# Patient Record
Sex: Female | Born: 1970 | Race: White | Hispanic: No | Marital: Married | Smoking: Never smoker
Health system: Southern US, Community
[De-identification: ages and names within clinical notes are randomized; demographics above are authoritative.]

## PROBLEM LIST (undated history)

## (undated) DIAGNOSIS — F410 Panic disorder [episodic paroxysmal anxiety] without agoraphobia: Secondary | ICD-10-CM

## (undated) DIAGNOSIS — R42 Dizziness and giddiness: Secondary | ICD-10-CM

## (undated) DIAGNOSIS — G250 Essential tremor: Principal | ICD-10-CM

## (undated) DIAGNOSIS — G2581 Restless legs syndrome: Secondary | ICD-10-CM

## (undated) DIAGNOSIS — F32A Depression, unspecified: Secondary | ICD-10-CM

## (undated) DIAGNOSIS — G43909 Migraine, unspecified, not intractable, without status migrainosus: Secondary | ICD-10-CM

## (undated) DIAGNOSIS — J45909 Unspecified asthma, uncomplicated: Secondary | ICD-10-CM

## (undated) HISTORY — PX: ABLATION ON ENDOMETRIOSIS: SHX5787

## (undated) HISTORY — DX: Unspecified asthma, uncomplicated: J45.909

## (undated) HISTORY — DX: Restless legs syndrome: G25.81

## (undated) HISTORY — PX: MYRINGOTOMY WITH TUBE PLACEMENT: SHX5663

## (undated) HISTORY — DX: Essential tremor: G25.0

## (undated) HISTORY — DX: Migraine, unspecified, not intractable, without status migrainosus: G43.909

## (undated) HISTORY — PX: TONSILLECTOMY: SUR1361

## (undated) HISTORY — DX: Dizziness and giddiness: R42

## (undated) HISTORY — DX: Panic disorder (episodic paroxysmal anxiety): F41.0

## (undated) HISTORY — DX: Depression, unspecified: F32.A

---

## 1997-10-13 ENCOUNTER — Inpatient Hospital Stay (HOSPITAL_COMMUNITY): Admission: AD | Admit: 1997-10-13 | Discharge: 1997-10-16 | Payer: Self-pay | Admitting: Obstetrics & Gynecology

## 1997-10-17 ENCOUNTER — Encounter (HOSPITAL_COMMUNITY): Admission: RE | Admit: 1997-10-17 | Discharge: 1998-01-15 | Payer: Self-pay | Admitting: Obstetrics & Gynecology

## 1998-11-28 ENCOUNTER — Other Ambulatory Visit: Admission: RE | Admit: 1998-11-28 | Discharge: 1998-11-28 | Payer: Self-pay | Admitting: Obstetrics & Gynecology

## 1998-12-10 ENCOUNTER — Other Ambulatory Visit: Admission: RE | Admit: 1998-12-10 | Discharge: 1998-12-10 | Payer: Self-pay | Admitting: Obstetrics & Gynecology

## 1999-11-04 ENCOUNTER — Other Ambulatory Visit: Admission: RE | Admit: 1999-11-04 | Discharge: 1999-11-04 | Payer: Self-pay | Admitting: Obstetrics & Gynecology

## 2000-02-18 ENCOUNTER — Inpatient Hospital Stay (HOSPITAL_COMMUNITY): Admission: AD | Admit: 2000-02-18 | Discharge: 2000-02-18 | Payer: Self-pay | Admitting: Obstetrics and Gynecology

## 2000-04-24 ENCOUNTER — Observation Stay (HOSPITAL_COMMUNITY): Admission: AD | Admit: 2000-04-24 | Discharge: 2000-04-25 | Payer: Self-pay | Admitting: Obstetrics and Gynecology

## 2000-05-21 ENCOUNTER — Inpatient Hospital Stay (HOSPITAL_COMMUNITY): Admission: AD | Admit: 2000-05-21 | Discharge: 2000-05-21 | Payer: Self-pay | Admitting: Obstetrics and Gynecology

## 2000-05-30 ENCOUNTER — Inpatient Hospital Stay (HOSPITAL_COMMUNITY): Admission: AD | Admit: 2000-05-30 | Discharge: 2000-06-01 | Payer: Self-pay | Admitting: Obstetrics & Gynecology

## 2000-07-06 ENCOUNTER — Other Ambulatory Visit: Admission: RE | Admit: 2000-07-06 | Discharge: 2000-07-06 | Payer: Self-pay | Admitting: Obstetrics & Gynecology

## 2001-07-19 ENCOUNTER — Other Ambulatory Visit: Admission: RE | Admit: 2001-07-19 | Discharge: 2001-07-19 | Payer: Self-pay | Admitting: Obstetrics & Gynecology

## 2002-11-28 ENCOUNTER — Inpatient Hospital Stay (HOSPITAL_COMMUNITY): Admission: AD | Admit: 2002-11-28 | Discharge: 2002-11-28 | Payer: Self-pay | Admitting: *Deleted

## 2002-12-08 ENCOUNTER — Inpatient Hospital Stay (HOSPITAL_COMMUNITY): Admission: AD | Admit: 2002-12-08 | Discharge: 2002-12-09 | Payer: Self-pay | Admitting: Obstetrics & Gynecology

## 2003-05-09 ENCOUNTER — Emergency Department (HOSPITAL_COMMUNITY): Admission: EM | Admit: 2003-05-09 | Discharge: 2003-05-09 | Payer: Self-pay | Admitting: *Deleted

## 2003-07-06 ENCOUNTER — Inpatient Hospital Stay (HOSPITAL_COMMUNITY): Admission: RE | Admit: 2003-07-06 | Discharge: 2003-07-09 | Payer: Self-pay | Admitting: Obstetrics & Gynecology

## 2003-08-18 ENCOUNTER — Other Ambulatory Visit: Admission: RE | Admit: 2003-08-18 | Discharge: 2003-08-18 | Payer: Self-pay | Admitting: Obstetrics & Gynecology

## 2004-09-25 ENCOUNTER — Other Ambulatory Visit: Admission: RE | Admit: 2004-09-25 | Discharge: 2004-09-25 | Payer: Self-pay | Admitting: Obstetrics & Gynecology

## 2006-04-13 ENCOUNTER — Emergency Department (HOSPITAL_COMMUNITY): Admission: EM | Admit: 2006-04-13 | Discharge: 2006-04-13 | Payer: Self-pay | Admitting: Family Medicine

## 2006-04-14 ENCOUNTER — Inpatient Hospital Stay (HOSPITAL_COMMUNITY): Admission: EM | Admit: 2006-04-14 | Discharge: 2006-04-17 | Payer: Self-pay | Admitting: Emergency Medicine

## 2010-05-28 ENCOUNTER — Encounter: Payer: Self-pay | Admitting: Family Medicine

## 2010-05-28 ENCOUNTER — Ambulatory Visit (INDEPENDENT_AMBULATORY_CARE_PROVIDER_SITE_OTHER): Payer: 59 | Admitting: Family Medicine

## 2010-05-28 VITALS — BP 108/73 | HR 60 | Temp 97.5°F | Ht 68.0 in | Wt 200.0 lb

## 2010-05-28 DIAGNOSIS — M25579 Pain in unspecified ankle and joints of unspecified foot: Secondary | ICD-10-CM

## 2010-05-28 DIAGNOSIS — M25571 Pain in right ankle and joints of right foot: Secondary | ICD-10-CM

## 2010-05-28 NOTE — Patient Instructions (Signed)
You have a severe ankle sprain. Your ultrasound was negative for a subtle fracture or tendon tears about the ankle. Ice the area for 15 minutes at a time, 3-4 times a day Take aleve 2 tabs twice a day with food for 1 week then as needed. Elevate above the level of your heart when possible Crutches if needed to help with walking Bear weight when tolerated Depending on severity your doctor will place you in an ACE wrap with a walking boot OR a laceup ankle brace to help with stability while you recover from this injury. Come out of the boot/brace twice a day to do Up/down and alphabet exercises 2-3 sets of each. Consider physical therapy for strengthening and balance exercises when you follow up with [place]. If not improving as expected, we may repeat x-rays or consider further testing like an MRI. Follow up with me in 10 days.

## 2010-05-29 ENCOUNTER — Encounter: Payer: Self-pay | Admitting: Family Medicine

## 2010-05-29 DIAGNOSIS — M25571 Pain in right ankle and joints of right foot: Secondary | ICD-10-CM | POA: Insufficient documentation

## 2010-05-29 NOTE — Assessment & Plan Note (Signed)
x-rays at outside facility negative for fracture and ultrasound also negative today for evidence of fracture or tendon rupture.  Clinically consistent with severe ankle sprain as well.  Unable to bear weight comfortably.  Start with cam walker and crutches though instructed to not wear walker at night and to come out of this for icing and to do simple motion exercises daily.  NSAIDs for pain, swelling, and inflammation.  Will see back in 7-10 days for recheck and hopefully transition to ASO with home exercise program vs PT.  See instructions for further.

## 2010-05-29 NOTE — Progress Notes (Signed)
Subjective:    Patient ID: Pamela Little, female    DOB: 07-18-1970, 40 y.o.   MRN: [id number]  HPI 40 yo F here with right ankle injury.  Patient reports she was playing tennis yesterday 05/27/10 when she rolled/rocked her right ankle both ways causing pain on lateral and medial aspects of ankle. Was able to bear weight immediately after and could continue playing tennis though had to stop after several minutes. + Swelling and bruising No prior ankle sprains or injuries. Went to PCP at [facility] and had x-rays that were negative - was given an ASO but it was too large for her. Limping 2/2 pain and swelling.  History reviewed. No pertinent past medical history.  No current outpatient prescriptions on file prior to visit.    History reviewed. No pertinent past surgical history.  No Known Allergies  History   Social History  . Marital Status: Married    Spouse Name: N/A    Number of Children: N/A  . Years of Education: N/A   Occupational History  . Not on file.   Social History Main Topics  . Smoking status: Never Smoker   . Smokeless tobacco: Not on file  . Alcohol Use: No  . Drug Use: Not on file  . Sexually Active: Not on file   Other Topics Concern  . Not on file   Social History Narrative  . No narrative on file    Family History  Problem Relation Age of Onset  . Hypertension Mother   . Heart attack Neg Hx   . Diabetes Neg Hx     BP 108/73  Pulse 60  Temp(Src) 97.5 F (36.4 C) (Oral)  Ht 5\' 8"  (1.727 m)  Wt 200 lb (90.719 kg)  BMI 30.41 kg/m2  Review of Systems See HPI above.    Objective:   Physical Exam Gen: NAD R ankle: Mod swelling and bruising from ankle distally into dorsal foot.  No redness, warmth. TTP greatest over ATFL, ankle joint anteriorly, medial and posterior malleoli.  No TTP fibular head, base 5th, navicular, or elsewhere about foot. Has active range of motion in all planes but moderately decreased in every one.  Strength 5-/5  with internal and external rotation, 5/5 with dorsiflexion and plantarflexion - pain with all motions. 1+ ant drawer compared to negative on left.  Negative talar tilt.  Somewhat difficult exam 2/2 swelling. Negative thompsons, achilles palpated and intact. NVI distally with 2+ dp pulse Able to do toe raise on ground.  L ankle:  FROM without pain, swelling, weakness, or instability.     MSK u/s:  Full right ankle ultrasound performed.  No evidence of bony irregularity, edema overlying cortex, or neovascularity at medial malleolus, talar dome, or lateral malleolus.  Soft tissue edema readily apparent throughout ankle.  Target sign within extensor digitorum sheath.  Peroneal tendons and TDH tendons medially appear intact throughout their courses with increased neovascularity.    Assessment & Plan:  1. Severe right ankle sprain - x-rays at outside facility negative for fracture and ultrasound also negative today for evidence of fracture or tendon rupture.  Clinically consistent with severe ankle sprain as well.  Unable to bear weight comfortably.  Start with cam walker and crutches though instructed to not wear walker at night and to come out of this for icing and to do simple motion exercises daily.  NSAIDs for pain, swelling, and inflammation.  Will see back in 7-10 days for recheck and hopefully transition to  ASO with home exercise program vs PT.  See instructions for further.

## 2010-06-07 ENCOUNTER — Encounter: Payer: Self-pay | Admitting: Family Medicine

## 2010-06-07 ENCOUNTER — Ambulatory Visit (HOSPITAL_BASED_OUTPATIENT_CLINIC_OR_DEPARTMENT_OTHER)
Admission: RE | Admit: 2010-06-07 | Discharge: 2010-06-07 | Disposition: A | Discharge status: Home / Self Care | Payer: 59 | Source: Ambulatory Visit | Attending: Family Medicine | Admitting: Family Medicine

## 2010-06-07 ENCOUNTER — Ambulatory Visit (INDEPENDENT_AMBULATORY_CARE_PROVIDER_SITE_OTHER): Payer: 59 | Admitting: Family Medicine

## 2010-06-07 VITALS — BP 109/71 | HR 55 | Temp 97.4°F | Ht 68.0 in | Wt 200.0 lb

## 2010-06-07 DIAGNOSIS — M25571 Pain in right ankle and joints of right foot: Secondary | ICD-10-CM

## 2010-06-07 DIAGNOSIS — Z4789 Encounter for other orthopedic aftercare: Secondary | ICD-10-CM | POA: Insufficient documentation

## 2010-06-07 DIAGNOSIS — M25579 Pain in unspecified ankle and joints of unspecified foot: Secondary | ICD-10-CM | POA: Insufficient documentation

## 2010-06-07 DIAGNOSIS — R609 Edema, unspecified: Secondary | ICD-10-CM | POA: Insufficient documentation

## 2010-06-07 NOTE — Patient Instructions (Signed)
Start physical therapy for your ankle 2x/week for up to 4 weeks. Ice the area for 15 minutes at a time 3-4 times a day. Aleve 2 tabs twice a day with food for pain and inflammation. Wear laceup ankle brace when up and walking around. Ok to use crutches if needed. Discontinue the boot. Follow up with me in 2 weeks for recheck.

## 2010-06-08 ENCOUNTER — Encounter: Payer: Self-pay | Admitting: Family Medicine

## 2010-06-08 NOTE — Progress Notes (Signed)
  Subjective:    Patient ID: Pamela Little, female    DOB: 08-Oct-1970, 40 y.o.   MRN: [id number]  HPI  40 yo F here for 10 day f/u right ankle sprain.  Patient reports she was playing tennis 05/27/10 when she rolled/rocked her right ankle both ways causing pain on lateral and medial aspects of ankle. Was able to bear weight immediately after and could continue playing tennis though had to stop after several minutes. + Swelling and bruising No prior ankle sprains or injuries. Went to PCP at [facility] and had x-rays that were negative - was given an ASO but it was too large for her. Limping 2/2 pain and swelling. At last OV because having a lot of difficulty bearing weight, normal u/s, was placed in cam walker with crutches. Elevating, icing, aleve.  Pain much improved, now 2/10 and can bear weight in boot somewhat comfortably but still has a lot of pain medially in ankle at times. Coming out of boot to do rom exercises.  History reviewed. No pertinent past medical history.  No current outpatient prescriptions on file prior to visit.    History reviewed. No pertinent past surgical history.  No Known Allergies  History   Social History  . Marital Status: Married    Spouse Name: N/A    Number of Children: N/A  . Years of Education: N/A   Occupational History  . Not on file.   Social History Main Topics  . Smoking status: Never Smoker   . Smokeless tobacco: Not on file  . Alcohol Use: No  . Drug Use: Not on file  . Sexually Active: Not on file   Other Topics Concern  . Not on file   Social History Narrative  . No narrative on file    Family History  Problem Relation Age of Onset  . Hypertension Mother   . Heart attack Neg Hx   . Diabetes Neg Hx     BP 109/71  Pulse 55  Temp(Src) 97.4 F (36.3 C) (Oral)  Ht 5\' 8"  (1.727 m)  Wt 200 lb (90.719 kg)  BMI 30.41 kg/m2  Review of Systems  See HPI above.    Objective:   Physical Exam  Gen: NAD R ankle: Mod  swelling and bruising from distal 1/3rd lower leg distally into dorsal foot though mildly improved.  No redness, warmth. TTP greatest over bruising but also at medial malleolus and posterior to this.  No TTP lat malleolus, fibular head, base 5th, navicular, or elsewhere about foot. Has active range of motion in all planes but mildly decreased in every one.  Strength 5-/5 with internal and external rotation, 5/5 with dorsiflexion and plantarflexion - pain with int and ext rotation. 1+ ant drawer compared to negative on left.  Negative talar tilt.  Somewhat difficult exam 2/2 swelling. Negative thompsons, achilles palpated and intact. NVI distally with 2+ dp pulse  L ankle:  FROM without pain, swelling, weakness, or instability.     Assessment & Plan:  1. Severe right ankle sprain - repeated x-rays given persistent tenderness at medial malleolus - no evidence of fracture.  Neovascularity and swelling diffuse on ultrasound medially.  No apparent fracture on ultrasound either.  Clinically improving.  Add formal PT over next 2 weeks, transition to ASO with crutches only as needed.  NSAIDs for pain, swelling, and inflammation.  F/u in 2 weeks. See instructions for further.

## 2010-06-08 NOTE — Assessment & Plan Note (Signed)
Severe right ankle sprain - repeated x-rays given persistent tenderness at medial malleolus - no evidence of fracture.  Neovascularity and swelling diffuse on ultrasound medially.  No apparent fracture on ultrasound either.  Clinically improving.  Add formal PT over next 2 weeks, transition to ASO with crutches only as needed.  NSAIDs for pain, swelling, and inflammation.  F/u in 2 weeks. See instructions for further.

## 2010-06-12 ENCOUNTER — Ambulatory Visit: Payer: 59 | Attending: Family Medicine | Admitting: Physical Therapy

## 2010-06-12 DIAGNOSIS — M25579 Pain in unspecified ankle and joints of unspecified foot: Secondary | ICD-10-CM | POA: Insufficient documentation

## 2010-06-12 DIAGNOSIS — IMO0001 Reserved for inherently not codable concepts without codable children: Secondary | ICD-10-CM | POA: Insufficient documentation

## 2010-06-12 DIAGNOSIS — M25673 Stiffness of unspecified ankle, not elsewhere classified: Secondary | ICD-10-CM | POA: Insufficient documentation

## 2010-06-12 DIAGNOSIS — M25676 Stiffness of unspecified foot, not elsewhere classified: Secondary | ICD-10-CM | POA: Insufficient documentation

## 2010-06-17 ENCOUNTER — Ambulatory Visit: Payer: 59 | Admitting: Physical Therapy

## 2010-06-19 ENCOUNTER — Ambulatory Visit: Payer: 59 | Attending: Family Medicine | Admitting: Rehabilitation

## 2010-06-19 DIAGNOSIS — M25676 Stiffness of unspecified foot, not elsewhere classified: Secondary | ICD-10-CM | POA: Insufficient documentation

## 2010-06-19 DIAGNOSIS — IMO0001 Reserved for inherently not codable concepts without codable children: Secondary | ICD-10-CM | POA: Insufficient documentation

## 2010-06-19 DIAGNOSIS — M25579 Pain in unspecified ankle and joints of unspecified foot: Secondary | ICD-10-CM | POA: Insufficient documentation

## 2010-06-19 DIAGNOSIS — M25673 Stiffness of unspecified ankle, not elsewhere classified: Secondary | ICD-10-CM | POA: Insufficient documentation

## 2010-06-21 ENCOUNTER — Ambulatory Visit: Payer: 59 | Admitting: Family Medicine

## 2010-06-25 ENCOUNTER — Ambulatory Visit: Payer: 59 | Admitting: Physical Therapy

## 2010-06-25 ENCOUNTER — Ambulatory Visit (INDEPENDENT_AMBULATORY_CARE_PROVIDER_SITE_OTHER): Payer: 59 | Admitting: Family Medicine

## 2010-06-25 ENCOUNTER — Encounter: Payer: Self-pay | Admitting: Family Medicine

## 2010-06-25 VITALS — BP 105/75 | HR 72 | Temp 97.5°F | Ht 68.0 in | Wt 200.0 lb

## 2010-06-25 DIAGNOSIS — M25579 Pain in unspecified ankle and joints of unspecified foot: Secondary | ICD-10-CM

## 2010-06-25 DIAGNOSIS — M25571 Pain in right ankle and joints of right foot: Secondary | ICD-10-CM

## 2010-06-25 NOTE — Assessment & Plan Note (Signed)
Patient is clinically improving though still with pain mainly at medial malleolus.  Likely represents contusion (initial and repeat x-rays and ultrasound negative for fracture, no callus palpated, is improving and can walk without limp).  Expected her course to be long given severity of sprain, swelling and bruising.  Continue with PT - add ionto.  Stressed need to do home exercises and ice (not doing these regularly) to get back to regular activities faster.  Continue ASO.  Will f/u in 10-14 days for reevaluation - anticipate starting cutting activities, back to tennis if continues to clinically improve.  We discussed considering MRI if not continuing to improve.

## 2010-06-25 NOTE — Progress Notes (Signed)
Subjective:    Patient ID: Pamela Little, female    DOB: 02/20/1970, 40 y.o.   MRN: [id number]  HPI  40 yo F here for 4 week f/u right ankle sprain.  Last OV: Patient reports she was playing tennis 05/27/10 when she rolled/rocked her right ankle both ways causing pain on lateral and medial aspects of ankle. Was able to bear weight immediately after and could continue playing tennis though had to stop after several minutes. + Swelling and bruising No prior ankle sprains or injuries. Went to PCP at [facility] and had x-rays that were negative - was given an ASO but it was too large for her. Was limping 2/2 pain and swelling. At last OV because having a lot of difficulty bearing weight, normal u/s, was placed in cam walker with crutches. Elevating, icing, aleve.  Pain much improved, now 2/10 and can bear weight in boot somewhat comfortably but still has a lot of pain medially in ankle at times. Coming out of boot to do rom exercises.  Today: Patient reports pain currently is 1/10 (just came from PT). Feels much better and able to walk without limping. Wearing ASO regularly when walking around. Still has pain up to 3-4/10 when pressing on inside of right ankle (at medial malleolus). Not icing regularly Still swollen but bruising has resolved. No longer needs crutches. Doing some elliptical and side to side work in PT. Initial and repeat x-rays (and ultrasound) negative for fracture.  History reviewed. No pertinent past medical history.  Current Outpatient Prescriptions on File Prior to Visit  Medication Sig Dispense Refill  . sertraline (ZOLOFT) 100 MG tablet         History reviewed. No pertinent past surgical history.  No Known Allergies  History   Social History  . Marital Status: Married    Spouse Name: N/A    Number of Children: N/A  . Years of Education: N/A   Occupational History  . Not on file.   Social History Main Topics  . Smoking status: Never Smoker   .  Smokeless tobacco: Not on file  . Alcohol Use: No  . Drug Use: Not on file  . Sexually Active: Not on file   Other Topics Concern  . Not on file   Social History Narrative  . No narrative on file    Family History  Problem Relation Age of Onset  . Hypertension Mother   . Heart attack Neg Hx   . Diabetes Neg Hx     BP 105/75  Pulse 72  Temp(Src) 97.5 F (36.4 C) (Oral)  Ht 5\' 8"  (1.727 m)  Wt 200 lb (90.719 kg)  BMI 30.41 kg/m2  Review of Systems  See HPI above.    Objective:   Physical Exam  Gen: NAD R ankle: Mod swelling at ankle only - resolved from lower leg.  No bruising.  No redness, warmth. Mild TTP focally at medial malleolus but no callus, crepitation at this area.  Also with TTP over ATFL.  No TTP lat malleolus, fibular head, base 5th, navicular, or elsewhere about foot. Has active range of motion in all planes but mildly decreased in every one.  Strength 5-/5 with internal and external rotation, 5/5 with dorsiflexion and plantarflexion - mild pain with ext rotation. 1+ ant drawer compared to negative on left.  Negative talar tilt. Negative thompsons, achilles palpated and intact. NVI distally with 2+ dp pulse  L ankle:  FROM without pain, swelling, weakness, or instability.  MSK u/s: Transverse and longitudinal views obtained of medial malleolus and compared to left ankle.  Appearance of bony cortex appears similar bilaterally.  No edema overlying cortex.  More neovascularity noted on injured side, however.  Assessment & Plan:  1. Severe right ankle sprain - Patient is clinically improving though still with pain mainly at medial malleolus.  Likely represents contusion (initial and repeat x-rays and ultrasound negative for fracture, no callus palpated, is improving and can walk without limp).  Expected her course to be long given severity of sprain, swelling and bruising.  Continue with PT - add ionto.  Stressed need to do home exercises and ice (not doing  these regularly) to get back to regular activities faster.  Continue ASO.  Will f/u in 10-14 days for reevaluation - anticipate starting cutting activities, back to tennis if continues to clinically improve.  We discussed considering MRI if not continuing to improve.

## 2010-06-28 ENCOUNTER — Encounter: Payer: 59 | Admitting: Physical Therapy

## 2010-07-02 ENCOUNTER — Ambulatory Visit: Payer: 59 | Admitting: Physical Therapy

## 2010-07-04 ENCOUNTER — Ambulatory Visit: Payer: 59 | Admitting: Physical Therapy

## 2010-07-08 ENCOUNTER — Ambulatory Visit (INDEPENDENT_AMBULATORY_CARE_PROVIDER_SITE_OTHER): Payer: 59 | Admitting: Family Medicine

## 2010-07-08 ENCOUNTER — Encounter: Payer: Self-pay | Admitting: Family Medicine

## 2010-07-08 VITALS — BP 103/68 | HR 57 | Temp 97.4°F | Ht 68.0 in | Wt 200.0 lb

## 2010-07-08 DIAGNOSIS — M25571 Pain in right ankle and joints of right foot: Secondary | ICD-10-CM

## 2010-07-08 DIAGNOSIS — M25579 Pain in unspecified ankle and joints of unspecified foot: Secondary | ICD-10-CM

## 2010-07-08 NOTE — Assessment & Plan Note (Signed)
Severe right ankle sprain - Swelling is primary issue currently though pain and activity level have improved dramatically.  Repeat x-rays and ultrasound have been negative.  Continue with PT for at least 2 more weeks and continue with home exercise program.  Add more agility work and ok to ease back into tennis now while wearing ASO.  As she is clinically improved despite swelling, do not feel need to pursue additional imaging at this time unless pain worsens or recurs.  F/u in 3-4 weeks for recheck, sooner if she has any problems.

## 2010-07-08 NOTE — Progress Notes (Signed)
Subjective:    Patient ID: Pamela Little, female    DOB: 09-Jul-1970, 40 y.o.   MRN: [id number]  HPI  40 yo F here for 3 week f/u right ankle sprain (total 7 weeks out).  Initial OV: Patient reports she was playing tennis 05/27/10 when she rolled/rocked her right ankle both ways causing pain on lateral and medial aspects of ankle. Was able to bear weight immediately after and could continue playing tennis though had to stop after several minutes. + Swelling and bruising No prior ankle sprains or injuries. Went to PCP at [facility] and had x-rays that were negative - was given an ASO but it was too large for her. Was limping 2/2 pain and swelling. At last OV because having a lot of difficulty bearing weight, normal u/s, was placed in cam walker with crutches. Elevating, icing, aleve.  Pain much improved, now 2/10 and can bear weight in boot somewhat comfortably but still has a lot of pain medially in ankle at times. Coming out of boot to do rom exercises.  Last OV (4 weeks after initial): Patient reports pain currently is 1/10 (just came from PT). Feels much better and able to walk without limping. Wearing ASO regularly when walking around. Still has pain up to 3-4/10 when pressing on inside of right ankle (at medial malleolus). Not icing regularly Still swollen but bruising has resolved. No longer needs crutches. Doing some elliptical and side to side work in PT. Initial and repeat x-rays (and ultrasound) negative for fracture.  Today: Patient continues to improve. Only pain is when pressing on medial malleolus. Has been able to do elliptical, bike, jog, do agility work in PT though sore after this. Swelling is main issue - worse when on feet and after she is active. Using ASO for activities though not back to tennis yet. Continuing with PT and ionto.  History reviewed. No pertinent past medical history.  Current Outpatient Prescriptions on File Prior to Visit  Medication Sig  Dispense Refill  . clonazePAM (KLONOPIN) 0.5 MG tablet       . sertraline (ZOLOFT) 100 MG tablet         History reviewed. No pertinent past surgical history.  No Known Allergies  History   Social History  . Marital Status: Married    Spouse Name: N/A    Number of Children: N/A  . Years of Education: N/A   Occupational History  . Not on file.   Social History Main Topics  . Smoking status: Never Smoker   . Smokeless tobacco: Not on file  . Alcohol Use: No  . Drug Use: Not on file  . Sexually Active: Not on file   Other Topics Concern  . Not on file   Social History Narrative  . No narrative on file    Family History  Problem Relation Age of Onset  . Hypertension Mother   . Heart attack Neg Hx   . Diabetes Neg Hx     BP 103/68  Pulse 57  Temp(Src) 97.4 F (36.3 C) (Oral)  Ht 5\' 8"  (1.727 m)  Wt 200 lb (90.719 kg)  BMI 30.41 kg/m2  Review of Systems  See HPI above.    Objective:   Physical Exam  Gen: NAD R ankle: Mod swelling at ankle only.  No bruising.  No redness, warmth. Mild TTP focally at medial malleolus but no callus, crepitation at this area.  Also with TTP over ATFL.  No TTP lat malleolus, fibular head,  base 5th, navicular, or elsewhere about foot. FROM all planes. Strength 5-/5 with external rotation, 5/5 internal rotation, dorsiflexion and plantarflexion - mild pain with ext rotation. 1+ ant drawer compared to negative on left.  Negative talar tilt. Negative thompsons, achilles palpated and intact. NVI distally with 2+ dp pulse  L ankle:  FROM without pain, swelling, weakness, or instability.     Assessment & Plan:  1. Severe right ankle sprain - Swelling is primary issue currently though pain and activity level have improved dramatically.  Repeat x-rays and ultrasound have been negative.  Continue with PT for at least 2 more weeks and continue with home exercise program.  Add more agility work and ok to ease back into tennis now while  wearing ASO.  As she is clinically improved despite swelling, do not feel need to pursue additional imaging at this time unless pain worsens or recurs.  F/u in 3-4 weeks for recheck, sooner if she has any problems.

## 2010-07-09 ENCOUNTER — Ambulatory Visit: Payer: 59 | Admitting: Family Medicine

## 2010-07-09 ENCOUNTER — Ambulatory Visit: Payer: 59 | Admitting: Physical Therapy

## 2010-07-16 ENCOUNTER — Encounter: Payer: 59 | Admitting: Physical Therapy

## 2010-07-23 ENCOUNTER — Ambulatory Visit: Payer: 59 | Attending: Family Medicine | Admitting: Physical Therapy

## 2010-07-23 DIAGNOSIS — M25676 Stiffness of unspecified foot, not elsewhere classified: Secondary | ICD-10-CM | POA: Insufficient documentation

## 2010-07-23 DIAGNOSIS — M25673 Stiffness of unspecified ankle, not elsewhere classified: Secondary | ICD-10-CM | POA: Insufficient documentation

## 2010-07-23 DIAGNOSIS — M25579 Pain in unspecified ankle and joints of unspecified foot: Secondary | ICD-10-CM | POA: Insufficient documentation

## 2010-07-23 DIAGNOSIS — IMO0001 Reserved for inherently not codable concepts without codable children: Secondary | ICD-10-CM | POA: Insufficient documentation

## 2010-07-30 ENCOUNTER — Encounter: Payer: 59 | Admitting: Physical Therapy

## 2010-07-31 ENCOUNTER — Encounter: Payer: Self-pay | Admitting: Family Medicine

## 2010-07-31 ENCOUNTER — Ambulatory Visit (INDEPENDENT_AMBULATORY_CARE_PROVIDER_SITE_OTHER): Payer: 59 | Admitting: Family Medicine

## 2010-07-31 VITALS — BP 111/74 | HR 50 | Temp 97.7°F | Ht 68.0 in | Wt 200.0 lb

## 2010-07-31 DIAGNOSIS — M25571 Pain in right ankle and joints of right foot: Secondary | ICD-10-CM

## 2010-07-31 DIAGNOSIS — M25579 Pain in unspecified ankle and joints of unspecified foot: Secondary | ICD-10-CM

## 2010-08-01 ENCOUNTER — Encounter: Payer: Self-pay | Admitting: Family Medicine

## 2010-08-01 NOTE — Progress Notes (Signed)
Subjective:    Patient ID: Pamela Little, female    DOB: March 14, 1970, 40 y.o.   MRN: [id number]  HPI  40 yo F here for 3 week f/u right ankle sprain (total 7 weeks out).  Initial OV: Patient reports she was playing tennis 05/27/10 when she rolled/rocked her right ankle both ways causing pain on lateral and medial aspects of ankle. Was able to bear weight immediately after and could continue playing tennis though had to stop after several minutes. + Swelling and bruising No prior ankle sprains or injuries. Went to PCP at [facility] and had x-rays that were negative - was given an ASO but it was too large for her. Was limping 2/2 pain and swelling. At last OV because having a lot of difficulty bearing weight, normal u/s, was placed in cam walker with crutches. Elevating, icing, aleve.  Pain much improved, now 2/10 and can bear weight in boot somewhat comfortably but still has a lot of pain medially in ankle at times. Coming out of boot to do rom exercises.  Last OV (4 weeks after initial): Patient reports pain currently is 1/10 (just came from PT). Feels much better and able to walk without limping. Wearing ASO regularly when walking around. Still has pain up to 3-4/10 when pressing on inside of right ankle (at medial malleolus). Not icing regularly Still swollen but bruising has resolved. No longer needs crutches. Doing some elliptical and side to side work in PT. Initial and repeat x-rays (and ultrasound) negative for fracture.  3 week follow up: Patient continues to improve. Only pain is when pressing on medial malleolus. Has been able to do elliptical, bike, jog, do agility work in PT though sore after this. Swelling is main issue - worse when on feet and after she is active. Using ASO for activities though not back to tennis yet. Continuing with PT and ionto.  40 yo F here for: Patient has been able to return to tennis and play well though she is still tentative. Advised in PT that she  still has some mild strengthening to do with external rotation but otherwise doing well. Gets intermittent pain medial malleolus - x-rays x 2 and ultrasound did not show a fracture in this region. Swelling persists though is improved - better overnight but worse after playing tennis.  Does not limit her. Has an ASO she wears regularly with tennis.  Compliant with HEP.  Would like another ASO because she plays on clay courts also and ASO getting dirty.  History reviewed. No pertinent past medical history.  Current Outpatient Prescriptions on File Prior to Visit  Medication Sig Dispense Refill  . clonazePAM (KLONOPIN) 0.5 MG tablet       . sertraline (ZOLOFT) 100 MG tablet         History reviewed. No pertinent past surgical history.  No Known Allergies  History   Social History  . Marital Status: Married    Spouse Name: N/A    Number of Children: N/A  . Years of Education: N/A   Occupational History  . Not on file.   Social History Main Topics  . Smoking status: Never Smoker   . Smokeless tobacco: Not on file  . Alcohol Use: No  . Drug Use: Not on file  . Sexually Active: Not on file   Other Topics Concern  . Not on file   Social History Narrative  . No narrative on file    Family History  Problem Relation Age of Onset  . Hypertension  Mother   . Heart attack Neg Hx   . Diabetes Neg Hx     BP 111/74  Pulse 50  Temp(Src) 97.7 F (36.5 C) (Oral)  Ht 5\' 8"  (1.727 m)  Wt 200 lb (90.719 kg)  BMI 30.41 kg/m2  Review of Systems  See HPI above.    Objective:   Physical Exam  Gen: NAD R ankle: Mild swelling at ankle only but appears improved.  No bruising.  No redness, warmth. Minimal TTP focally at medial malleolus but no callus, crepitation at this area.  No longer tender over ATFL.  No TTP lat malleolus, fibular head, base 5th, navicular, or elsewhere about foot. FROM all planes. Strength 5-/5 with external rotation, 5/5 internal rotation, dorsiflexion and  plantarflexion - no pain with ext rotation. 1+ ant drawer compared to negative on left.  Negative talar tilt. Negative thompsons, achilles palpated and intact. NVI distally with 2+ dp pulse  L ankle: FROM without pain, swelling, weakness, or instability.     Assessment & Plan:  1. Severe right ankle sprain - Continues to improve and now back to tennis (though tentative, does not have more pain with activity and near her usual level of activity/competition).  Continues to have mild swelling which I advised her is usually the last thing to resolve with a severe ankle sprain.  Continue home exercises, icing.  ASO with sports.  F/u prn.

## 2010-08-01 NOTE — Assessment & Plan Note (Signed)
Severe right ankle sprain - Continues to improve and now back to tennis (though tentative, does not have more pain with activity and near her usual level of activity/competition).  Continues to have mild swelling which I advised her is usually the last thing to resolve with a severe ankle sprain.  Continue home exercises, icing.  ASO with sports.  F/u prn.

## 2011-01-15 ENCOUNTER — Ambulatory Visit (INDEPENDENT_AMBULATORY_CARE_PROVIDER_SITE_OTHER): Payer: 59 | Admitting: Family Medicine

## 2011-01-15 ENCOUNTER — Encounter: Payer: Self-pay | Admitting: Family Medicine

## 2011-01-15 VITALS — BP 98/67 | HR 54 | Temp 97.8°F | Ht 68.0 in | Wt 195.0 lb

## 2011-01-15 DIAGNOSIS — M25571 Pain in right ankle and joints of right foot: Secondary | ICD-10-CM

## 2011-01-15 DIAGNOSIS — M25579 Pain in unspecified ankle and joints of unspecified foot: Secondary | ICD-10-CM

## 2011-01-15 NOTE — Progress Notes (Addendum)
Subjective:    Patient ID: Pamela Little, female    DOB: 1971/01/18, 40 y.o.   MRN: [id number]  Ankle Pain    40 yo F here for right ankle pain.  4/10: Patient reports she was playing tennis 05/27/10 when she rolled/rocked her right ankle both ways causing pain on lateral and medial aspects of ankle. Was able to bear weight immediately after and could continue playing tennis though had to stop after several minutes. + Swelling and bruising No prior ankle sprains or injuries. Went to PCP at [facility] and had x-rays that were negative - was given an ASO but it was too large for her. Was limping 2/2 pain and swelling. At last OV because having a lot of difficulty bearing weight, normal u/s, was placed in cam walker with crutches. Elevating, icing, aleve.  Pain much improved, now 2/10 and can bear weight in boot somewhat comfortably but still has a lot of pain medially in ankle at times. Coming out of boot to do rom exercises.  5/8: Patient reports pain currently is 1/10 (just came from PT). Feels much better and able to walk without limping. Wearing ASO regularly when walking around. Still has pain up to 3-4/10 when pressing on inside of right ankle (at medial malleolus). Not icing regularly Still swollen but bruising has resolved. No longer needs crutches. Doing some elliptical and side to side work in PT. Initial and repeat x-rays (and ultrasound) negative for fracture.  5/21: Patient continues to improve. Only pain is when pressing on medial malleolus. Has been able to do elliptical, bike, jog, do agility work in PT though sore after this. Swelling is main issue - worse when on feet and after she is active. Using ASO for activities though not back to tennis yet. Continuing with PT and ionto.  6/13: Patient has been able to return to tennis and play well though she is still tentative. Advised in PT that she still has some mild strengthening to do with external rotation but  otherwise doing well. Gets intermittent pain medial malleolus - x-rays x 2 and ultrasound did not show a fracture in this region. Swelling persists though is improved - better overnight but worse after playing tennis.  Does not limit her. Has an ASO she wears regularly with tennis.  Compliant with HEP.  Would like another ASO because she plays on clay courts also and ASO getting dirty.  11/28: Patient returns today stating she has continued to have intermittent pain in medial right ankle. Overall she improved and has still been able to play tennis. Wearing ASO with tennis. Very active. Pain is worse at night and when she is not on her ankle. Completed her PT and a home exercise program. Prior radiographs x 2 as well as ultrasound did not show a fracture at medial malleolus. Feels like it is more prominent on right medial malleolus compared to left. Very active in playing sports. She has been postponing returning for a follow-up visit.  History reviewed. No pertinent past medical history.  Current Outpatient Prescriptions on File Prior to Visit  Medication Sig Dispense Refill  . clonazePAM (KLONOPIN) 0.5 MG tablet       . sertraline (ZOLOFT) 100 MG tablet         History reviewed. No pertinent past surgical history.  No Known Allergies  History   Social History  . Marital Status: Married    Spouse Name: N/A    Number of Children: N/A  . Years of  Education: N/A   Occupational History  . Not on file.   Social History Main Topics  . Smoking status: Never Smoker   . Smokeless tobacco: Not on file  . Alcohol Use: No  . Drug Use: Not on file  . Sexually Active: Not on file   Other Topics Concern  . Not on file   Social History Narrative  . No narrative on file    Family History  Problem Relation Age of Onset  . Hypertension Mother   . Heart attack Neg Hx   . Diabetes Neg Hx     BP 98/67  Pulse 54  Temp(Src) 97.8 F (36.6 C) (Oral)  Ht 5\' 8"  (1.727 m)  Wt 195  lb (88.451 kg)  BMI 29.65 kg/m2  Review of Systems  See HPI above.    Objective:   Physical Exam  Gen: NAD R ankle: Normal contour of medial malleolus without palpable callus or abnormality - slightly more prominent malleolus than left ankle.  No bruising.  No redness, warmth.  No swelling about ankle elsewhere. Mild TTP focally at medial malleolus but no callus, crepitation at this area.  No tenderness at ATFL.  No TTP lat malleolus, fibular head, base 5th, navicular, or elsewhere about foot. FROM all planes. Strength 5/5 with all motions ankle and no pain with these. Negative ant drawer and talar tilt. Negative thompsons, achilles palpated and intact. NVI distally with 2+ dp pulse  MSK u/s R ankle - medial malleolus has a focal area of increased neovascularity and double line that is not visible on left ankle - she is tender at this location.  Reviewed prior ultrasounds - did have mild increased neovascularity on right compared to left but bony cortices appeared similar.     Assessment & Plan:  1. Right ankle pain - Patient's presentation is unusual.  She had an acute injury back in April and at that time did have tenderness at medial malleolus but normal radiographs.  Persistent pain here so repeated radiographs and had performed 2 ultrasounds since then that were essentially normal outside of mild increased neovascularity medial malleolus.  I would expect even with a hairline fracture she would have had apparent findings on imaging studies to confirm this and she wouldn't have improved as she did leading up to last visit.  Further, would expect a hairline fracture in this area to have healed by this point.  A stress fracture superimposed on her prior ankle injury is also a possibility - ultrasound today shows the bony cortex on right medial malleolus appears more irregular and with a bony double line, focal area of intense neovascularity as I would expect with a medial malleolar stress  fracture that is healing.  I recommended patient cease cardiovascular activity for the time being - will move forward with MRI to further evaluate this area and assess whether this is a true fracture or a nutrient vessel (most likely the former).  If present, would initiate our tibial stress fracture protocol with long aircast.    Addendum 12/3: Called patient with her MRI results - no evidence of bony abnormalities medial malleolus where she is tender - as such, I do believe this is a nutrient foramen with vessel that I was seeing on ultrasound.  She does have tendinopathy of posterior tibialis tendon, ? Partial tearing but she has no pain on resisted motions and has full strength.  Will try 1/4 0.2 nitro patch and change daily.  No restrictions on activity.  Continue with home exercises.  See back in office in 6 weeks for a recheck - discussed headaches, skin irritation - if develops these and they are severe, advised to stop and give me a call.

## 2011-01-15 NOTE — Assessment & Plan Note (Signed)
Patient's presentation is unusual.  She had an acute injury back in April and at that time did have tenderness at medial malleolus but normal radiographs.  Persistent pain here so repeated radiographs and had performed 2 ultrasounds since then that were essentially normal outside of mild increased neovascularity medial malleolus.  I would expect even with a hairline fracture she would have had apparent findings on imaging studies to confirm this and she wouldn't have improved as she did leading up to last visit.  Further, would expect a hairline fracture in this area to have healed by this point.  A stress fracture superimposed on her prior ankle injury is also a possibility - ultrasound today shows the bony cortex on right medial malleolus appears more irregular and with a bony double line, focal area of intense neovascularity as I would expect with a medial malleolar stress fracture that is healing.  I recommended patient cease cardiovascular activity for the time being - will move forward with MRI to further evaluate this area and assess whether this is a true fracture or a nutrient vessel (most likely the former).  If present, would initiate our tibial stress fracture protocol with long aircast.

## 2011-01-15 NOTE — Patient Instructions (Signed)
Despite prior x-rays and ultrasounds not showing anything (or showing symmetric findings on both ankles), your persistent pain along with today's ultrasound are concerning for a medial malleolar stress fracture. We will move forward with an MRI to further assess why this isn't healing (you may need a longer period of rest) and to ensure nothing else is going on with your ankle. Continue icing and taking aleve as needed. I would recommend not doing any weight bearing exercises until we get the results - ok to weight lift (open chain only on lower extremities) though.

## 2011-01-18 ENCOUNTER — Ambulatory Visit (HOSPITAL_BASED_OUTPATIENT_CLINIC_OR_DEPARTMENT_OTHER)
Admission: RE | Admit: 2011-01-18 | Discharge: 2011-01-18 | Disposition: A | Discharge status: Home / Self Care | Payer: 59 | Source: Ambulatory Visit | Attending: Family Medicine | Admitting: Family Medicine

## 2011-01-18 DIAGNOSIS — M25579 Pain in unspecified ankle and joints of unspecified foot: Secondary | ICD-10-CM

## 2011-01-18 DIAGNOSIS — M65979 Unspecified synovitis and tenosynovitis, unspecified ankle and foot: Secondary | ICD-10-CM | POA: Insufficient documentation

## 2011-01-18 DIAGNOSIS — M659 Synovitis and tenosynovitis, unspecified: Secondary | ICD-10-CM

## 2011-01-18 DIAGNOSIS — M25571 Pain in right ankle and joints of right foot: Secondary | ICD-10-CM

## 2011-01-20 MED ORDER — NITROGLYCERIN 0.2 MG/HR TD PT24: MEDICATED_PATCH | TRANSDERMAL | Status: DC

## 2011-01-20 NOTE — Progress Notes (Signed)
Addended by: Norton Blizzard R on: 01/20/2011 05:00 PM   Modules accepted: Orders

## 2011-10-13 ENCOUNTER — Other Ambulatory Visit: Payer: Self-pay | Admitting: Obstetrics & Gynecology

## 2011-10-13 DIAGNOSIS — R928 Other abnormal and inconclusive findings on diagnostic imaging of breast: Secondary | ICD-10-CM

## 2011-10-15 ENCOUNTER — Ambulatory Visit
Admission: RE | Admit: 2011-10-15 | Discharge: 2011-10-15 | Disposition: A | Discharge status: Home / Self Care | Payer: 59 | Source: Ambulatory Visit | Attending: Obstetrics & Gynecology | Admitting: Obstetrics & Gynecology

## 2011-10-15 DIAGNOSIS — R928 Other abnormal and inconclusive findings on diagnostic imaging of breast: Secondary | ICD-10-CM

## 2012-12-22 ENCOUNTER — Other Ambulatory Visit: Payer: Self-pay

## 2012-12-22 DIAGNOSIS — Z1231 Encounter for screening mammogram for malignant neoplasm of breast: Secondary | ICD-10-CM

## 2013-01-25 ENCOUNTER — Ambulatory Visit: Payer: 59

## 2013-03-29 ENCOUNTER — Ambulatory Visit
Admission: RE | Admit: 2013-03-29 | Discharge: 2013-03-29 | Disposition: A | Discharge status: Home / Self Care | Payer: 59 | Source: Ambulatory Visit

## 2013-03-29 DIAGNOSIS — Z1231 Encounter for screening mammogram for malignant neoplasm of breast: Secondary | ICD-10-CM

## 2014-02-14 ENCOUNTER — Other Ambulatory Visit: Payer: Self-pay | Admitting: Obstetrics & Gynecology

## 2014-02-15 LAB — CYTOLOGY - PAP

## 2014-03-21 ENCOUNTER — Ambulatory Visit (INDEPENDENT_AMBULATORY_CARE_PROVIDER_SITE_OTHER): Payer: 59 | Admitting: Neurology

## 2014-03-21 ENCOUNTER — Encounter: Payer: Self-pay | Admitting: Neurology

## 2014-03-21 VITALS — BP 100/61 | HR 55 | Ht 68.0 in | Wt 176.2 lb

## 2014-03-21 DIAGNOSIS — G250 Essential tremor: Secondary | ICD-10-CM

## 2014-03-21 DIAGNOSIS — G2581 Restless legs syndrome: Secondary | ICD-10-CM

## 2014-03-21 DIAGNOSIS — R42 Dizziness and giddiness: Secondary | ICD-10-CM

## 2014-03-21 DIAGNOSIS — G43709 Chronic migraine without aura, not intractable, without status migrainosus: Secondary | ICD-10-CM

## 2014-03-21 DIAGNOSIS — G43909 Migraine, unspecified, not intractable, without status migrainosus: Secondary | ICD-10-CM

## 2014-03-21 HISTORY — DX: Migraine, unspecified, not intractable, without status migrainosus: G43.909

## 2014-03-21 HISTORY — DX: Essential tremor: G25.0

## 2014-03-21 HISTORY — DX: Restless legs syndrome: G25.81

## 2014-03-21 MED ORDER — TOPIRAMATE 25 MG PO TABS: ORAL_TABLET | ORAL | Status: DC

## 2014-03-21 NOTE — Progress Notes (Signed)
Reason for visit: Tremor  Pamela Little is a 44 y.o. female  History of present illness:  Pamela Little is a 44 year old right-handed white female with a history of a tremor that has been present for about 20 years or more. The patient has a slight tremor that affects both arms, and is noticeable with activity involving the arms such as doing her nails, and bringing a drink to her mouth. She denies that the tremor is affecting her handwriting much. The patient has a sister with a similar tremor, but the sister also has a tremor affecting the head and neck. The patient does not recall if any of her parents have a tremor. The patient also reports some problems with restless leg syndrome that began about 18 months ago. The patient has a sensation that she needs to stretch her arms and legs, she will have jerking and twitching of the extremities at nighttime. The problem comes on after prolonged inactivity, or at night, and she is trying to rest. She is on Requip good benefit. The patient is on 1 mg Requip, and she may go up to a 2 mg dose in the near future. She has had a transferrin level that was unremarkable. She has Ativan that she takes, 0.5 mg in the morning and 1 mg at night. She has also had issues with episodic vertigo, she has had 2 bouts of vertigo in the last year. She indicates that she has a mild daily headache, and the headaches seemed to worsen around the time of the vertigo. The headaches are in the frontal regions. The vertigo seems to worsen when she lies down flat. She has a sensation of fullness in the ears, and she has allergy symptoms. She has been seen by ENT, Dr. Lovey Newcomer previously.  Past Medical History  Diagnosis Date  . Panic attacks   . Vertigo   . 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 Restless legs syndrome 03/21/2014  . Migraine 03/21/2014    Past Surgical History  Procedure Laterality Date  . Cesarean section with bilateral tubal ligation    . Ablation on endometriosis    .  Tonsillectomy    . Myringotomy with tube placement      Family History  Problem Relation Age of Onset  . Hypertension Mother   . Heart attack Neg Hx   . Diabetes Neg Hx   . Hypertension Father   . Tremor Sister     Social history:  reports that she has never smoked. She has never used smokeless tobacco. She reports that she does not drink alcohol. Her drug history is not on file.  Medications:  Current Outpatient Prescriptions on File Prior to Visit  Medication Sig Dispense Refill  . sertraline (ZOLOFT) 100 MG tablet 2 (two) times daily.      No current facility-administered medications on file prior to visit.     No Known Allergies  ROS:  Out of a complete 14 system review of symptoms, the patient complains only of the following symptoms, and all other reviewed systems are negative.  Palpitations of the heart Vertigo Blurred vision Anxiety, panic disorder Snoring, restless legs  Blood pressure 100/61, pulse 55, height  (1.727 m), weight 176 lb 3.2 oz (79.924 kg).  Physical Exam  General: The patient is alert and cooperative at the time of the examination.  Eyes: Pupils are equal, round, and reactive to light. Discs are flat bilaterally.  Neck: The neck is supple, no carotid bruits are  noted.  Respiratory: The respiratory examination is clear.  Cardiovascular: The cardiovascular examination reveals a regular rate and rhythm, no obvious murmurs or rubs are noted.  Skin: Extremities are without significant edema.  Neurologic Exam  Mental status: The patient is alert and oriented x 3 at the time of the examination. The patient has apparent normal recent and remote memory, with an apparently normal attention span and concentration ability.  Cranial nerves: Facial symmetry is present. There is good sensation of the face to pinprick and soft touch bilaterally. The strength of the facial muscles and the muscles to head turning and shoulder shrug are normal  bilaterally. Speech is well enunciated, no aphasia or dysarthria is noted. Extraocular movements are full. Visual fields are full. The tongue is midline, and the patient has symmetric elevation of the soft palate. No obvious hearing deficits are noted.  Motor: The motor testing reveals 5 over 5 strength of all 4 extremities. Good symmetric motor tone is noted throughout.  Sensory: Sensory testing is intact to pinprick, soft touch, vibration sensation, and position sense on all 4 extremities. No evidence of extinction is noted.  Coordination: Cerebellar testing reveals good finger-nose-finger and heel-to-shin bilaterally. A significant intention tremor was not seen.  Gait and station: Gait is normal. Tandem gait is normal. Romberg is negative. No drift is seen.  Reflexes: Deep tendon reflexes are symmetric and normal bilaterally. Toes are downgoing bilaterally.   Assessment/Plan:  1. Restless leg syndrome  2. Headache  3. Vertigo  4. 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 The patient does not have significant tremor that would warrant medical therapy at this point. She does have episodes of vertigo, headache, and she may have migraine. She will be sent for MRI of the brain, and she will be started on Topamax therapy. She is being managed for her restless leg syndrome through her psychiatrist. The patient will undergo some blood work today looking at the thyroid study. She will follow-up in about 4 months.  Marlan Palau. Keith Telesha Deguzman MD 03/21/2014 7:02 PM  [facility] [address] [place], [place] [id number]  Phone [phone] Fax [phone]

## 2014-03-21 NOTE — Patient Instructions (Addendum)
  Topamax (topiramate) is a seizure medication that has an FDA approval for seizures and for migraine headache. Potential side effects of this medication include weight loss, cognitive slowing, tingling in the fingers and toes, and carbonated drinks will taste bad. If any significant side effects are noted on this drug, please contact our office.   Tremor Tremor is a rhythmic, involuntary muscular contraction characterized by oscillations (to-and-fro movements) of a part of the body. The most common of all involuntary movements, tremor can affect various body parts such as the hands, head, facial structures, vocal cords, trunk, and legs; most tremors, however, occur in the hands. Tremor often accompanies neurological disorders associated with aging. Although the disorder is not life-threatening, it can be responsible for functional disability and social embarrassment. TREATMENT  There are many types of tremor and several ways in which tremor is classified. The most common classification is by behavioral context or position. There are five categories of tremor within this classification: resting, postural, kinetic, task-specific, and psychogenic. Resting or static tremor occurs when the muscle is at rest, for example when the hands are lying on the lap. This type of tremor is often seen in patients with Parkinson's disease. Postural tremor occurs when a patient attempts to maintain posture, such as holding the hands outstretched. Postural tremors include physiological tremor, essential tremor, tremor with basal ganglia disease (also seen in patients with Parkinson's disease), cerebellar postural tremor, tremor with peripheral neuropathy, post-traumatic tremor, and alcoholic tremor. Kinetic or intention (action) tremor occurs during purposeful movement, for example during finger-to-nose testing. Task-specific tremor appears when performing goal-oriented tasks such as handwriting, speaking, or standing. This  group consists of primary writing tremor, vocal tremor, and orthostatic tremor. Psychogenic tremor occurs in both older and younger patients. The key feature of this tremor is that it dramatically lessens or disappears when the patient is distracted. PROGNOSIS There are some treatment options available for tremor; the appropriate treatment depends on accurate diagnosis of the cause. Some tremors respond to treatment of the underlying condition, for example in some cases of psychogenic tremor treating the patient's underlying mental problem may cause the tremor to disappear. Also, patients with tremor due to Parkinson's disease may be treated with Levodopa drug therapy. Symptomatic drug therapy is available for several other tremors as well. For those cases of tremor in which there is no effective drug treatment, physical measures such as teaching the patient to brace the affected limb during the tremor are sometimes useful. Surgical intervention such as thalamotomy or deep brain stimulation may be useful in certain cases. Document Released: 01/24/2002 Document Revised: 04/28/2011 Document Reviewed: 02/03/2005 Exit[employer] Patient Information 2015 [employer]. This information is not intended to replace advice given to you by your health care provider. Make sure you discuss any questions you have with your health care provider.  

## 2014-03-22 LAB — TSH: TSH: 2.93 u[IU]/mL (ref 0.450–4.500)

## 2014-03-22 LAB — FERRITIN: Ferritin: 117 ng/mL (ref 15–150)

## 2014-04-13 ENCOUNTER — Ambulatory Visit (INDEPENDENT_AMBULATORY_CARE_PROVIDER_SITE_OTHER): Payer: 59

## 2014-04-13 DIAGNOSIS — G43709 Chronic migraine without aura, not intractable, without status migrainosus: Secondary | ICD-10-CM | POA: Diagnosis not present

## 2014-04-13 DIAGNOSIS — G2581 Restless legs syndrome: Secondary | ICD-10-CM

## 2014-04-13 DIAGNOSIS — G250 Essential tremor: Secondary | ICD-10-CM

## 2014-04-13 DIAGNOSIS — R42 Dizziness and giddiness: Secondary | ICD-10-CM

## 2014-04-17 ENCOUNTER — Telehealth: Payer: Self-pay | Admitting: Neurology

## 2014-04-17 MED ORDER — GADOPENTETATE DIMEGLUMINE 469.01 MG/ML IV SOLN: 18.0000 mL | Freq: Once | INTRAVENOUS | Status: AC | PRN

## 2014-04-17 NOTE — Telephone Encounter (Signed)
  I called the patient. MRI the brain shows a single 2 mm left frontal white matter lesion. The patient does have a history of migraine, this is essentially a normal study. I discussed this with the patient.   MRI brain 04/14/14:  IMPRESSION: Slightly abnormal MRI scan of the brain showing solitary tiny nonspecific left frontal subcortical white matter hyperintensity with the differential discussed above. Incidental low-lying cerebellar tonsils are noted.

## 2014-08-23 ENCOUNTER — Other Ambulatory Visit: Payer: Self-pay

## 2014-08-23 DIAGNOSIS — Z1231 Encounter for screening mammogram for malignant neoplasm of breast: Secondary | ICD-10-CM

## 2014-08-29 ENCOUNTER — Other Ambulatory Visit: Payer: Self-pay | Admitting: Obstetrics & Gynecology

## 2014-08-30 ENCOUNTER — Ambulatory Visit
Admission: RE | Admit: 2014-08-30 | Discharge: 2014-08-30 | Disposition: A | Discharge status: Home / Self Care | Payer: Commercial Managed Care - HMO | Source: Ambulatory Visit

## 2014-08-30 DIAGNOSIS — Z1231 Encounter for screening mammogram for malignant neoplasm of breast: Secondary | ICD-10-CM

## 2014-09-05 LAB — CYTOLOGY - PAP

## 2014-12-06 ENCOUNTER — Other Ambulatory Visit: Payer: Self-pay | Admitting: Neurology

## 2014-12-21 ENCOUNTER — Other Ambulatory Visit: Payer: Self-pay | Admitting: Neurology

## 2015-06-12 ENCOUNTER — Other Ambulatory Visit: Payer: Self-pay

## 2015-06-12 DIAGNOSIS — Z1231 Encounter for screening mammogram for malignant neoplasm of breast: Secondary | ICD-10-CM

## 2015-08-31 ENCOUNTER — Ambulatory Visit: Payer: Commercial Managed Care - HMO

## 2016-01-31 ENCOUNTER — Ambulatory Visit
Admission: RE | Admit: 2016-01-31 | Discharge: 2016-01-31 | Disposition: A | Discharge status: Home / Self Care | Payer: 59 | Source: Ambulatory Visit

## 2016-01-31 DIAGNOSIS — Z1231 Encounter for screening mammogram for malignant neoplasm of breast: Secondary | ICD-10-CM

## 2016-06-02 ENCOUNTER — Ambulatory Visit (INDEPENDENT_AMBULATORY_CARE_PROVIDER_SITE_OTHER): Payer: 59 | Admitting: Family Medicine

## 2016-06-02 ENCOUNTER — Encounter: Payer: Self-pay | Admitting: Family Medicine

## 2016-06-02 ENCOUNTER — Encounter (INDEPENDENT_AMBULATORY_CARE_PROVIDER_SITE_OTHER): Payer: Self-pay

## 2016-06-02 DIAGNOSIS — M7711 Lateral epicondylitis, right elbow: Secondary | ICD-10-CM

## 2016-06-02 NOTE — Patient Instructions (Signed)
You have lateral epicondylitis Try to avoid painful activities as much as possible. Ice the area 3-4 times a day for 15 minutes at a time. Aleve 2 tabs twice a day with food for pain and inflammation. Consider topical capsaicin, salon pas, biofreeze up to 4 times a day also. Counterforce brace as directed can help unload area - wear this regularly if it provides you with relief. Hammer rotation exercise, wrist extension exercise with 1 pound weight - 3 sets of 10 once a day.   Stretching - hold for 20-30 seconds and repeat 3 times. Consider physical therapy, nitro patches, injection if not improving. Follow up in 6 weeks.

## 2016-06-04 DIAGNOSIS — M7711 Lateral epicondylitis, right elbow: Secondary | ICD-10-CM | POA: Insufficient documentation

## 2016-06-04 NOTE — Progress Notes (Signed)
PCP: No PCP Per Patient  Subjective:   HPI: Patient is a 46 y.o. female here for right elbow pain.  Patient reports she's had about 2 weeks of right lateral elbow. Pain level currently 1/10, a soreness. Came on after playing tennis, [place] suitcase through the airport. Bothers when sleeping on this side also. Taking aleve. Worse after playing tennis. Has been rubbing elbow. Right handed. Squeezing something, clicking mouse bother her. No skin changes, numbness.  Past Medical History:  Diagnosis Date  . Migraine 03/21/2014  . Panic attacks   . Restless legs syndrome 03/21/2014  . 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 Vertigo     Current Outpatient Prescriptions on File Prior to Visit  Medication Sig Dispense Refill  . buPROPion (WELLBUTRIN XL) 300 MG 24 hr tablet 300 mg daily.    Marland Kitchen rOPINIRole (REQUIP) 1 MG tablet 1 mg daily.    . sertraline (ZOLOFT) 100 MG tablet 2 (two) times daily.     Marland Kitchen topiramate (TOPAMAX) 25 MG tablet TAKE 1 TABLET BY MOUTH AT NIGHT FOR 1 WEEK, THEN TAKE 2 TABLETS AT NIGHT 30 tablet 0   No current facility-administered medications on file prior to visit.     Past Surgical History:  Procedure Laterality Date  . ABLATION ON ENDOMETRIOSIS    . CESAREAN SECTION WITH BILATERAL TUBAL LIGATION    . MYRINGOTOMY WITH TUBE PLACEMENT    . TONSILLECTOMY      No Known Allergies  Social History   Social History  . Marital status: Married    Spouse name: N/A  . Number of children: 3  . Years of education: college   Occupational History  . Not on file.   Social History Main Topics  . Smoking status: Never Smoker  . Smokeless tobacco: Never Used  . Alcohol use No  . Drug use: Unknown  . Sexual activity: Not on file   Other Topics Concern  . Not on file   Social History Narrative   Patient is right handed.   Patient drinks 1 cup caffeine daily.    Family History  Problem Relation Age of Onset  . Hypertension Mother   . Heart attack Neg Hx   .  Diabetes Neg Hx   . Hypertension Father   . Tremor Sister     BP 113/74   Pulse 76   Ht  (1.727 m)   Wt 200 lb (90.7 kg)   BMI 30.41 kg/m   Review of Systems: See HPI above.     Objective:  Physical Exam:  Gen: NAD, comfortable in exam room  Right elbow: No gross deformity, swelling, bruising. TTP lateral epicondyle.  No other tenderness. FROM elbow and wrist.  Pain on resisted 3rd digit extension, some on supination.  Strength 5/5. Collateral ligaments intact. NVI distally. Negative tinels at cubital tunnel.  Left elbow: FROM without pain.   Assessment & Plan:  1. Right lateral epicondylitis - shown home exercises to do daily.  Icing, aleve.  Counterforce brace.  Consider topical medications, physical therapy, nitro, injection if not improving.  F/u in 6 weeks.

## 2016-06-04 NOTE — Assessment & Plan Note (Signed)
shown home exercises to do daily.  Icing, aleve.  Counterforce brace.  Consider topical medications, physical therapy, nitro, injection if not improving.  F/u in 6 weeks.

## 2016-06-30 ENCOUNTER — Encounter: Payer: Self-pay | Admitting: Family Medicine

## 2016-06-30 ENCOUNTER — Ambulatory Visit (INDEPENDENT_AMBULATORY_CARE_PROVIDER_SITE_OTHER): Payer: 59 | Admitting: Family Medicine

## 2016-06-30 DIAGNOSIS — M7711 Lateral epicondylitis, right elbow: Secondary | ICD-10-CM | POA: Diagnosis not present

## 2016-06-30 NOTE — Patient Instructions (Signed)
You have a partial tear of the common extensor tendon of your elbow. Use wrist brace and/or sling to rest this. No tennis for next 2 weeks at least (likely will need 4 weeks of rehab after this before returning to tennis). Try to avoid painful activities as much as possible. Ice the area 3-4 times a day for 15 minutes at a time. Aleve 2 tabs twice a day with food for pain and inflammation. Consider topical capsaicin, salon pas, biofreeze up to 4 times a day also. Come out of sling/brace twice a day to do easy motion exercises of the elbow. Follow up in 2 weeks.

## 2016-07-02 NOTE — Progress Notes (Signed)
PCP: Patient, No Pcp Per  Subjective:   HPI: Patient is a 46 y.o. female here for right elbow pain.  4/16: Patient reports she's had about 2 weeks of right lateral elbow. Pain level currently 1/10, a soreness. Came on after playing tennis, [place] the airport. Bothers when sleeping on this side also. Taking aleve. Worse after playing tennis. Has been rubbing elbow. Right handed. Squeezing something, clicking mouse bother her. No skin changes, numbness.  5/14: Patient reports she was feeling better doing home exercises, resting, hadn't played tennis in over a week. Then was playing tennis on 5/10 with her tennis elbow brace on and hit a volley - felt tight feeling lateral right elbow. + swelling. No bruising. Not taking anything for this currently. Worse with motions of wrist. Pain is 3/10 currently, sharp with movement. No skin changes, numbness.  Past Medical History:  Diagnosis Date  . Migraine 03/21/2014  . Panic attacks   . Restless legs syndrome 03/21/2014  . 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 Vertigo     Current Outpatient Prescriptions on File Prior to Visit  Medication Sig Dispense Refill  . buPROPion (WELLBUTRIN XL) 300 MG 24 hr tablet 300 mg daily.    Marland Kitchen LORazepam (ATIVAN) 1 MG tablet     . Pramipexole Dihydrochloride 3 MG TB24     . rOPINIRole (REQUIP) 1 MG tablet 1 mg daily.    . sertraline (ZOLOFT) 100 MG tablet 2 (two) times daily.     Marland Kitchen topiramate (TOPAMAX) 25 MG tablet TAKE 1 TABLET BY MOUTH AT NIGHT FOR 1 WEEK, THEN TAKE 2 TABLETS AT NIGHT 30 tablet 0   No current facility-administered medications on file prior to visit.     Past Surgical History:  Procedure Laterality Date  . ABLATION ON ENDOMETRIOSIS    . CESAREAN SECTION WITH BILATERAL TUBAL LIGATION    . MYRINGOTOMY WITH TUBE PLACEMENT    . TONSILLECTOMY      No Known Allergies  Social History   Social History  . Marital status: Married    Spouse name: N/A  . Number of  children: 3  . Years of education: college   Occupational History  . Not on file.   Social History Main Topics  . Smoking status: Never Smoker  . Smokeless tobacco: Never Used  . Alcohol use No  . Drug use: Unknown  . Sexual activity: Not on file   Other Topics Concern  . Not on file   Social History Narrative   Patient is right handed.   Patient drinks 1 cup caffeine daily.    Family History  Problem Relation Age of Onset  . Hypertension Mother   . Heart attack Neg Hx   . Diabetes Neg Hx   . Hypertension Father   . Tremor Sister     BP 117/70   Pulse 66   Ht 5\' 8"  (1.727 m)   Wt 200 lb (90.7 kg)   BMI 30.41 kg/m   Review of Systems: See HPI above.     Objective:  Physical Exam:  Gen: NAD, comfortable in exam room  Right elbow: No gross deformity, swelling, bruising. TTP lateral epicondyle and just distal to this into forearm extensors.  No other tenderness. FROM elbow and wrist.  Pain on resisted 3rd digit extension, supination, wrist extension. Collateral ligaments intact. NVI distally.  Left elbow: FROM without pain.   Assessment & Plan:  1. Extensor strain on top of right lateral epicondylitis - wrist brace and/or  sling to rest.  Icing.  Aleve twice a day with food.  Motion exercises of elbow and wrist.  F/u in 2 weeks.  Discussed may need to rest and rehab for total of 6 weeks - will take more conservative approach with acute exacerbation.

## 2016-07-02 NOTE — Assessment & Plan Note (Signed)
wrist brace and/or sling to rest.  Icing.  Aleve twice a day with food.  Motion exercises of elbow and wrist.  F/u in 2 weeks.  Discussed may need to rest and rehab for total of 6 weeks - will take more conservative approach with acute exacerbation.

## 2016-07-15 ENCOUNTER — Ambulatory Visit (INDEPENDENT_AMBULATORY_CARE_PROVIDER_SITE_OTHER): Payer: 59 | Admitting: Family Medicine

## 2016-07-15 ENCOUNTER — Encounter: Payer: Self-pay | Admitting: Family Medicine

## 2016-07-15 DIAGNOSIS — M7711 Lateral epicondylitis, right elbow: Secondary | ICD-10-CM | POA: Diagnosis not present

## 2016-07-15 NOTE — Patient Instructions (Signed)
You have a partial tear of the common extensor tendon of your elbow. Start physical therapy and do home exercises on days you don't go to therapy. I wouldn't try to hit tennis balls until a week before your matches. When you do, start with 15 minutes of hitting - increase every other day by about 15 minutes to ensure pain does not worsen (very mild soreness is ok). Ice the area 3-4 times a day for 15 minutes at a time. Aleve 2 tabs twice a day with food for pain and inflammation as needed. Consider topical capsaicin, salon pas, biofreeze up to 4 times a day also. Follow up with me in 4 weeks for reevaluation.

## 2016-07-16 NOTE — Assessment & Plan Note (Signed)
Extensor strain on top of right lateral epicondylitis - clinically improving over past 2 weeks.  Will start physical therapy and home exercises.  Icing, aleve, reviewed topical medications.  Also reviewed a return to play program.  F/u in 4 weeks for reevaluation.

## 2016-07-16 NOTE — Progress Notes (Signed)
PCP: Patient, No Pcp Per  Subjective:   HPI: Patient is a 46 y.o. female here for right elbow pain.  4/16: Patient reports she's had about 2 weeks of right lateral elbow. Pain level currently 1/10, a soreness. Came on after playing tennis, [place] suitcase [place] the airport. Bothers when sleeping on this side also. Taking aleve. Worse after playing tennis. Has been rubbing elbow. Right handed. Squeezing something, clicking mouse bother her. No skin changes, numbness.  5/14: Patient reports she was feeling better doing home exercises, resting, hadn't played tennis in over a week. Then was playing tennis on 5/10 with her tennis elbow brace on and hit a volley - felt tight feeling lateral right elbow. + swelling. No bruising. Not taking anything for this currently. Worse with motions of wrist. Pain is 3/10 currently, sharp with movement. No skin changes, numbness.  5/29: Patient reports she feels better. Pain down to 0/10. She is using a sideways mouse. Tried wrist brace initially but seemed to bother her more. Pain is a stiffness, soreness mainly in the morning lateral elbow. Not taking anything currently for this. No skin changes, numbness.  Past Medical History:  Diagnosis Date  . Migraine 03/21/2014  . Panic attacks   . Restless legs syndrome 03/21/2014  . 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 Vertigo     Current Outpatient Prescriptions on File Prior to Visit  Medication Sig Dispense Refill  . buPROPion (WELLBUTRIN XL) 300 MG 24 hr tablet 300 mg daily.    Marland Kitchen. LORazepam (ATIVAN) 1 MG tablet     . Pramipexole Dihydrochloride 3 MG TB24     . rOPINIRole (REQUIP) 1 MG tablet 1 mg daily.    . sertraline (ZOLOFT) 100 MG tablet 2 (two) times daily.     Marland Kitchen. topiramate (TOPAMAX) 25 MG tablet TAKE 1 TABLET BY MOUTH AT NIGHT FOR 1 WEEK, THEN TAKE 2 TABLETS AT NIGHT 30 tablet 0   No current facility-administered medications on file prior to visit.     Past Surgical History:   Procedure Laterality Date  . ABLATION ON ENDOMETRIOSIS    . CESAREAN SECTION WITH BILATERAL TUBAL LIGATION    . MYRINGOTOMY WITH TUBE PLACEMENT    . TONSILLECTOMY      No Known Allergies  Social History   Social History  . Marital status: Married    Spouse name: N/A  . Number of children: 3  . Years of education: college   Occupational History  . Not on file.   Social History Main Topics  . Smoking status: Never Smoker  . Smokeless tobacco: Never Used  . Alcohol use No  . Drug use: Unknown  . Sexual activity: Not on file   Other Topics Concern  . Not on file   Social History Narrative   Patient is right handed.   Patient drinks 1 cup caffeine daily.    Family History  Problem Relation Age of Onset  . Hypertension Mother   . Heart attack Neg Hx   . Diabetes Neg Hx   . Hypertension Father   . Tremor Sister     BP 101/73   Pulse 70   Ht 5\' 8"  (1.727 m)   Review of Systems: See HPI above.     Objective:  Physical Exam:  Gen: NAD, comfortable in exam room  Right elbow: No gross deformity, swelling, bruising. Mild TTP lateral epicondyle.  No other tenderness. FROM elbow and wrist.  Pain on resisted 3rd digit extension, supination. Collateral ligaments intact.  NVI distally.  Left elbow: FROM without pain.   Assessment & Plan:  1. Extensor strain on top of right lateral epicondylitis - clinically improving over past 2 weeks.  Will start physical therapy and home exercises.  Icing, aleve, reviewed topical medications.  Also reviewed a return to play program.  F/u in 4 weeks for reevaluation.

## 2016-07-17 NOTE — Addendum Note (Signed)
Addended by: Kathi SimpersWISE, Oda Lansdowne F on: 07/17/2016 08:33 AM   Modules accepted: Orders

## 2016-07-22 ENCOUNTER — Telehealth: Payer: Self-pay | Admitting: Family Medicine

## 2016-07-22 NOTE — Telephone Encounter (Signed)
Records state they called and left her a message around 11am on 5/31 - may want to make sure they have the correct phone number if she didn't get a message.  Thanks!

## 2016-07-22 NOTE — Telephone Encounter (Signed)
Patient states she has not heard anything from physical therapy to set up an appointment

## 2016-07-22 NOTE — Telephone Encounter (Signed)
Spoke to patient and gave her number to contact PT.

## 2016-07-28 ENCOUNTER — Ambulatory Visit: Payer: 59 | Attending: Family Medicine | Admitting: Physical Therapy

## 2016-07-28 DIAGNOSIS — M25521 Pain in right elbow: Secondary | ICD-10-CM | POA: Insufficient documentation

## 2016-07-28 DIAGNOSIS — M6281 Muscle weakness (generalized): Secondary | ICD-10-CM | POA: Insufficient documentation

## 2016-07-31 ENCOUNTER — Encounter: Payer: Self-pay | Admitting: Rehabilitative and Restorative Service Providers"

## 2016-07-31 ENCOUNTER — Ambulatory Visit: Payer: 59 | Admitting: Rehabilitative and Restorative Service Providers"

## 2016-07-31 DIAGNOSIS — M25521 Pain in right elbow: Secondary | ICD-10-CM

## 2016-07-31 DIAGNOSIS — M6281 Muscle weakness (generalized): Secondary | ICD-10-CM | POA: Diagnosis present

## 2016-07-31 NOTE — Therapy (Signed)
[facility] [address], [address] [place], [place], [address] Phone: [phone]   Fax:  [phone]  Physical Therapy Evaluation  Patient Details  Name: Pamela Little MRN: [id number] Date of Birth: Aug 06, 1970 Referring Provider: Pearletha Forge, MD  Encounter Date: 07/31/2016      PT End of Session - 07/31/16 1318    Visit Number 1   Number of Visits 12   Date for PT Re-Evaluation 09/11/16   PT Start Time 0848   PT Stop Time 0932   PT Time Calculation (min) 44 min   Activity Tolerance Patient tolerated treatment well;No increased pain   Behavior During Therapy WFL for tasks assessed/performed      Past Medical History:  Diagnosis Date  . Migraine 03/21/2014  . Panic attacks   . Restless legs syndrome 03/21/2014  . 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 Vertigo     Past Surgical History:  Procedure Laterality Date  . ABLATION ON ENDOMETRIOSIS    . CESAREAN SECTION WITH BILATERAL TUBAL LIGATION    . MYRINGOTOMY WITH TUBE PLACEMENT    . TONSILLECTOMY      There were no vitals filed for this visit.       Subjective Assessment - 07/31/16 0848    Subjective I need to be able to play tennis without pain 3 or 4x/week. I work but have a new mouse so my arm doesn't bother me.    Pertinent History Pt was playing tennis and tore brachioradialis 25% early May ; she stopped playing tennis x 1 month and recently began again due to going to tennis champsionship. Tennis elbow strap was worn and is still being worn to address pain when playing   Limitations --  playing tennis, sleeping, carrying groceries   How long can you sit comfortably? > 1 hour   How long can you stand comfortably? > 1 hour   How long can you walk comfortably? > 1 hour; no limitation   Patient Stated Goals to be able to play tennis and use my arm as normal without compensation   Currently in Pain? Yes   Pain Score 2    Pain Location Elbow   Pain Orientation Right    Pain Descriptors / Indicators Nagging   Pain Type Acute pain   Pain Radiating Towards stays centralized at elbow   Pain Onset 1 to 4 weeks ago   Pain Frequency Intermittent   Aggravating Factors  playing tennis, carrying grocery bags   Pain Relieving Factors icing    Effect of Pain on Daily Activities pt is limited in activities due to pain   Multiple Pain Sites No            [facility] PT Assessment - 07/31/16 0001      Assessment   Medical Diagnosis R lateral epicondlitis   Referring Provider Pearletha Forge, MD   Onset Date/Surgical Date --  tennis elbow 05/2015   Hand Dominance Right   Next MD Visit August 12, 2016   Prior Therapy none     Precautions   Precautions --  limit tennis x 2 weeks     Restrictions   Weight Bearing Restrictions No     Balance Screen   Has the patient fallen in the past 6 months No     Home Environment   Living Environment Private residence     Prior Function   Level of Independence Independent     Cognition   Overall Cognitive Status Within Functional Limits  for tasks assessed     Observation/Other Assessments   Focus on Therapeutic Outcomes (FOTO)  35% limitation per Foto     Observation/Other Assessments-Edema    Edema --  moderate edema from lateral epicondyle to mid forearm     Coordination   Gross Motor Movements are Fluid and Coordinated Yes   Fine Motor Movements are Fluid and Coordinated Yes     ROM / Strength   AROM / PROM / Strength AROM;PROM;Strength     AROM   Overall AROM Comments wrist AROM WNL all planes; elbow ROM WNl     PROM   Overall PROM Comments wrist PROM WNL     Strength   Overall Strength Comments 4/5 all except wrist ext 3+/5 R; L wrist strength 5/5 all     Palpation   Palpation comment brachioradialis/extensor carpi radialis tender to palpation; moderate swelling throughout from superior lateral epicondyle to mid brachioradialis/extensors; brachioradialis tight along length            Objective  measurements completed on examination: See above findings.          [facility] Adult PT Treatment/Exercise - 07/31/16 0001      Modalities   Modalities Cryotherapy;Ultrasound     Cryotherapy   Number Minutes Cryotherapy 10 Minutes   Cryotherapy Location --  R lateral epicondyle   Type of Cryotherapy Ice pack     Ultrasound   Ultrasound Location inferior lateral epicondyle   Ultrasound Parameters 20% 1.0 2/cmx x 8 min   Ultrasound Goals --  for pain                PT Education - 07/31/16 0951    Education provided Yes   Education Details advised pt to continue to limit tennis due to pt presentation but if she must play tennis to wear epicondylitis brace; perform icing with ice cube until melts 3-4x/day; and to RICE when not doing championships; perform wrist flex/ext AROM as tolerated x 10 reps 2-3x/day   Person(s) Educated Patient   Methods Explanation;Demonstration   Comprehension Verbalized understanding;Returned demonstration          PT Short Term Goals - 07/31/16 1313      PT SHORT TERM GOAL #1   Title Pt will be I with initial HEP to assist with R lateral epicondylitis pain management in order to return to tennis   Time 3   Period Weeks   Status New     PT SHORT TERM GOAL #2   Title Pt will be able to play tennis x 30 min with 50% less difficulty   Time 3   Period Weeks   Status New     PT SHORT TERM GOAL #3   Title Pt will be able to carry a grocery bag with </= 2/10 R elbow pain   Time 3   Period Weeks   Status New           PT Long Term Goals - 07/31/16 1314      PT LONG TERM GOAL #1   Title Pt will be able to perform all recreational activities with 75% less pain   Time 6   Period Weeks   Status New     PT LONG TERM GOAL #2   Title Pt will have 23% limitation per Foto scoring for decreased impairment in activity tolerance   Time 6   Period Weeks   Status New     PT LONG TERM GOAL #3  Title pt will demo improved R wrist strength  to 4+ to 5/5 all for improving wrist stability with playing tennis   Time 6   Period Weeks   Status New                Plan - 07/31/16 1307    Clinical Impression Statement Pt presents to PT with R lateral epicondylitis with 25% muscle tear. Pt with decreased wrist extension strength, reports difficulty with carryiing items and playing tennis. Pt with moderate swelling from superior to lateral epicondyle to mid brachium. Pt would benefit from PT for manual therapy for decreasing R elbow extensor muscle tightness, strengthening, and pain relief modalities to improve mobility and decrease pain to allow pt to return to rec activities.    Clinical Presentation Stable   Clinical Decision Making Low   Rehab Potential Good   PT Frequency 2x / week   PT Duration 6 weeks   PT Treatment/Interventions Cryotherapy;Electrical Stimulation;Iontophoresis 4mg /ml Dexamethasone;Ultrasound;Therapeutic exercise;Therapeutic activities;Functional mobility training;Patient/family education;Manual techniques;Taping;Dry needling   PT Next Visit Plan review wrist AROM flexion/ext HEP, issue new HEP, [place] for pain relief, taping for edema   PT Home Exercise Plan wrist AROM flex/ext    Consulted and Agree with Plan of Care Patient      Patient will benefit from skilled therapeutic intervention in order to improve the following deficits and impairments:  Decreased activity tolerance, Decreased strength, Increased edema, Impaired UE functional use, Pain  Visit Diagnosis: Pain in right elbow - Plan: PT plan of care cert/re-cert  Muscle weakness (generalized) - Plan: PT plan of care cert/re-cert     Problem List Patient Active Problem List   Diagnosis Date Noted  . Right lateral epicondylitis 06/04/2016  . 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 Restless legs syndrome 03/21/2014  . Vertigo 03/21/2014  . Migraine 03/21/2014  . Right ankle pain 05/29/2010    Thornell Sartorius, PT 07/31/2016, 1:23 PM  [facility] [address], [address] [place], [place], [address] Phone: [phone]   Fax:  [phone]  Name: SAMAIA IWATA MRN: [id number] Date of Birth: Feb 10, 1971

## 2016-08-05 ENCOUNTER — Ambulatory Visit: Payer: 59 | Admitting: Physical Therapy

## 2016-08-05 DIAGNOSIS — M25521 Pain in right elbow: Secondary | ICD-10-CM

## 2016-08-05 DIAGNOSIS — M6281 Muscle weakness (generalized): Secondary | ICD-10-CM

## 2016-08-05 NOTE — Therapy (Signed)
[facility] [address], [address] [place], [place], [address] Phone: [phone]   Fax:  [phone]  Physical Therapy Treatment  Patient Details  Name: Pamela Little MRN: [id number] Date of Birth: [birth year] Referring Provider: Pearletha ForgeHudnall, MD  Encounter Date: 08/05/2016      PT End of Session - 08/05/16 0945    Visit Number 2   Number of Visits 12   Date for PT Re-Evaluation 09/11/16   PT Start Time 0730   PT Stop Time 0800   PT Time Calculation (min) 30 min   Activity Tolerance Patient tolerated treatment well      Past Medical History:  Diagnosis Date  . Migraine 03/21/2014  . Panic attacks   . Restless legs syndrome 03/21/2014  . 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 Vertigo     Past Surgical History:  Procedure Laterality Date  . ABLATION ON ENDOMETRIOSIS    . CESAREAN SECTION WITH BILATERAL TUBAL LIGATION    . MYRINGOTOMY WITH TUBE PLACEMENT    . TONSILLECTOMY      There were no vitals filed for this visit.      Subjective Assessment - 08/05/16 0732    Subjective (P)  I can feel my elbow in the mornings.  It's sore.     Currently in Pain? (P)  Yes   Pain Score (P)  3    Pain Location (P)  Elbow   Pain Orientation (P)  Right                         [facility] Adult PT Treatment/Exercise - 08/05/16 0001      Self-Care   Self-Care Other Self-Care Comments   Other Self-Care Comments  self care strategies for sleeping (wrist support), use of ice;  self massage;  continued use of elbow brace     Elbow Exercises   Other elbow exercises verbal review of previous HEP     Ultrasound   Ultrasound Location right wrist extensor muscle wad (muscle tear location)   Ultrasound Parameters 20% 1.0 w/cm2 8 min   Ultrasound Goals Pain     Iontophoresis   Type of Iontophoresis Dexamethasone   Location right lateral epicondyle   Dose 4 mg/ml    Time 4-6 hour patch                PT Education -  08/05/16 0800    Education provided Yes   Education Details ionto instructions;  dry needling info   Person(s) Educated Patient   Methods Explanation;Handout   Comprehension Verbalized understanding          PT Short Term Goals - 08/05/16 1714      PT SHORT TERM GOAL #1   Title Pt will be I with initial HEP to assist with R lateral epicondylitis pain management in order to return to tennis   Time 3   Period Weeks   Status On-going     PT SHORT TERM GOAL #2   Title Pt will be able to play tennis x 30 min with 50% less difficulty   Time 3   Period Weeks   Status On-going     PT SHORT TERM GOAL #3   Title Pt will be able to carry a grocery bag with </= 2/10 R elbow pain   Time 3   Period Weeks   Status On-going           PT Long Term Goals -  08/05/16 1714      PT LONG TERM GOAL #1   Title Pt will be able to perform all recreational activities with 75% less pain   Time 6   Period Weeks   Status On-going     PT LONG TERM GOAL #2   Title Pt will have 23% limitation per Foto scoring for decreased impairment in activity tolerance   Time 6   Period Weeks   Status On-going     PT LONG TERM GOAL #3   Title pt will demo improved R wrist strength to 4+ to 5/5 all for improving wrist stability with playing tennis   Time 6   Period Weeks   Status On-going               Plan - 08/05/16 0946    Clinical Impression Statement The patient has visible right forearm swelling and tenderness at tendon insertions of lateral elbow.  She reports ultrasound was helpful last visit for pain management.  Multiple tender points noted near extensor insertion on lateral epicondyle and she is receptive to trying dry needling but we agreed to wait until after her tennis tournament starting Thursday through the weekend.     Rehab Potential Good   PT Frequency 2x / week   PT Duration 6 weeks   PT Treatment/Interventions Cryotherapy;Electrical Stimulation;Iontophoresis 4mg /ml  Dexamethasone;Ultrasound;Therapeutic exercise;Therapeutic activities;Functional mobility training;Patient/family education;Manual techniques;Taping;Dry needling   PT Next Visit Plan try kinesiotaping for tennis tournament;  continue ionto trial;  manual therapy;  DN next week      Patient will benefit from skilled therapeutic intervention in order to improve the following deficits and impairments:  Decreased activity tolerance, Decreased strength, Increased edema, Impaired UE functional use, Pain  Visit Diagnosis: Pain in right elbow  Muscle weakness (generalized)     Problem List Patient Active Problem List   Diagnosis Date Noted  . Right lateral epicondylitis 06/04/2016  . 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 Restless legs syndrome 03/21/2014  . Vertigo 03/21/2014  . Migraine 03/21/2014  . Right ankle pain 05/29/2010   Lavinia Sharps, PT 08/05/16 5:17 PM Phone: [phone] Fax: [phone]  [name] 08/05/2016, 5:16 PM  [name] [address] [name], [place], [address] Phone: [phone]   Fax:  [phone]  Name: Pamela Little MRN: [id number] Date of Birth: [birth year]

## 2016-08-05 NOTE — Patient Instructions (Signed)

## 2016-08-07 ENCOUNTER — Ambulatory Visit: Payer: 59 | Admitting: Physical Therapy

## 2016-08-07 ENCOUNTER — Encounter: Payer: Self-pay | Admitting: Physical Therapy

## 2016-08-07 DIAGNOSIS — M6281 Muscle weakness (generalized): Secondary | ICD-10-CM

## 2016-08-07 DIAGNOSIS — M25521 Pain in right elbow: Secondary | ICD-10-CM

## 2016-08-07 NOTE — Therapy (Signed)
[facility] [address], [address] [place], [place], [address] Phone: [phone]   Fax:  [phone]  Physical Therapy Treatment  Patient Details  Name: Pamela Little MRN: [id number] Date of Birth: 04/21/1970 Referring Provider: Pearletha ForgeHudnall, MD  Encounter Date: 08/07/2016      PT End of Session - 08/07/16 0801    Visit Number 3   Number of Visits 12   Date for PT Re-Evaluation 09/11/16   PT Start Time 0801   PT Stop Time 0840   PT Time Calculation (min) 39 min   Activity Tolerance Patient tolerated treatment well   Behavior [facility] Therapy [facility] for tasks assessed/performed      Past Medical History:  Diagnosis Date  . Migraine 03/21/2014  . Panic attacks   . Restless legs syndrome 03/21/2014  . 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 Vertigo     Past Surgical History:  Procedure Laterality Date  . ABLATION ON ENDOMETRIOSIS    . CESAREAN SECTION WITH BILATERAL TUBAL LIGATION    . MYRINGOTOMY WITH TUBE PLACEMENT    . TONSILLECTOMY      There were no vitals filed for this visit.      Subjective Assessment - 08/07/16 0805    Subjective Yesterday it hurt the worst that it has.  I don't know why but it hurts more near the elbow.   Patient Stated Goals to be able to play tennis and use my arm as normal without compensation   Currently in Pain? Yes   Pain Score 5    Pain Location Elbow   Pain Orientation Right;Lateral   Pain Descriptors / Indicators Sharp;Tingling   Pain Type Acute pain   Pain Onset 1 to 4 weeks ago   Pain Frequency Intermittent   Aggravating Factors  anything I'm doing   Pain Relieving Factors icing                         [facility] Adult PT Treatment/Exercise - 08/07/16 0001      Self-Care   Other Self-Care Comments  educated on using mouse with Left side     Elbow Exercises   Other elbow exercises verbal review of stretches     Ultrasound   Ultrasound Location rigth wrist extensor  attachment   Ultrasound Parameters 20%, 1.3 W/cm, 3.3 MHz 7 min   Ultrasound Goals Pain     Manual Therapy   Manual Therapy Soft tissue mobilization;Taping   Manual therapy comments taping to wrist extensor O to I and transverse across attachments   Soft tissue mobilization right wrist extensors                  PT Short Term Goals - 08/05/16 1714      PT SHORT TERM GOAL #1   Title Pt will be I with initial HEP to assist with R lateral epicondylitis pain management in order to return to tennis   Time 3   Period Weeks   Status On-going     PT SHORT TERM GOAL #2   Title Pt will be able to play tennis x 30 min with 50% less difficulty   Time 3   Period Weeks   Status On-going     PT SHORT TERM GOAL #3   Title Pt will be able to carry a grocery bag with </= 2/10 R elbow pain   Time 3   Period Weeks   Status On-going  PT Long Term Goals - 08/05/16 1714      PT LONG TERM GOAL #1   Title Pt will be able to perform all recreational activities with 75% less pain   Time 6   Period Weeks   Status On-going     PT LONG TERM GOAL #2   Title Pt will have 23% limitation per Foto scoring for decreased impairment in activity tolerance   Time 6   Period Weeks   Status On-going     PT LONG TERM GOAL #3   Title pt will demo improved R wrist strength to 4+ to 5/5 all for improving wrist stability with playing tennis   Time 6   Period Weeks   Status On-going               Plan - 08/07/16 0801    Clinical Impression Statement Patient has pain in different location with more tingling today.  Pt has palpable knots in extensors and supinator on right UE.  Pt responded well to manual.  She continues to benefit from skilled PT to facilitate healing and improve tissue length.   Rehab Potential Good   PT Treatment/Interventions Cryotherapy;Electrical Stimulation;Iontophoresis 4mg /ml Dexamethasone;Ultrasound;Therapeutic exercise;Therapeutic activities;Functional  mobility training;Patient/family education;Manual techniques;Taping;Dry needling   PT Next Visit Plan f/u with K-tape, continue manual and ionto and [place] as needed   Consulted and Agree with Plan of Care Patient      Patient will benefit from skilled therapeutic intervention in order to improve the following deficits and impairments:  Decreased activity tolerance, Decreased strength, Increased edema, Impaired UE functional use, Pain  Visit Diagnosis: Pain in right elbow  Muscle weakness (generalized)     Problem List Patient Active Problem List   Diagnosis Date Noted  . Right lateral epicondylitis 06/04/2016  . 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 Restless legs syndrome 03/21/2014  . Vertigo 03/21/2014  . Migraine 03/21/2014  . Right ankle pain 05/29/2010    Vincente Poli, PT 08/07/2016, 8:45 AM  [facility] [address], [address] [place], [place], [address] Phone: [phone]   Fax:  [phone]  Name: Pamela Little MRN: [id number] Date of Birth: 30-Jan-1971

## 2016-08-12 ENCOUNTER — Encounter: Payer: Self-pay | Admitting: Family Medicine

## 2016-08-12 ENCOUNTER — Ambulatory Visit (INDEPENDENT_AMBULATORY_CARE_PROVIDER_SITE_OTHER): Payer: 59 | Admitting: Family Medicine

## 2016-08-12 ENCOUNTER — Ambulatory Visit: Payer: 59 | Admitting: Physical Therapy

## 2016-08-12 DIAGNOSIS — M7711 Lateral epicondylitis, right elbow: Secondary | ICD-10-CM | POA: Diagnosis not present

## 2016-08-12 DIAGNOSIS — M6281 Muscle weakness (generalized): Secondary | ICD-10-CM

## 2016-08-12 DIAGNOSIS — M25521 Pain in right elbow: Secondary | ICD-10-CM | POA: Diagnosis not present

## 2016-08-12 MED ORDER — NITROGLYCERIN 0.2 MG/HR TD PT24: MEDICATED_PATCH | TRANSDERMAL | 1 refills | Status: DC

## 2016-08-12 NOTE — Therapy (Signed)
[facility] [address], [address] [place], [place], [address] Phone: [phone]   Fax:  [phone]  Physical Therapy Treatment  Patient Details  Name: Pamela Little MRN: [id number] Date of Birth: 03-29-1970 Referring Provider: Pearletha Forge, MD  Encounter Date: 08/12/2016      PT End of Session - 08/12/16 0905    Visit Number 4   Number of Visits 12   Date for PT Re-Evaluation 09/11/16   PT Start Time 0731   PT Stop Time 0800   PT Time Calculation (min) 29 min   Activity Tolerance Patient tolerated treatment well      Past Medical History:  Diagnosis Date  . Migraine 03/21/2014  . Panic attacks   . Restless legs syndrome 03/21/2014  . 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 Vertigo     Past Surgical History:  Procedure Laterality Date  . ABLATION ON ENDOMETRIOSIS    . CESAREAN SECTION WITH BILATERAL TUBAL LIGATION    . MYRINGOTOMY WITH TUBE PLACEMENT    . TONSILLECTOMY      There were no vitals filed for this visit.      Subjective Assessment - 08/12/16 0733    Subjective Played in tennis tournament Thurs and Fri and it was OK but I was hurting over the weekend.  My elbow is hurting again.  Lateral epicondyle.     Currently in Pain? Yes   Pain Score 3    Pain Location Elbow   Pain Orientation Right   Pain Type Acute pain   Aggravating Factors  contact when swinging racket; gripping;  stretching it at all                          [facility] Adult PT Treatment/Exercise - 08/12/16 0001      Moist Heat Therapy   Number Minutes Moist Heat 5 Minutes   Moist Heat Location --  right forearm     Iontophoresis   Type of Iontophoresis Dexamethasone   Location right lateral epicondyle   Dose 4 mg/ml #2   Time 4-6 hour patch     Manual Therapy   Soft tissue mobilization right wrist extensors          Trigger Point Dry Needling - 08/12/16 0837    Consent Given? Yes   Education Handout Provided Yes   Muscles Treated Upper Body --  right extensor carpi radialis longus and brevis              PT Education - 08/12/16 0905    Education provided Yes   Education Details DN after care;  review of extensor stretches   Person(s) Educated Patient   Methods Explanation;Demonstration;Handout   Comprehension Verbalized understanding;Returned demonstration          PT Short Term Goals - 08/12/16 0958      PT SHORT TERM GOAL #1   Title Pt will be I with initial HEP to assist with R lateral epicondylitis pain management in order to return to tennis   Time 3   Period Weeks   Status On-going     PT SHORT TERM GOAL #2   Title Pt will be able to play tennis x 30 min with 50% less difficulty   Time 3   Period Weeks   Status On-going     PT SHORT TERM GOAL #3   Title Pt will be able to carry a grocery bag with </= 2/10 R elbow pain  Time 3   Period Weeks   Status On-going           PT Long Term Goals - 08/12/16 0958      PT LONG TERM GOAL #1   Title Pt will be able to perform all recreational activities with 75% less pain   Time 6   Period Weeks   Status On-going     PT LONG TERM GOAL #2   Title Pt will have 23% limitation per Foto scoring for decreased impairment in activity tolerance   Time 6   Period Weeks   Status On-going     PT LONG TERM GOAL #3   Title pt will demo improved R wrist strength to 4+ to 5/5 all for improving wrist stability with playing tennis   Time 6   Period Weeks   Status On-going               Plan - 08/12/16 0908    Clinical Impression Statement The patient reports playing tennis was painful after the 2 day tournament and she plans to take some time off from it to heal.  Continues to be painful with gripping and extending her wrist.  Night time pain.  Tender points in wrist extensor muscle wad.  Good response to DN with improved muscle length noted following.     Rehab Potential Good   PT Frequency 2x / week   PT Duration 6  weeks   PT Treatment/Interventions Cryotherapy;Electrical Stimulation;Iontophoresis 4mg /ml Dexamethasone;Ultrasound;Therapeutic exercise;Therapeutic activities;Functional mobility training;Patient/family education;Manual techniques;Taping;Dry needling   PT Next Visit Plan assess response to first time DN, may add supinator;   continue ionto trial;  Graston myofascial;  mobilizations with movement      Patient will benefit from skilled therapeutic intervention in order to improve the following deficits and impairments:  Decreased activity tolerance, Decreased strength, Increased edema, Impaired UE functional use, Pain  Visit Diagnosis: Pain in right elbow  Muscle weakness (generalized)     Problem List Patient Active Problem List   Diagnosis Date Noted  . Right lateral epicondylitis 06/04/2016  . 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 Restless legs syndrome 03/21/2014  . Vertigo 03/21/2014  . Migraine 03/21/2014  . Right ankle pain 05/29/2010   Lavinia SharpsStacy Lady Wisham, PT 08/12/16 10:00 AM Phone: [phone] Fax: [phone]  Vivien PrestoSimpson, Donevin Sainsbury C 08/12/2016, 10:00 AM  [facility] [address], [address] [place], [place], [address] Phone: [phone]   Fax:  [phone]  Name: Pamela Little MRN: [id number] Date of Birth: March 30, 1970

## 2016-08-12 NOTE — Patient Instructions (Signed)
     Trigger Point Dry Needling  . What is Trigger Point Dry Needling (DN)? o DN is a physical therapy technique used to treat muscle pain and dysfunction. Specifically, DN helps deactivate muscle trigger points (muscle knots).  o A thin filiform needle is used to penetrate the skin and stimulate the underlying trigger point. The goal is for a local twitch response (LTR) to occur and for the trigger point to relax. No medication of any kind is injected during the procedure.   . What Does Trigger Point Dry Needling Feel Like?  o The procedure feels different for each individual patient. Some patients report that they do not actually feel the needle enter the skin and overall the process is not painful. Very mild bleeding may occur. However, many patients feel a deep cramping in the muscle in which the needle was inserted. This is the local twitch response.   . How Will I feel after the treatment? o Soreness is normal, and the onset of soreness may not occur for a few hours. Typically this soreness does not last longer than two days.  o Bruising is uncommon, however; ice can be used to decrease any possible bruising.  o In rare cases feeling tired or nauseous after the treatment is normal. In addition, your symptoms may get worse before they get better, this period will typically not last longer than 24 hours.   . What Can I do After My Treatment? o Increase your hydration by drinking more water for the next 24 hours. o You may place ice or heat on the areas treated that have become sore, however, do not use heat on inflamed or bruised areas. Heat often brings more relief post needling. o You can continue your regular activities, but vigorous activity is not recommended initially after the treatment for 24 hours. o DN is best combined with other physical therapy such as strengthening, stretching, and other therapies.    Stacy Simpson PT [facility] [address], [address] [place], [place] [address] Phone # [phone] Fax [phone] 

## 2016-08-12 NOTE — Patient Instructions (Signed)
Continue physical therapy and do home exercises on days you don't go to therapy. Aleve 2 tabs twice a day with food for pain and inflammation as needed. Nitro patches 1/4th patch to affected area, change daily. Consider topical capsaicin, salon pas, biofreeze up to 4 times a day also. Follow up with me in 6 weeks for reevaluation.

## 2016-08-14 NOTE — Progress Notes (Signed)
PCP: Patient, No Pcp Per  Subjective:   HPI: Patient is a 46 y.o. female here for right elbow pain.  4/16: Patient reports she's had about 2 weeks of right lateral elbow. Pain level currently 1/10, a soreness. Came on after playing tennis, [place] the airport. Bothers when sleeping on this side also. Taking aleve. Worse after playing tennis. Has been rubbing elbow. Right handed. Squeezing something, clicking mouse bother her. No skin changes, numbness.  5/14: Patient reports she was feeling better doing home exercises, resting, hadn't played tennis in over a week. Then was playing tennis on 5/10 with her tennis elbow brace on and hit a volley - felt tight feeling lateral right elbow. + swelling. No bruising. Not taking anything for this currently. Worse with motions of wrist. Pain is 3/10 currently, sharp with movement. No skin changes, numbness.  5/29: Patient reports she feels better. Pain down to 0/10. She is using a sideways mouse. Tried wrist brace initially but seemed to bother her more. Pain is a stiffness, soreness mainly in the morning lateral elbow. Not taking anything currently for this. No skin changes, numbness.  6/26: Patient reports she is improving with PT, dry needling, iontophoresis. She played tennis last Thursday and had pain. Pain level down to 1/10 - was bad after a recent tournament. Is committed to resting from tennis and focusing on rehab. No numbness/tingling, skin changes. Doing home exercises as well. Not taking any medicines for pain.  Past Medical History:  Diagnosis Date  . Migraine 03/21/2014  . Panic attacks   . Restless legs syndrome 03/21/2014  . 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 Vertigo     Current Outpatient Prescriptions on File Prior to Visit  Medication Sig Dispense Refill  . buPROPion (WELLBUTRIN XL) 300 MG 24 hr tablet 300 mg daily.    Marland Kitchen LORazepam (ATIVAN) 1 MG tablet     . rOPINIRole (REQUIP) 1 MG tablet  1 mg daily.    . sertraline (ZOLOFT) 100 MG tablet 2 (two) times daily.     Marland Kitchen topiramate (TOPAMAX) 25 MG tablet TAKE 1 TABLET BY MOUTH AT NIGHT FOR 1 WEEK, THEN TAKE 2 TABLETS AT NIGHT (Patient not taking: Reported on 07/31/2016) 30 tablet 0   No current facility-administered medications on file prior to visit.     Past Surgical History:  Procedure Laterality Date  . ABLATION ON ENDOMETRIOSIS    . CESAREAN SECTION WITH BILATERAL TUBAL LIGATION    . MYRINGOTOMY WITH TUBE PLACEMENT    . TONSILLECTOMY      No Known Allergies  Social History   Social History  . Marital status: Married    Spouse name: N/A  . Number of children: 3  . Years of education: college   Occupational History  . Not on file.   Social History Main Topics  . Smoking status: Never Smoker  . Smokeless tobacco: Never Used  . Alcohol use No  . Drug use: Unknown  . Sexual activity: Not on file   Other Topics Concern  . Not on file   Social History Narrative   Patient is right handed.   Patient drinks 1 cup caffeine daily.    Family History  Problem Relation Age of Onset  . Hypertension Mother   . Heart attack Neg Hx   . Diabetes Neg Hx   . Hypertension Father   . Tremor Sister     BP 116/76   Pulse 61   Ht 5\' 8"  (1.727 m)  Wt 200 lb (90.7 kg)   BMI 30.41 kg/m   Review of Systems: See HPI above.     Objective:  Physical Exam:  Gen: NAD, comfortable in exam room  Right elbow: No gross deformity, swelling, bruising. Mild TTP lateral epicondyle.  No other tenderness. FROM elbow and wrist.  Pain on resisted 3rd digit extension, supination. Collateral ligaments intact. NVI distally.  Left elbow: FROM without pain.   Assessment & Plan:  1. Extensor strain on top of right lateral epicondylitis - clinically improving.  Continue physical therapy, home exercise, relative rest.  Aleve, nitro patches (discussed risks of headache, skin irritation).  Reviewed topical medications.  AF/u in 6  weeks.

## 2016-08-14 NOTE — Assessment & Plan Note (Signed)
Extensor strain on top of right lateral epicondylitis - clinically improving.  Continue physical therapy, home exercise, relative rest.  Aleve, nitro patches (discussed risks of headache, skin irritation).  Reviewed topical medications.  AF/u in 6 weeks.

## 2016-08-15 ENCOUNTER — Ambulatory Visit: Payer: 59 | Admitting: Physical Therapy

## 2016-08-15 DIAGNOSIS — M25521 Pain in right elbow: Secondary | ICD-10-CM | POA: Diagnosis not present

## 2016-08-15 DIAGNOSIS — M6281 Muscle weakness (generalized): Secondary | ICD-10-CM

## 2016-08-15 NOTE — Therapy (Signed)
[facility] [address], [address] [place], [place], [address] Phone: [phone]   Fax:  [phone]  Physical Therapy Treatment  Patient Details  Name: Pamela AspJoanna B Little MRN: [id number] Date of Birth: March 17, 1970 Referring Provider: Pearletha ForgeHudnall, MD  Encounter Date: 08/15/2016      PT End of Session - 08/15/16 0849    Visit Number 5   Number of Visits 12   Date for PT Re-Evaluation 09/11/16   PT Start Time 0802   PT Stop Time 0845   PT Time Calculation (min) 43 min   Activity Tolerance Patient tolerated treatment well      Past Medical History:  Diagnosis Date  . Migraine 03/21/2014  . Panic attacks   . Restless legs syndrome 03/21/2014  . 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 Vertigo     Past Surgical History:  Procedure Laterality Date  . ABLATION ON ENDOMETRIOSIS    . CESAREAN SECTION WITH BILATERAL TUBAL LIGATION    . MYRINGOTOMY WITH TUBE PLACEMENT    . TONSILLECTOMY      There were no vitals filed for this visit.      Subjective Assessment - 08/15/16 0803    Subjective (P)  I had a doctor's appt and he offered the nitro patch b/c it was so bad.   Yesterday wasn't so bad.  Lateral elbow pain today.  Painful with extending wrist and gripping.     Currently in Pain? (P)  Yes   Pain Score (P)  2    Pain Location (P)  Elbow   Pain Orientation (P)  Right   Pain Type (P)  Acute pain                         [facility] Adult PT Treatment/Exercise - 08/15/16 0001      Shoulder Exercises: Standing   Other Standing Exercises facing wall Ts and Ys with middle and lower trap activation     Iontophoresis   Type of Iontophoresis Dexamethasone   Location right lateral epicondyle   Dose 4 mg/ml #3   Time 4-6 hour patch     Manual Therapy   Manual Therapy Joint mobilization   Joint Mobilization supine with head of bed elevated secondary to vertigo mob with movement with wrist extension 10x   Soft tissue  mobilization right wrist extensors and supinator, triceps          Trigger Point Dry Needling - 08/15/16 0848    Consent Given? Yes   Muscles Treated Upper Body --  extensor carpi radialis and brevis, supinator with improved muscle length following              PT Education - 08/15/16 0849    Education provided Yes   Education Details wall Ts and Ys   Person(s) Educated Patient   Methods Explanation;Demonstration   Comprehension Verbalized understanding;Returned demonstration          PT Short Term Goals - 08/15/16 0857      PT SHORT TERM GOAL #1   Title Pt will be I with initial HEP to assist with R lateral epicondylitis pain management in order to return to tennis   Status Achieved     PT SHORT TERM GOAL #2   Title Pt will be able to play tennis x 30 min with 50% less difficulty   Time 3   Period Weeks   Status On-going     PT SHORT TERM GOAL #3  Title Pt will be able to carry a grocery bag with </= 2/10 R elbow pain   Time 3   Period Weeks   Status On-going           PT Long Term Goals - 08/15/16 6045      PT LONG TERM GOAL #1   Title Pt will be able to perform all recreational activities with 75% less pain   Time 6   Period Weeks   Status On-going     PT LONG TERM GOAL #2   Title Pt will have 23% limitation per Foto scoring for decreased impairment in activity tolerance   Time 6   Period Weeks   Status On-going     PT LONG TERM GOAL #3   Title pt will demo improved R wrist strength to 4+ to 5/5 all for improving wrist stability with playing tennis   Time 6   Period Weeks   Status On-going               Plan - 08/15/16 0850    Clinical Impression Statement The patient is going to hold on playing tennis while focusing on her treatment for right elbow pain.  Denies excessive soreness following dry needling but continues to be painful with extending her wrist while her elbow is straight and at night time.  Decreased tender point size  and number compared to previous visits.  Mobilization with movement and manual therapy performed with head of bed elevated secondary to vertigo.  Encouraged scapular stabilization ex's to keep her strong for return to tennis and performed in standing (vs. prone) secondary to vertigo.     Rehab Potential Good   PT Frequency 2x / week   PT Duration 6 weeks   PT Treatment/Interventions Cryotherapy;Electrical Stimulation;Iontophoresis 4mg /ml Dexamethasone;Ultrasound;Therapeutic exercise;Therapeutic activities;Functional mobility training;Patient/family education;Manual techniques;Taping;Dry needling   PT Next Visit Plan assess response toDN #2, may add supinator;   continue ionto trial;  Graston myofascial;  mobilizations with movement;  kinesiotape      Patient will benefit from skilled therapeutic intervention in order to improve the following deficits and impairments:  Decreased activity tolerance, Decreased strength, Increased edema, Impaired UE functional use, Pain  Visit Diagnosis: Pain in right elbow  Muscle weakness (generalized)     Problem List Patient Active Problem List   Diagnosis Date Noted  . Right lateral epicondylitis 06/04/2016  . 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 Restless legs syndrome 03/21/2014  . Vertigo 03/21/2014  . Migraine 03/21/2014  . Right ankle pain 05/29/2010   Lavinia Sharps, PT 08/15/16 9:01 AM Phone: [phone] Fax: [phone]  [name] 08/15/2016, 9:00 AM  [facility] [address], [address] [place], [place], [address] Phone: [phone]   Fax:  [phone]  Name: Pamela Little MRN: [id number] Date of Birth: 09-29-1970

## 2016-08-18 ENCOUNTER — Encounter: Payer: Self-pay | Admitting: Physical Therapy

## 2016-08-18 ENCOUNTER — Ambulatory Visit: Payer: 59 | Attending: Family Medicine | Admitting: Physical Therapy

## 2016-08-18 DIAGNOSIS — M25521 Pain in right elbow: Secondary | ICD-10-CM | POA: Diagnosis not present

## 2016-08-18 DIAGNOSIS — M6281 Muscle weakness (generalized): Secondary | ICD-10-CM | POA: Diagnosis present

## 2016-08-18 NOTE — Therapy (Signed)
[facility] [address], [address] [place], [place], [address] Phone: [phone]   Fax:  [phone]  Physical Therapy Treatment  Patient Details  Name: Pamela Little MRN: [id number] Date of Birth: [birth year] Referring Provider: Pearletha Forge, MD  Encounter Date: 08/18/2016      PT End of Session - 08/18/16 1625    Visit Number 6   Number of Visits 12   Date for PT Re-Evaluation 09/11/16   PT Start Time 1615   PT Stop Time 1655   PT Time Calculation (min) 40 min   Activity Tolerance Patient tolerated treatment well   Behavior During Therapy [facility] for tasks assessed/performed      Past Medical History:  Diagnosis Date  . Migraine 03/21/2014  . Panic attacks   . Restless legs syndrome 03/21/2014  . 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 Vertigo     Past Surgical History:  Procedure Laterality Date  . ABLATION ON ENDOMETRIOSIS    . CESAREAN SECTION WITH BILATERAL TUBAL LIGATION    . MYRINGOTOMY WITH TUBE PLACEMENT    . TONSILLECTOMY      There were no vitals filed for this visit.      Subjective Assessment - 08/18/16 1622    Subjective I still have elbow pain.  Pain with sleeping on right side. I am wearing the nitro patch.    Pertinent History Pt was playing tennis and tore brachioradialis 25% early May ; she stopped playing tennis x 1 month and recently began again due to going to tennis champsionship. Tennis elbow strap was worn and is still being worn to address pain when playing   How long can you sit comfortably? > 1 hour   How long can you stand comfortably? > 1 hour   How long can you walk comfortably? > 1 hour; no limitation   Patient Stated Goals to be able to play tennis and use my arm as normal without compensation   Currently in Pain? Yes   Pain Score 1    Pain Location Elbow   Pain Orientation Right   Pain Descriptors / Indicators Tingling;Sharp   Pain Type Acute pain   Pain Onset 1 to 4 weeks ago   Pain  Frequency Intermittent   Aggravating Factors  writing, playing tennis   Pain Relieving Factors icing, patch   Multiple Pain Sites No            [facility] PT Assessment - 08/18/16 0001      Strength   Overall Strength Comments 4/5 all except wrist ext 3+/5 R; L wrist strength 5/5 all     Palpation   Palpation comment brachioradialis/extensor carpi radialis tender to palpation; moderate swelling throughout from superior lateral epicondyle to mid brachioradialis/extensors; brachioradialis tight along length                     [facility] Adult PT Treatment/Exercise - 08/18/16 0001      Shoulder Exercises: Standing   Other Standing Exercises facing wall Ts and Ys with middle and lower trap activation with therapist assisting the right shoulder to bring shoulder blades together     Modalities   Modalities Iontophoresis     Iontophoresis   Type of Iontophoresis Dexamethasone   Location right lateral epicondyle   Dose 4 mg/ml #4   Time 4-6 hour patch     Manual Therapy   Manual Therapy Soft tissue mobilization   Soft tissue mobilization right wrist extensors and supinator  Trigger Point Dry Needling - 08/18/16 1636    Consent Given? Yes   Education Handout Provided --  twitch response noted   Muscles Treated Upper Body --  brachioradialis; extensor carpi radialis brevis;supinator   Muscles Treated Lower Body --  extensor digitorum                 PT Short Term Goals - 08/18/16 1624      PT SHORT TERM GOAL #2   Title Pt will be able to play tennis x 30 min with 50% less difficulty   Time 3   Period Weeks   Status On-going  have not played in 1 week     PT SHORT TERM GOAL #3   Title Pt will be able to carry a grocery bag with </= 2/10 R elbow pain   Time 3   Period Weeks   Status On-going  does not carry groceries           PT Long Term Goals - 08/15/16 0858      PT LONG TERM GOAL #1   Title Pt will be able to perform all  recreational activities with 75% less pain   Time 6   Period Weeks   Status On-going     PT LONG TERM GOAL #2   Title Pt will have 23% limitation per Foto scoring for decreased impairment in activity tolerance   Time 6   Period Weeks   Status On-going     PT LONG TERM GOAL #3   Title pt will demo improved R wrist strength to 4+ to 5/5 all for improving wrist stability with playing tennis   Time 6   Period Weeks   Status On-going               Plan - 08/18/16 1703    Clinical Impression Statement Patient had a flare-up when she played tennis last so she has not played since then.  Patient has tenderness located on the right lateral elbow joint.  Patient had several twitches in the extensor group.  Patient felt faint after the dry needling.  Patient will benefit from skilled therapy to reduce trigger point and scapular stabilization to improve strength of right wrist extensors.    Rehab Potential Good   PT Frequency 2x / week   PT Duration 6 weeks   PT Treatment/Interventions Cryotherapy;Electrical Stimulation;Iontophoresis 4mg /ml Dexamethasone;Ultrasound;Therapeutic exercise;Therapeutic activities;Functional mobility training;Patient/family education;Manual techniques;Taping;Dry needling   PT Next Visit Plan assess response toDN #3, may add supinator;   continue ionto trial;  Graston myofascial;  mobilizations with movement;  wrist extensor isometric   PT Home Exercise Plan progress as needed   Consulted and Agree with Plan of Care Patient      Patient will benefit from skilled therapeutic intervention in order to improve the following deficits and impairments:  Decreased activity tolerance, Decreased strength, Increased edema, Impaired UE functional use, Pain  Visit Diagnosis: Pain in right elbow  Muscle weakness (generalized)     Problem List Patient Active Problem List   Diagnosis Date Noted  . Right lateral epicondylitis 06/04/2016  . Tremor, essential  03/21/2014  . Restless legs syndrome 03/21/2014  . Vertigo 03/21/2014  . Migraine 03/21/2014  . Right ankle pain 05/29/2010    Eulis Fosterheryl Gray, PT 08/18/16 5:08 PM   [facility] [address], [address] [place], [place], [address] Phone: [phone]   Fax:  [phone]  Name: Pamela Little MRN: [id number] Date  of Birth: 09-26-1970

## 2016-08-21 ENCOUNTER — Ambulatory Visit: Payer: 59 | Admitting: Physical Therapy

## 2016-08-21 ENCOUNTER — Encounter: Payer: 59 | Admitting: Physical Therapy

## 2016-08-21 ENCOUNTER — Encounter: Payer: Self-pay | Admitting: Physical Therapy

## 2016-08-21 DIAGNOSIS — M25521 Pain in right elbow: Secondary | ICD-10-CM | POA: Diagnosis not present

## 2016-08-21 DIAGNOSIS — M6281 Muscle weakness (generalized): Secondary | ICD-10-CM

## 2016-08-21 NOTE — Therapy (Signed)
[facility] [address], [address] [place], [place], [address] Phone: [phone]   Fax:  [phone]  Physical Therapy Treatment  Patient Details  Name: Pamela Little MRN: [id number] Date of Birth: 1970/05/17 Referring Provider: Pearletha ForgeHudnall, MD  Encounter Date: 08/21/2016      PT End of Session - 08/21/16 1611    Visit Number 7   Number of Visits 12   Date for PT Re-Evaluation 09/11/16   PT Start Time 1530   PT Stop Time 1611   PT Time Calculation (min) 41 min   Activity Tolerance Patient tolerated treatment well   Behavior [facility] for tasks assessed/performed      Past Medical History:  Diagnosis Date  . Migraine 03/21/2014  . Panic attacks   . Restless legs syndrome 03/21/2014  . 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 Vertigo     Past Surgical History:  Procedure Laterality Date  . ABLATION ON ENDOMETRIOSIS    . CESAREAN SECTION WITH BILATERAL TUBAL LIGATION    . MYRINGOTOMY WITH TUBE PLACEMENT    . TONSILLECTOMY      There were no vitals filed for this visit.      Subjective Assessment - 08/21/16 1535    Subjective I felt better after therapy.  I had more twitches. I still have soreness in the morning. I am not thinking about the pain as much during work.    Pertinent History Pt was playing tennis and tore brachioradialis 25% early May ; she stopped playing tennis x 1 month and recently began again due to going to tennis champsionship. Tennis elbow strap was worn and is still being worn to address pain when playing   How long can you sit comfortably? > 1 hour   How long can you stand comfortably? > 1 hour   How long can you walk comfortably? > 1 hour; no limitation   Patient Stated Goals to be able to play tennis and use my arm as normal without compensation   Currently in Pain? Yes   Pain Score 1    Pain Location Elbow   Pain Orientation Right   Pain Descriptors / Indicators Aching   Pain Type Acute pain    Pain Onset 1 to 4 weeks ago   Pain Frequency Intermittent   Aggravating Factors  writing, playing tennis, first thing in the morning   Pain Relieving Factors icing, patch                         [facility] Adult PT Treatment/Exercise - 08/21/16 0001      Elbow Exercises   Wrist Extension Strengthening;Right;10 reps  1#; go up fast and down slowly   Other elbow exercises UBE level 0 x 3 min forward   Other elbow exercises wrist supination and pronation with 1# 15x     Shoulder Exercises: Standing   Other Standing Exercises facing wall Ts and Ys, snow angel  with middle and lower trap activation with therapist assisting the right shoulder to bring shoulder blades together     Modalities   Modalities Iontophoresis     Iontophoresis   Type of Iontophoresis Dexamethasone   Location right lateral epicondyle   Dose 4 mg/ml #5   Time 4-6 hour patch     Manual Therapy   Manual Therapy Soft tissue mobilization;Joint mobilization   Manual therapy comments educated patient on using a tennis ball to roll out her forearm and extensor  muscles   Joint Mobilization distraction of radial joint and posterior glide grade 3   Soft tissue mobilization right wrist extensors and supinator          Trigger Point Dry Needling - 08/21/16 1603    Consent Given? Yes   Education Handout Provided --  twitch response noted   Muscles Treated Upper Body --  extensor carpi radialis brevis a; supinator, brachioradialis   Muscles Treated Lower Body --  extensor digitoruim                PT Short Term Goals - 08/18/16 1624      PT SHORT TERM GOAL #2   Title Pt will be able to play tennis x 30 min with 50% less difficulty   Time 3   Period Weeks   Status On-going  have not played in 1 week     PT SHORT TERM GOAL #3   Title Pt will be able to carry a grocery bag with </= 2/10 R elbow pain   Time 3   Period Weeks   Status On-going  does not carry groceries            PT Long Term Goals - 08/15/16 0858      PT LONG TERM GOAL #1   Title Pt will be able to perform all recreational activities with 75% less pain   Time 6   Period Weeks   Status On-going     PT LONG TERM GOAL #2   Title Pt will have 23% limitation per Foto scoring for decreased impairment in activity tolerance   Time 6   Period Weeks   Status On-going     PT LONG TERM GOAL #3   Title pt will demo improved R wrist strength to 4+ to 5/5 all for improving wrist stability with playing tennis   Time 6   Period Weeks   Status On-going               Plan - 08/21/16 1611    Clinical Impression Statement Patient had less muscle twitches today compared to last visit.  Patient has less muscle knots in the right wrist extensors compared to last visit. Patient was able to start gentle strengthening exercises with 1# weight for the wrist.  Patient had a better reaction to dry needling than last visit.  Patient will benefit from skilled therapy to redcue trigger point and scapular stabilization to improve strength of right wrist extensors.    Rehab Potential Good   PT Frequency 2x / week   PT Duration 6 weeks   PT Treatment/Interventions Cryotherapy;Electrical Stimulation;Iontophoresis 4mg /ml Dexamethasone;Ultrasound;Therapeutic exercise;Therapeutic activities;Functional mobility training;Patient/family education;Manual techniques;Taping;Dry needling   PT Next Visit Plan only 1 iontophoresis treatment left. continue with gentle strengthening of right wrist extensors with adding a HEP.    PT Home Exercise Plan progress as needed   Consulted and Agree with Plan of Care Patient      Patient will benefit from skilled therapeutic intervention in order to improve the following deficits and impairments:  Decreased activity tolerance, Decreased strength, Increased edema, Impaired UE functional use, Pain  Visit Diagnosis: Pain in right elbow  Muscle weakness (generalized)     Problem  List Patient Active Problem List   Diagnosis Date Noted  . Right lateral epicondylitis 06/04/2016  . 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 Restless legs syndrome 03/21/2014  . Vertigo 03/21/2014  . Migraine 03/21/2014  . Right ankle pain 05/29/2010    Eulis Foster,  PT 08/21/16 4:16 PM   [facility] [address] [place], [place], [address] Phone: [phone]   Fax:  [phone]  Name: Pamela Little MRN: [id number] Date of Birth: Oct 02, 1970

## 2016-08-25 ENCOUNTER — Ambulatory Visit: Payer: 59 | Admitting: Physical Therapy

## 2016-08-25 ENCOUNTER — Encounter: Payer: Self-pay | Admitting: Physical Therapy

## 2016-08-25 DIAGNOSIS — M25521 Pain in right elbow: Secondary | ICD-10-CM

## 2016-08-25 DIAGNOSIS — M6281 Muscle weakness (generalized): Secondary | ICD-10-CM

## 2016-08-25 NOTE — Therapy (Addendum)
[facility] [address] [place], [place], [address] Phone: [phone]   Fax:  [phone]  Physical Therapy Treatment  Patient Details  Name: Pamela Little MRN: [id number] Date of Birth: May 09, 1970 Referring Provider: Barbaraann Barthel, MD  Encounter Date: 08/25/2016      PT End of Session - 08/25/16 1626    Visit Number 8   Number of Visits 12   Date for PT Re-Evaluation 09/11/16   PT Start Time 1618   PT Stop Time 1709   PT Time Calculation (min) 51 min   Activity Tolerance Patient tolerated treatment well   Behavior During Therapy [facility] for tasks assessed/performed      Past Medical History:  Diagnosis Date  . Migraine 03/21/2014  . Panic attacks   . Restless legs syndrome 03/21/2014  . 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 Vertigo     Past Surgical History:  Procedure Laterality Date  . ABLATION ON ENDOMETRIOSIS    . CESAREAN SECTION WITH BILATERAL TUBAL LIGATION    . MYRINGOTOMY WITH TUBE PLACEMENT    . TONSILLECTOMY      There were no vitals filed for this visit.      Subjective Assessment - 08/25/16 1622    Subjective My arm hurts worst in the morning.  Overall feeling better since the needling.  I just feel it a little but it's not painful.   Pertinent History Pt was playing tennis and tore brachioradialis 25% early May ; she stopped playing tennis x 1 month and recently began again due to going to tennis champsionship. Tennis elbow strap was worn and is still being worn to address pain when playing   Patient Stated Goals to be able to play tennis and use my arm as normal without compensation   Currently in Pain? No/denies                         [facility] Adult PT Treatment/Exercise - 08/25/16 0001      Elbow Exercises   Other elbow exercises UBE level 2 x 3 min forward 3 min back   Other elbow exercises wrist ext and flex stretches     Iontophoresis   Type of Iontophoresis Dexamethasone   Location right lateral epicondyle   Dose 4 mg/ml #6   Time 4-6 hour patch     Manual Therapy   Soft tissue mobilization right wrist extensors and supinator, flexors          Trigger Point Dry Needling - 08/25/16 1713    Consent Given? Yes   Education Handout Provided --  twitch response observed   Muscles Treated Upper Body --  extensors and supinator right forearm                PT Short Term Goals - 08/25/16 1719      PT SHORT TERM GOAL #1   Title Pt will be I with initial HEP to assist with R lateral epicondylitis pain management in order to return to tennis   Time 3   Period Weeks   Status Achieved     PT SHORT TERM GOAL #2   Title Pt will be able to play tennis x 30 min with 50% less difficulty   Time 3   Period Weeks   Status On-going     PT SHORT TERM GOAL #3   Title Pt will be able to carry a grocery bag with </= 2/10 R elbow pain  Time 3   Period Weeks   Status On-going           PT Long Term Goals - 08/15/16 0858      PT LONG TERM GOAL #1   Title Pt will be able to perform all recreational activities with 75% less pain   Time 6   Period Weeks   Status On-going     PT LONG TERM GOAL #2   Title Pt will have 23% limitation per Foto scoring for decreased impairment in activity tolerance   Time 6   Period Weeks   Status On-going     PT LONG TERM GOAL #3   Title pt will demo improved R wrist strength to 4+ to 5/5 all for improving wrist stability with playing tennis   Time 6   Period Weeks   Status On-going               Plan - 08/25/16 1626    Clinical Impression Statement Patient had muscle tightness in wrist flexors on right side that was lengthened with soft tissue.  Pt educated in stretching these muscles as well inorder to aleviate stress on wrist extensors.  Pt given final dose of dexamethasone patch.  Pt will benefit from skilled PT to continue working on trigger point release and forearm strengthening.   Rehab Potential  Good   PT Treatment/Interventions Cryotherapy;Electrical Stimulation;Iontophoresis 56m/ml Dexamethasone;Ultrasound;Therapeutic exercise;Therapeutic activities;Functional mobility training;Patient/family education;Manual techniques;Taping;Dry needling   PT Next Visit Plan no more ionto, cont strengthening  and ROM, soft tissue length.   Consulted and Agree with Plan of Care Patient      Patient will benefit from skilled therapeutic intervention in order to improve the following deficits and impairments:  Decreased activity tolerance, Decreased strength, Increased edema, Impaired UE functional use, Pain  Visit Diagnosis: Pain in right elbow  Muscle weakness (generalized)     Problem List Patient Active Problem List   Diagnosis Date Noted  . Right lateral epicondylitis 06/04/2016  . 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 Restless legs syndrome 03/21/2014  . Vertigo 03/21/2014  . Migraine 03/21/2014  . Right ankle pain 05/29/2010    Pamela Little PT  08/25/2016, 5:22 PM  [facility] [address] [place] [address]: [phone]  Fax:  [phone] Name: Pamela KOORSMRN: [id number] of Birth: 8Jul 27, 1972 PHYSICAL THERAPY DISCHARGE SUMMARY  Visits from Start of Care: 8  Current functional level related to goals / functional outcomes: See above   Remaining deficits: See above   Education / Equipment: HEP  Plan: Patient agrees to discharge.  Patient goals were not met. Patient is being discharged due to not returning since the last visit.  ?????  JGoogle PT 11/12/16 3:01 PM

## 2016-08-27 ENCOUNTER — Encounter: Payer: 59 | Admitting: Physical Therapy

## 2016-09-23 ENCOUNTER — Ambulatory Visit (INDEPENDENT_AMBULATORY_CARE_PROVIDER_SITE_OTHER): Payer: 59 | Admitting: Family Medicine

## 2016-09-23 ENCOUNTER — Encounter: Payer: Self-pay | Admitting: Family Medicine

## 2016-09-23 VITALS — BP 107/69 | HR 61 | Ht 68.0 in | Wt 205.0 lb

## 2016-09-23 DIAGNOSIS — M7711 Lateral epicondylitis, right elbow: Secondary | ICD-10-CM | POA: Diagnosis not present

## 2016-09-23 NOTE — Patient Instructions (Signed)
Restart the physical therapy and do home exercises on days you don't go to therapy. Aleve 2 tabs twice a day with food for pain and inflammation as needed. I don't think I would restart the nitro patches. Consider topical capsaicin, salon pas, biofreeze up to 4 times a day also. Consider cortisone injection if not improving over next 2 months. Follow up with me in 2 months.

## 2016-09-24 NOTE — Assessment & Plan Note (Signed)
Extensor strain on top of right lateral epicondylitis - We discussed options - she's tried PT, home exercises, aleve, nitro patches and continues to have pain.  She would like to do additional PT, home exercises.  Aleve as needed.  Stop nitro as not noted benefit using regularly.  F/u in 2 months.  Consider injection if not improving.

## 2016-09-24 NOTE — Progress Notes (Signed)
PCP: Patient, No Pcp Per  Subjective:   HPI: Patient is a 46 y.o. female here for right elbow pain.  4/16: Patient reports she's had about 2 weeks of right lateral elbow. Pain level currently 1/10, a soreness. Came on after playing tennis, [place] the airport. Bothers when sleeping on this side also. Taking aleve. Worse after playing tennis. Has been rubbing elbow. Right handed. Squeezing something, clicking mouse bother her. No skin changes, numbness.  5/14: Patient reports she was feeling better doing home exercises, resting, hadn't played tennis in over a week. Then was playing tennis on 5/10 with her tennis elbow brace on and hit a volley - felt tight feeling lateral right elbow. + swelling. No bruising. Not taking anything for this currently. Worse with motions of wrist. Pain is 3/10 currently, sharp with movement. No skin changes, numbness.  5/29: Patient reports she feels better. Pain down to 0/10. She is using a sideways mouse. Tried wrist brace initially but seemed to bother her more. Pain is a stiffness, soreness mainly in the morning lateral elbow. Not taking anything currently for this. No skin changes, numbness.  6/26: Patient reports she is improving with PT, dry needling, iontophoresis. She played tennis last Thursday and had pain. Pain level down to 1/10 - was bad after a recent tournament. Is committed to resting from tennis and focusing on rehab. No numbness/tingling, skin changes. Doing home exercises as well. Not taking any medicines for pain.  8/7: Patient reports she's still having pain right elbow laterally. She took a few weeks off and tried playing tennis 2 weeks ago, had pain again. Hurts to sleep on this and use mouse on computer. Doing home exercises still. Wearing compression brace, taking aleve and using tiger balm. Pain level 0/10 currently. No skin changes, numbness.  Past Medical History:  Diagnosis Date  .  Migraine 03/21/2014  . Panic attacks   . Restless legs syndrome 03/21/2014  . 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 Vertigo     Current Outpatient Prescriptions on File Prior to Visit  Medication Sig Dispense Refill  . buPROPion (WELLBUTRIN XL) 300 MG 24 hr tablet 300 mg daily.    Marland Kitchen. LORazepam (ATIVAN) 1 MG tablet     . nitroGLYCERIN (NITRODUR - DOSED IN MG/24 HR) 0.2 mg/hr patch Apply 1/4th patch to affected elbow, change daily 30 patch 1  . rOPINIRole (REQUIP) 1 MG tablet 1 mg daily.    . sertraline (ZOLOFT) 100 MG tablet 2 (two) times daily.     Marland Kitchen. topiramate (TOPAMAX) 25 MG tablet TAKE 1 TABLET BY MOUTH AT NIGHT FOR 1 WEEK, THEN TAKE 2 TABLETS AT NIGHT (Patient not taking: Reported on 07/31/2016) 30 tablet 0   No current facility-administered medications on file prior to visit.     Past Surgical History:  Procedure Laterality Date  . ABLATION ON ENDOMETRIOSIS    . CESAREAN SECTION WITH BILATERAL TUBAL LIGATION    . MYRINGOTOMY WITH TUBE PLACEMENT    . TONSILLECTOMY      No Known Allergies  Social History   Social History  . Marital status: Married    Spouse name: N/A  . Number of children: 3  . Years of education: college   Occupational History  . Not on file.   Social History Main Topics  . Smoking status: Never Smoker  . Smokeless tobacco: Never Used  . Alcohol use No  . Drug use: Unknown  . Sexual activity: Not on file   Other Topics  Concern  . Not on file   Social History Narrative   Patient is right handed.   Patient drinks 1 cup caffeine daily.    Family History  Problem Relation Age of Onset  . Hypertension Mother   . Heart attack Neg Hx   . Diabetes Neg Hx   . Hypertension Father   . Tremor Sister     BP 107/69   Pulse 61   Ht 5\' 8"  (1.727 m)   Wt 205 lb (93 kg)   BMI 31.17 kg/m   Review of Systems: See HPI above.     Objective:  Physical Exam:  Gen: NAD, comfortable in exam room  Right elbow: No gross deformity, swelling, bruising. Mild  TTP lateral epicondyle and just distal to this.  No other tenderness. FROM elbow and wrist.  Pain on resisted 3rd digit extension, wrist extension. Collateral ligaments intact. NVI distally.  Left elbow: FROM without pain.   Assessment & Plan:  1. Extensor strain on top of right lateral epicondylitis - We discussed options - she's tried PT, home exercises, aleve, nitro patches and continues to have pain.  She would like to do additional PT, home exercises.  Aleve as needed.  Stop nitro as not noted benefit using regularly.  F/u in 2 months.  Consider injection if not improving.

## 2017-02-18 ENCOUNTER — Other Ambulatory Visit: Payer: Self-pay | Admitting: Obstetrics & Gynecology

## 2017-02-18 DIAGNOSIS — Z139 Encounter for screening, unspecified: Secondary | ICD-10-CM

## 2017-03-11 ENCOUNTER — Ambulatory Visit
Admission: RE | Admit: 2017-03-11 | Discharge: 2017-03-11 | Disposition: A | Discharge status: Home / Self Care | Payer: 59 | Source: Ambulatory Visit | Attending: Obstetrics & Gynecology | Admitting: Obstetrics & Gynecology

## 2017-03-11 DIAGNOSIS — Z139 Encounter for screening, unspecified: Secondary | ICD-10-CM

## 2017-11-24 ENCOUNTER — Other Ambulatory Visit: Payer: Self-pay | Admitting: Psychiatry

## 2017-12-04 ENCOUNTER — Telehealth: Payer: Self-pay

## 2017-12-04 NOTE — Telephone Encounter (Signed)
Prior approval received on bupropion xl 150mg  3 tablets daily through 12/02/2020

## 2018-01-12 ENCOUNTER — Other Ambulatory Visit: Payer: Self-pay

## 2018-01-12 MED ORDER — BUPROPION HCL ER (XL) 300 MG PO TB24: 300.0000 mg | ORAL_TABLET | Freq: Every day | ORAL | 1 refills | Status: DC

## 2018-01-27 ENCOUNTER — Encounter: Payer: Self-pay | Admitting: Emergency Medicine

## 2018-01-27 DIAGNOSIS — G4700 Insomnia, unspecified: Secondary | ICD-10-CM | POA: Insufficient documentation

## 2018-01-27 DIAGNOSIS — F411 Generalized anxiety disorder: Secondary | ICD-10-CM | POA: Insufficient documentation

## 2018-02-02 ENCOUNTER — Other Ambulatory Visit: Payer: Self-pay | Admitting: Psychiatry

## 2018-02-02 NOTE — Telephone Encounter (Signed)
Need to review paper chart  

## 2018-02-04 ENCOUNTER — Ambulatory Visit (INDEPENDENT_AMBULATORY_CARE_PROVIDER_SITE_OTHER): Payer: 59 | Admitting: Psychiatry

## 2018-02-04 ENCOUNTER — Encounter: Payer: Self-pay | Admitting: Psychiatry

## 2018-02-04 VITALS — BP 129/78 | HR 76

## 2018-02-04 DIAGNOSIS — F411 Generalized anxiety disorder: Secondary | ICD-10-CM | POA: Diagnosis not present

## 2018-02-04 DIAGNOSIS — F5081 Binge eating disorder: Secondary | ICD-10-CM

## 2018-02-04 DIAGNOSIS — G250 Essential tremor: Secondary | ICD-10-CM

## 2018-02-04 DIAGNOSIS — F5105 Insomnia due to other mental disorder: Secondary | ICD-10-CM | POA: Diagnosis not present

## 2018-02-04 DIAGNOSIS — F4001 Agoraphobia with panic disorder: Secondary | ICD-10-CM

## 2018-02-04 DIAGNOSIS — G2581 Restless legs syndrome: Secondary | ICD-10-CM

## 2018-02-04 MED ORDER — BUPROPION HCL ER (XL) 300 MG PO TB24: 300.0000 mg | ORAL_TABLET | Freq: Every day | ORAL | 1 refills | Status: DC

## 2018-02-04 MED ORDER — NALTREXONE HCL 50 MG PO TABS: 25.0000 mg | ORAL_TABLET | Freq: Two times a day (BID) | ORAL | 1 refills | Status: DC

## 2018-02-04 MED ORDER — ROPINIROLE HCL 1 MG PO TABS: 3.0000 mg | ORAL_TABLET | Freq: Every day | ORAL | 1 refills | Status: DC

## 2018-02-04 MED ORDER — PROPRANOLOL HCL 20 MG PO TABS: 20.0000 mg | ORAL_TABLET | Freq: Two times a day (BID) | ORAL | 1 refills | Status: DC | PRN

## 2018-02-04 MED ORDER — ALPRAZOLAM 0.5 MG PO TABS: ORAL_TABLET | ORAL | 1 refills | Status: DC

## 2018-02-04 MED ORDER — SERTRALINE HCL 100 MG PO TABS: 150.0000 mg | ORAL_TABLET | Freq: Every day | ORAL | 1 refills | Status: DC

## 2018-02-04 MED ORDER — PRAMIPEXOLE DIHYDROCHLORIDE ER 3 MG PO TB24: 1.0000 | ORAL_TABLET | Freq: Every day | ORAL | 1 refills | Status: DC

## 2018-02-04 NOTE — Progress Notes (Signed)
Pamela Little [id number] 1970-09-09 47 y.o.  Subjective:   Patient ID:  Pamela Little is a 47 y.o. (DOB 1970-09-09) female.  Chief Complaint:  Chief Complaint  Patient presents with  . Follow-up    Medication Management  . Anxiety  . weight concerns  . Medication Problem    HPI Pamela Little presents to the office today for follow-up ofweight concerns and pharmacy problems and FU anxiety.  Took Wellb 450 am for awhile and did not lose weight or see any benefit or SE.  Emotional eater and stressed by son's mental health and substance abuse problems and refusing school.  Very angry and defiant.  Anticipates placement somewhere.  Drinking and pot.  Chronically anxious.  Hard on the whole family.  Refusing church.  Pt doesn't think changing her med will help.  I keep eating.  Not ususal for her.  Not dep but upset with her son.  No severe panic attacks.  Best very frustrated with her binge eating at this time but feels the stress at home is precipitating it.  Switched Ativan to Xanax and been OK with that.  Not sure re: differences except sleeps better ususally.  RLS controlled and the pramipexole ER at noon helps.  Tremor manageable.  Review of Systems:  Review of Systems  Neurological: Negative for tremors and weakness.  Psychiatric/Behavioral: Negative for agitation, behavioral problems, confusion, decreased concentration, dysphoric mood, hallucinations, self-injury and sleep disturbance. The patient is nervous/anxious. The patient is not hyperactive.     Medications: I have reviewed the patient's current medications.  Current Outpatient Medications  Medication Sig Dispense Refill  . ALPRAZolam (XANAX) 0.5 MG tablet Take 0.5 mg by mouth 2 (two) times daily as needed for anxiety.    Marland Kitchen. buPROPion (WELLBUTRIN XL) 300 MG 24 hr tablet Take 1 tablet (300 mg total) by mouth daily. 90 tablet 1  . naltrexone (DEPADE) 50 MG tablet TAKE ONE-HALF (1/2) TABLET TWICE A DAY 90 tablet 0  .  Pramipexole Dihydrochloride 3 MG TB24 TAKE 1 TABLET BY MOUTH AT NOON  1  . propranolol (INDERAL) 20 MG tablet Take 20 mg by mouth 2 (two) times daily as needed.    Marland Kitchen. rOPINIRole (REQUIP) 1 MG tablet 1 mg daily.    . sertraline (ZOLOFT) 100 MG tablet 150 mg daily.     Marland Kitchen. LORazepam (ATIVAN) 1 MG tablet     . nitroGLYCERIN (NITRODUR - DOSED IN MG/24 HR) 0.2 mg/hr patch Apply 1/4th patch to affected elbow, change daily (Patient not taking: Reported on 02/04/2018) 30 patch 1  . topiramate (TOPAMAX) 25 MG tablet TAKE 1 TABLET BY MOUTH AT NIGHT FOR 1 WEEK, THEN TAKE 2 TABLETS AT NIGHT (Patient not taking: Reported on 07/31/2016) 30 tablet 0   No current facility-administered medications for this visit.     Medication Side Effects: None  Allergies: No Known Allergies  Past Medical History:  Diagnosis Date  . Migraine 03/21/2014  . Panic attacks   . Restless legs syndrome 03/21/2014  . 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 Vertigo     Family History  Problem Relation Age of Onset  . Hypertension Mother   . Heart attack Neg Hx   . Diabetes Neg Hx   . Hypertension Father   . Tremor Sister     Social History   Socioeconomic History  . Marital status: Married    Spouse name: Not on file  . Number of children: 3  . Years of education: college  .  Highest education level: Not on file  Occupational History  . Not on file  Social Needs  . Financial resource strain: Not on file  . Food insecurity:    Worry: Not on file    Inability: Not on file  . Transportation needs:    Medical: Not on file    Non-medical: Not on file  Tobacco Use  . Smoking status: Never Smoker  . Smokeless tobacco: Never Used  Substance and Sexual Activity  . Alcohol use: No  . Drug use: Not on file  . Sexual activity: Not on file  Lifestyle  . Physical activity:    Days per week: Not on file    Minutes per session: Not on file  . Stress: Not on file  Relationships  . Social connections:    Talks on phone: Not on  file    Gets together: Not on file    Attends religious service: Not on file    Active member of club or organization: Not on file    Attends meetings of clubs or organizations: Not on file    Relationship status: Not on file  . Intimate partner violence:    Fear of current or ex partner: Not on file    Emotionally abused: Not on file    Physically abused: Not on file    Forced sexual activity: Not on file  Other Topics Concern  . Not on file  Social History Narrative   Patient is right handed.   Patient drinks 1 cup caffeine daily.    Past Medical History, Surgical history, Social history, and Family history were reviewed and updated as appropriate.   Please see review of systems for further details on the patient's review from today.   Objective:   Physical Exam:  BP 129/78   Pulse 76   Physical Exam Constitutional:      General: She is not in acute distress.    Appearance: She is well-developed. She is obese.  Musculoskeletal:        General: No deformity.  Neurological:     Mental Status: She is alert and oriented to person, place, and time.     Motor: Tremor present.     Coordination: Coordination normal.     Gait: Gait normal.  Psychiatric:        Attention and Perception: Attention and perception normal.        Mood and Affect: Mood is anxious. Mood is not depressed. Affect is not labile, blunt, angry or inappropriate.        Speech: Speech normal.        Behavior: Behavior normal.        Thought Content: Thought content normal. Thought content is not paranoid. Thought content does not include homicidal or suicidal ideation. Thought content does not include homicidal or suicidal plan.        Cognition and Memory: Cognition normal.        Judgment: Judgment normal.     Comments: Insight intact. No auditory or visual hallucinations. No delusions.      Lab Review:  No results found for: NA, K, CL, CO2, GLUCOSE, BUN, CREATININE, CALCIUM, PROT, ALBUMIN, AST,  ALT, ALKPHOS, BILITOT, GFRNONAA, GFRAA  No results found for: WBC, RBC, HGB, HCT, PLT, MCV, MCH, MCHC, RDW, LYMPHSABS, MONOABS, EOSABS, BASOSABS  No results found for: POCLITH, LITHIUM   No results found for: PHENYTOIN, PHENOBARB, VALPROATE, CBMZ   .res Assessment: Plan:    Generalized anxiety disorder  Panic  disorder with agoraphobia  Insomnia due to mental condition  Binge-eating disorder, moderate   Supportive and problem focused therapy on trying to get son psych help.  Disc binge eating.  Consider counseling or support group for this but she is overwhelmed at the moment trying to deal with her son.  We will consider this after he is placed.  Her anxiety is generally managed with medications.  Restless legs is under control with the medicines.  Tremor managed with the meds.  No clear indication for med change. Since the higher dosage of Wellbutrin did not help with weight will leave at 300mg  daily.  May retry higherdose once son is placed.  Consider phentermine.  .25 min appt.  FU 3 mos.  Meredith Staggersarey Cottle, MD, DFAPA   Please see After Visit Summary for patient specific instructions.  No future appointments.  No orders of the defined types were placed in this encounter.     -------------------------------

## 2018-02-12 ENCOUNTER — Other Ambulatory Visit: Payer: Self-pay | Admitting: Obstetrics & Gynecology

## 2018-02-12 DIAGNOSIS — Z1231 Encounter for screening mammogram for malignant neoplasm of breast: Secondary | ICD-10-CM

## 2018-03-16 ENCOUNTER — Ambulatory Visit
Admission: RE | Admit: 2018-03-16 | Discharge: 2018-03-16 | Disposition: A | Discharge status: Home / Self Care | Payer: 59 | Source: Ambulatory Visit | Attending: Obstetrics & Gynecology | Admitting: Obstetrics & Gynecology

## 2018-03-16 DIAGNOSIS — Z1231 Encounter for screening mammogram for malignant neoplasm of breast: Secondary | ICD-10-CM

## 2018-05-06 ENCOUNTER — Ambulatory Visit: Payer: 59 | Admitting: Psychiatry

## 2018-05-11 ENCOUNTER — Other Ambulatory Visit: Payer: Self-pay | Admitting: Psychiatry

## 2018-05-11 MED ORDER — ROPINIROLE HCL 1 MG PO TABS: ORAL_TABLET | ORAL | 1 refills | Status: DC

## 2018-06-18 ENCOUNTER — Telehealth: Payer: 59 | Admitting: Family

## 2018-06-18 ENCOUNTER — Other Ambulatory Visit: Payer: Self-pay

## 2018-06-18 ENCOUNTER — Emergency Department (HOSPITAL_COMMUNITY): Payer: 59

## 2018-06-18 ENCOUNTER — Encounter (HOSPITAL_COMMUNITY): Payer: Self-pay

## 2018-06-18 ENCOUNTER — Emergency Department (HOSPITAL_COMMUNITY)
Admission: EM | Admit: 2018-06-18 | Discharge: 2018-06-18 | Disposition: A | Discharge status: Home / Self Care | Payer: 59 | Attending: Emergency Medicine | Admitting: Emergency Medicine

## 2018-06-18 DIAGNOSIS — J029 Acute pharyngitis, unspecified: Secondary | ICD-10-CM | POA: Diagnosis present

## 2018-06-18 DIAGNOSIS — R0789 Other chest pain: Secondary | ICD-10-CM | POA: Diagnosis not present

## 2018-06-18 DIAGNOSIS — Z79899 Other long term (current) drug therapy: Secondary | ICD-10-CM | POA: Diagnosis not present

## 2018-06-18 DIAGNOSIS — Z20822 Contact with and (suspected) exposure to covid-19: Secondary | ICD-10-CM

## 2018-06-18 DIAGNOSIS — Z20828 Contact with and (suspected) exposure to other viral communicable diseases: Secondary | ICD-10-CM | POA: Diagnosis not present

## 2018-06-18 DIAGNOSIS — R0600 Dyspnea, unspecified: Secondary | ICD-10-CM

## 2018-06-18 DIAGNOSIS — R6889 Other general symptoms and signs: Secondary | ICD-10-CM

## 2018-06-18 LAB — BASIC METABOLIC PANEL
Anion gap: 9 (ref 5–15)
BUN: 16 mg/dL (ref 6–20)
CO2: 24 mmol/L (ref 22–32)
Calcium: 8.8 mg/dL — ABNORMAL LOW (ref 8.9–10.3)
Chloride: 104 mmol/L (ref 98–111)
Creatinine, Ser: 0.96 mg/dL (ref 0.44–1.00)
GFR calc Af Amer: >60 mL/min (ref >60)
GFR calc non Af Amer: >60 mL/min (ref >60)
Glucose, Bld: 107 mg/dL — ABNORMAL HIGH (ref 70–99)
Potassium: 3.4 mmol/L — ABNORMAL LOW (ref 3.5–5.1)
Sodium: 137 mmol/L (ref 135–145)

## 2018-06-18 LAB — CBC
HCT: 41 % (ref 36.0–46.0)
Hemoglobin: 13.4 g/dL (ref 12.0–15.0)
MCH: 30.2 pg (ref 26.0–34.0)
MCHC: 32.7 g/dL (ref 30.0–36.0)
MCV: 92.6 fL (ref 80.0–100.0)
Platelets: 228 10*3/uL (ref 150–400)
RBC: 4.43 MIL/uL (ref 3.87–5.11)
RDW: 13.4 % (ref 11.5–15.5)
WBC: 12.4 10*3/uL — ABNORMAL HIGH (ref 4.0–10.5)
nRBC: 0 % (ref 0.0–0.2)

## 2018-06-18 LAB — PREGNANCY, URINE: Preg Test, Ur: NEGATIVE

## 2018-06-18 LAB — I-STAT BETA HCG BLOOD, ED (MC, WL, AP ONLY): I-stat hCG, quantitative: 5.3 m[IU]/mL — ABNORMAL HIGH (ref <5)

## 2018-06-18 LAB — TROPONIN I: Troponin I: <0.03 ng/mL (ref <0.03)

## 2018-06-18 MED ORDER — BENZONATATE 100 MG PO CAPS: 100.0000 mg | ORAL_CAPSULE | Freq: Three times a day (TID) | ORAL | 0 refills | Status: DC

## 2018-06-18 MED ORDER — SODIUM CHLORIDE 0.9% FLUSH: 3.0000 mL | Freq: Once | INTRAVENOUS | Status: DC

## 2018-06-18 MED ORDER — ACETAMINOPHEN 325 MG PO TABS
650.0000 mg | ORAL_TABLET | Freq: Once | ORAL | Status: AC
  Administered 2018-06-18: 650 mg via ORAL
  Filled 2018-06-18: qty 2

## 2018-06-18 NOTE — ED Provider Notes (Addendum)
[facility]-EMERGENCY DEPT Provider Note   CSN: [id number] Arrival date & time: 06/18/18  1809   History   Chief Complaint Chief Complaint  Patient presents with  . Generalized Body Aches  . Weakness  . Sore Throat  . Chest Pain  . Shortness of Breath    HPI Pamela Little is a 48 y.o. female presenting today for multiple concerns.  Patient reports that yesterday she developed generalized body aches, shortness of breath, chest pain, subjective fever cough productive with yellow sputum.  Patient reports that all of her symptoms began around the same time yesterday and have been constant since onset.  Patient describes her chest pain as a ache on the center of her chest without radiation or alleviating factors she states it is worse with cough and with palpation.  Patient reports her shortness of breath is a feeling as if it is difficult to catch her breath she states that this is occasional and she has experienced it before with allergies, she describes her cough as productive with yellow sputum denies hemoptysis.  Patient describes strange body aches as an aching sensation throughout her entire body.  Patient has not measured her temperature at home however describes subjective fevers and chills.  Patient reports taking 1 Tylenol today around noon with minimal relief of symptoms.  Of note patient reports that her husband has been experiencing similar symptoms last week.  Additionally patient denies hemoptysis, recent injury/surgery, history of blood clot, exogenous hormone use, extremity swelling/color change, history of cancer or recent immobilization.  Patient denies personal history of hypertension, hyper cholesterolemia, diabetes, smoking or family history of heart disease under age of 49, she denies any personal history of atherosclerotic disease.    HPI  Past Medical History:  Diagnosis Date  . Migraine 03/21/2014  . Panic attacks   . Restless legs syndrome  03/21/2014  . 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 Vertigo     Patient Active Problem List   Diagnosis Date Noted  . GAD (generalized anxiety disorder) 01/27/2018  . Insomnia 01/27/2018  . Right lateral epicondylitis 06/04/2016  . 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 Restless legs syndrome 03/21/2014  . Vertigo 03/21/2014  . Migraine 03/21/2014  . Right ankle pain 05/29/2010    Past Surgical History:  Procedure Laterality Date  . ABLATION ON ENDOMETRIOSIS    . CESAREAN SECTION WITH BILATERAL TUBAL LIGATION    . MYRINGOTOMY WITH TUBE PLACEMENT    . TONSILLECTOMY       OB History   No obstetric history on file.      Home Medications    Prior to Admission medications   Medication Sig Start Date End Date Taking? Authorizing Provider  ALPRAZolam Prudy Feeler(XANAX) 0.5 MG tablet 1 twice daily and 1-2 tablets at night as needed for sleep 02/04/18   Cottle, Steva Readyarey G Jr., MD  benzonatate (TESSALON) 100 MG capsule Take 1 capsule (100 mg total) by mouth every 8 (eight) hours. 06/18/18   Harlene SaltsMorelli, Betzalel Umbarger A, PA-C  buPROPion (WELLBUTRIN XL) 300 MG 24 hr tablet Take 1 tablet (300 mg total) by mouth daily. 02/04/18   Cottle, Steva Readyarey G Jr., MD  naltrexone (DEPADE) 50 MG tablet Take 0.5 tablets (25 mg total) by mouth 2 (two) times daily. 02/04/18   Cottle, Steva Readyarey G Jr., MD  nitroGLYCERIN (NITRODUR - DOSED IN MG/24 HR) 0.2 mg/hr patch Apply 1/4th patch to affected elbow, change daily Patient not taking: Reported on 02/04/2018 08/12/16   Lenda KelpHudnall, Shane R, MD  Pramipexole  Dihydrochloride 3 MG TB24 Take 1 tablet (3 mg total) by mouth daily after lunch. 02/04/18   Cottle, Steva Ready., MD  propranolol (INDERAL) 20 MG tablet Take 1 tablet (20 mg total) by mouth 2 (two) times daily as needed. 02/04/18   Cottle, Steva Ready., MD  rOPINIRole (REQUIP) 1 MG tablet 1 tablet at 7 PM and 2 tablets at night 05/11/18   Cottle, Steva Ready., MD  sertraline (ZOLOFT) 100 MG tablet Take 1.5 tablets (150 mg total) by mouth daily. 02/04/18    Cottle, Steva Ready., MD    Family History Family History  Problem Relation Age of Onset  . Hypertension Mother   . Hypertension Father   . Tremor Sister   . Heart attack Neg Hx   . Diabetes Neg Hx     Social History Social History   Tobacco Use  . Smoking status: Never Smoker  . Smokeless tobacco: Never Used  Substance Use Topics  . Alcohol use: No  . Drug use: Never     Allergies   Patient has no known allergies.   Review of Systems Review of Systems  Constitutional: Positive for chills, fatigue and fever.  Respiratory: Positive for cough and shortness of breath.   Cardiovascular: Positive for chest pain. Negative for leg swelling.  Gastrointestinal: Negative for abdominal pain, diarrhea, nausea and vomiting.  Musculoskeletal: Positive for arthralgias and myalgias.  Neurological: Negative.  Negative for weakness and headaches.  All other systems reviewed and are negative.  Physical Exam Updated Vital Signs BP 101/65   Pulse (!) 55   Temp 98.1 F (36.7 C) (Oral)   Resp 18   Ht  (1.727 m)   Wt 108.9 kg   SpO2 96%   BMI 36.49 kg/m   Physical Exam Constitutional:      General: She is not in acute distress.    Appearance: Normal appearance. She is well-developed. She is not ill-appearing or diaphoretic.  HENT:     Head: Normocephalic and atraumatic.     Right Ear: External ear normal.     Left Ear: External ear normal.     Nose: Nose normal.     Mouth/Throat:     Mouth: Mucous membranes are moist.     Pharynx: Oropharynx is clear. Uvula midline.  Eyes:     General: Vision grossly intact. Gaze aligned appropriately.     Pupils: Pupils are equal, round, and reactive to light.  Neck:     Musculoskeletal: Normal range of motion and neck supple.     Trachea: Trachea and phonation normal. No tracheal tenderness or tracheal deviation.  Cardiovascular:     Rate and Rhythm: Normal rate and regular rhythm.     Pulses:          Dorsalis pedis pulses are  2+ on the right side and 2+ on the left side.       Posterior tibial pulses are 2+ on the right side and 2+ on the left side.  Pulmonary:     Effort: Pulmonary effort is normal. No tachypnea, accessory muscle usage or respiratory distress.     Breath sounds: Normal breath sounds. No decreased breath sounds.  Chest:     Chest wall: Tenderness present. No deformity or crepitus.  Abdominal:     General: There is no distension.     Palpations: Abdomen is soft.     Tenderness: There is no abdominal tenderness. There is no guarding or rebound.  Musculoskeletal: Normal  range of motion.     Right lower leg: She exhibits no tenderness. No edema.     Left lower leg: She exhibits no tenderness. No edema.  Skin:    General: Skin is warm and dry.  Neurological:     Mental Status: She is alert.     GCS: GCS eye subscore is 4. GCS verbal subscore is 5. GCS motor subscore is 6.     Comments: Speech is clear and goal oriented, follows commands Major Cranial nerves without deficit, no facial droop Moves extremities without ataxia, coordination intact  Psychiatric:        Behavior: Behavior normal.    ED Treatments / Results  Labs (all labs ordered are listed, but only abnormal results are displayed) Labs Reviewed  BASIC METABOLIC PANEL - Abnormal; Notable for the following components:      Result Value   Potassium 3.4 (*)    Glucose, Bld 107 (*)    Calcium 8.8 (*)    All other components within normal limits  CBC - Abnormal; Notable for the following components:   WBC 12.4 (*)    All other components within normal limits  I-STAT BETA HCG BLOOD, ED ([facility], [facility], [facility] ONLY) - Abnormal; Notable for the following components:   I-stat hCG, quantitative 5.3 (*)    All other components within normal limits  TROPONIN I  PREGNANCY, URINE  POC URINE PREG, ED    EKG EKG Interpretation  Date/Time:  Friday Jun 18 2018 18:28:02 EDT Ventricular Rate:  69 PR Interval:    QRS Duration: 93 QT Interval:   401 QTC Calculation: 430 R Axis:   24 Text Interpretation:  Sinus rhythm Borderline T wave abnormalities Baseline wander in lead(s) II III aVF V3 V4 V5 V6 since last tracing no significant change Confirmed by Mancel Bale [phone]) on 06/18/2018 8:18:15 PM   Radiology Dg Chest 2 View  Result Date: 06/18/2018 CLINICAL DATA:  Shortness of breath for 2 days, sore throat, weakness, chest pain and diaphoresis since yesterday EXAM: CHEST - 2 VIEW COMPARISON:  04/14/2006 FINDINGS: Normal heart size, mediastinal contours, and pulmonary vascularity. Lungs clear. No pleural effusion or pneumothorax. Bones unremarkable. IMPRESSION: Normal exam. Electronically Signed   By: Ulyses Southward M.D.   On: 06/18/2018 19:05    Procedures Procedures (including critical care time)  Medications Ordered in ED Medications  sodium chloride flush (NS) 0.9 % injection 3 mL (has no administration in time range)  acetaminophen (TYLENOL) tablet 650 mg (650 mg Oral Given 06/18/18 1942)     Initial Impression / Assessment and Plan / ED Course  I have reviewed the triage vital signs and the nursing notes.  Pertinent labs & imaging results that were available during my care of the patient were reviewed by me and considered in my medical decision making (see chart for details).    48 year old female presents today for viral-like illness with cough, shortness of breath, generalized body aches and subjective fever since yesterday.  She is also reporting a chest pain that is reproducible on examination with palpation of the chest wall.  She is not tachycardic or hypoxic on room air.  Distal pulses intact in all 4 extremities and equal.  Lungs clear to auscultation bilaterally.  Heart regular rate and rhythm without murmur.  She is low risk by Wells criteria and PERC negative. - Troponin negative CBC with mild leukocytosis of 12.4 BMP potassium 3.4 nonacute Beta-hCG appears false positive Urine pregnancy Negative Chest x-ray:  IMPRESSION:  Normal exam.   EKG: Without significant change reviewed with Dr. Effie Shy - Patient reassessed resting comfortably and in no acute distress.  Vital signs have remained stable throughout emergency department visit without tachycardia or hypoxia.  Blood pressure appears soft however review of historical data reveals a similar blood pressure readings in the past and patient is asymptomatic ambulatory without lightheadedness or dizziness, clinically patient does not appear dehydrated.  On reassessment patient endorses improvement of her symptoms following Tylenol today.  Suspect patient is experiencing a viral illness at this time.  Possibility of COVID-19 however per current guidelines patient does not meet criteria for testing.  As to patient's chest pain/shortness of breath pain is consistently reproducible on examination with palpation of the chest wall, pain has been present since yesterday and she has a negative troponin here in the emergency department today.  Lungs clear to auscultation bilaterally, distal pulses intact in all 4 extremities, regular rate and rhythm without murmur.  Suspect musculoskeletal etiology of patient's chest pain today.  Do not suspect ACS, PE, dissection or other acute cardiopulmonary etiologies of patient's symptoms at this time with negative troponin after onset of pain yesterday, low risk by Wells criteria and PERC negative, heart score low.  I have encouraged the patient to increase her water intake, discussed OTC Tylenol and self-isolation for the next 2 weeks.  Tessalon prescribed for cough.  Patient was evaluated in the context of the global COVID-19 pandemic, which necessitated consideration that the patient might be at risk for infection with the SARS-CoV-2 virus that causes COVID-19. Institutional protocols and algorithms that pertain to the evaluation of patients at risk for COVID-19 are in a state of rapid change based on information released by  regulatory bodies including the CDC and federal and state organizations. These policies and algorithms were followed during the patient's care in the ED. Per current guidelines no testing is indicated at this time.  At this time there does not appear to be any evidence of an acute emergency medical condition and the patient appears stable for discharge with appropriate outpatient follow up. Diagnosis was discussed with patient who verbalizes understanding of care plan and is agreeable to discharge. I have discussed return precautions with patient who verbalizes understanding of return precautions. Patient encouraged to follow-up with their PCP. All questions answered.  Patient has been discharged in good condition.  Patient's case discussed with Dr. Effie Shy who agrees with plan to discharge with PCP follow-up.   Note: Portions of this report may have been transcribed using voice recognition software. Every effort was made to ensure accuracy; however, inadvertent computerized transcription errors may still be present. Final Clinical Impressions(s) / ED Diagnoses   Final diagnoses:  Suspected Covid-19 Virus Infection  Chest wall pain    ED Discharge Orders         Ordered    benzonatate (TESSALON) 100 MG capsule  Every 8 hours     06/18/18 2145           Bill Salinas, PA-C 06/18/18 2157    Bill Salinas, PA-C 06/18/18 2158    Mancel Bale, MD 06/19/18 1041

## 2018-06-18 NOTE — Discharge Instructions (Addendum)
You have been diagnosed today with Suspected Covid-19 and chest wall pain.  At this time there does not appear to be the presence of an emergent medical condition, however there is always the potential for conditions to change. Please read and follow the below instructions.  Please return to the Emergency Department immediately for any new or worsening symptoms or if your symptoms do not improve within 3 days. Please be sure to follow up with your Primary Care Provider within one week regarding your visit today; please call their office to schedule an appointment even if you are feeling better for a follow-up visit. It is likely that your symptoms today are being caused by a viral illness.  It is possible that you are experiencing the COVID-19 virus at this time however per current guidelines testing is not indicated.  However despite inability to test we will treat you as if you are positive for this illness, please self quarantine for the next 2 weeks to avoid potential spread.  Drink plenty of water to avoid dehydration.  You may use over-the-counter Tylenol as directed on the packaging to help with your symptoms.  You may use the Tessalon to help with your cough.  Return to the emergency department for any new or worsening symptoms.  Get help right away if: You have trouble breathing. You have a severe headache or a stiff neck. You have severe vomiting or abdominal pain. Get help right away if: Your chest pain is worse. You have a cough that gets worse, or you cough up blood. You have very bad (severe) pain in your belly (abdomen). You pass out (faint). You have either of these for no clear reason: Sudden chest discomfort. Sudden discomfort in your arms, back, neck, or jaw. You have shortness of breath at any time. You suddenly start to sweat, or your skin gets clammy. You feel sick to your stomach (nauseous). You throw up (vomit). You suddenly feel lightheaded or dizzy. You feel very  weak or tired. Your heart starts to beat fast, or it feels like it is skipping beats. Any new/concerning or worsening symptoms.  Please read the additional information packets attached to your discharge summary.  Do not take your medicine if  develop an itchy rash, swelling in your mouth or lips, or difficulty breathing.  ==============  If you live with, or provide care at home for, a person confirmed to have, or being evaluated for, COVID-19 infection please follow these guidelines to prevent infection:  Follow healthcare providers instructions Make sure that you understand and can help the patient follow any healthcare provider instructions for all care.  Provide for the patients basic needs You should help the patient with basic needs in the home and provide support for getting groceries, prescriptions, and other personal needs.  Monitor the patients symptoms If they are getting sicker, call his or her medical provider a  This will help the healthcare providers office take steps to keep other people from getting infected. Ask the healthcare provider to call the local or state health department.  Limit the number of people who have contact with the patient If possible, have only one caregiver for the patient. Other household members should stay in another home or place of residence. If this is not possible, they should stay in another room, or be separated from the patient as much as possible. Use a separate bathroom, if available. Restrict visitors who do not have an essential need to be in the home.  Keep older adults, very young children, and other sick people away from the patient Keep older adults, very young children, and those who have compromised immune systems or chronic health conditions away from the patient. This includes people with chronic heart, lung, or kidney conditions, diabetes, and cancer.  Ensure good ventilation Make sure that shared spaces in the home  have good air flow, such as from an air conditioner or an opened window, weather permitting.  Wash your hands often Wash your hands often and thoroughly with soap and water for at least 20 seconds. You can use an alcohol based hand sanitizer if soap and water are not available and if your hands are not visibly dirty. Avoid touching your eyes, nose, and mouth with unwashed hands. Use disposable paper towels to dry your hands. If not available, use dedicated cloth towels and replace them when they become wet.  Wear a facemask and gloves Wear a disposable facemask at all times in the room and gloves when you touch or have contact with the patients blood, body fluids, and/or secretions or excretions, such as sweat, saliva, sputum, nasal mucus, vomit, urine, or feces.  Ensure the mask fits over your nose and mouth tightly, and do not touch it during use. Throw out disposable facemasks and gloves after using them. Do not reuse. Wash your hands immediately after removing your facemask and gloves. If your personal clothing becomes contaminated, carefully remove clothing and launder. Wash your hands after handling contaminated clothing. Place all used disposable facemasks, gloves, and other waste in a lined container before disposing them with other household waste. Remove gloves and wash your hands immediately after handling these items.  Do not share dishes, glasses, or other household items with the patient Avoid sharing household items. You should not share dishes, drinking glasses, cups, eating utensils, towels, bedding, or other items After the person uses these items, you should wash them thoroughly with soap and water.  Wash laundry thoroughly Immediately remove and wash clothes or bedding that have blood, body fluids, and/or secretions or excretions, such as sweat, saliva, sputum, nasal mucus, vomit, urine, or feces, on them. Wear gloves when handling laundry from the patient. Read and follow  directions on labels of laundry or clothing items and detergent. In general, wash and dry with the warmest temperatures recommended on the label.  Clean all areas the individual has used often Clean all touchable surfaces, such as counters, tabletops, doorknobs, bathroom fixtures, toilets, phones, keyboards, tablets, and bedside tables, every day. Also, clean any surfaces that may have blood, body fluids, and/or secretions or excretions on them. Wear gloves when cleaning surfaces the patient has come in contact with. Use a diluted bleach solution (e.g., dilute bleach with 1 part bleach and 10 parts water) or a household disinfectant with a label that says EPA-registered for coronaviruses. To make a bleach solution at home, add 1 tablespoon of bleach to 1 quart (4 cups) of water. For a larger supply, add  cup of bleach to 1 gallon (16 cups) of water. Read labels of cleaning products and follow recommendations provided on product labels. Labels contain instructions for safe and effective use of the cleaning product including precautions you should take when applying the product, such as wearing gloves or eye protection and making sure you have good ventilation during use of the product. Remove gloves and wash hands immediately after cleaning.  Monitor yourself for signs and symptoms of illness Caregivers and household members are considered close contacts, should monitor  their health, and will be asked to limit movement outside of the home to the extent possible. Follow the monitoring steps for close contacts listed on the symptom monitoring form.   ? If you have additional questions, contact your local health department or call the epidemiologist on call at [phone] (available 24/7). ? This guidance is subject to change. For the most up-to-date guidance from [facility], please refer to their website: [employer]hu

## 2018-06-18 NOTE — ED Triage Notes (Signed)
Patient c/o SOB x 2 days. Patient c/o sore throat, weakness, chest pain, and diaphoresis since yesterday.

## 2018-06-18 NOTE — ED Notes (Signed)
Bed: WA17 Expected date:  Expected time:  Means of arrival:  Comments: 81F fever

## 2018-06-18 NOTE — ED Notes (Signed)
Lab called at this time to run urine preg

## 2018-06-18 NOTE — Progress Notes (Signed)
Based on what you shared with me, I feel your condition warrants further evaluation and I recommend that you be seen for a face to face office visit.     NOTE: If you entered your credit card information for this eVisit, you will not be charged. You may see a "hold" on your card for the $35 but that hold will drop off and you will not have a charge processed.  If you are having a true medical emergency please call 911.  If you need an urgent face to face visit, [facility] has four urgent care centers for your convenience.    PLEASE NOTE: THE [facility] LOCATIONS AND URGENT CARE CLINICS DO NOT HAVE THE TESTING FOR CORONAVIRUS COVID19 AVAILABLE.  IF YOU FEEL YOU NEED THIS TEST YOU MUST GO TO A TRIAGE LOCATION AT ONE OF THE HOSPITAL EMERGENCY DEPARTMENTS   [email or web address] to reserve your spot online an avoid wait times  [facility] [address], [address] [place], [place] [address] Modified hours of operation: Monday-Friday, 12 PM to 6 PM  Saturday & Sunday 10 AM to 4 PM *Across the street from [employer]  [facility] (New Address!) [address], [address] [place], [place] [address] *Just off [address], across the road from [employer]* Modified hours of operation: Monday-Friday, 12 PM to 6 PM  Closed Saturday & Sunday  [facility]'s modified hours of operation will be in effect from May 1 until May 31   The following sites will take your insurance:  . [facility]  [phone] Get Driving Directions Find a Provider at this Location  [address] [place], [place] [address] . 10 am to 8 pm Monday-Friday . 12 pm to 8 pm Saturday-Sunday   . [facility]  [phone] Get Driving Directions Find a Provider at this Location  [address], [address] [place], [place] [address] . 8 am to 8 pm Monday-Friday . 9 am to 6 pm Saturday . 11 am to 6 pm Sunday   . [facility] [place]  [phone] Get Driving Directions  [address] [place], [place] [address] . 8 am to 8 pm Monday-Friday . 8 am to 4 pm Saturday-Sunday   Your e-visit answers were reviewed by a board certified advanced clinical practitioner to complete your personal care plan.  Thank you for using e-Visits. 

## 2018-06-18 NOTE — ED Notes (Signed)
Bed: WTR5 Expected date:  Expected time:  Means of arrival:  Comments: 

## 2018-06-22 ENCOUNTER — Other Ambulatory Visit: Payer: Self-pay

## 2018-06-22 MED ORDER — SERTRALINE HCL 100 MG PO TABS: 150.0000 mg | ORAL_TABLET | Freq: Every day | ORAL | 0 refills | Status: DC

## 2018-07-13 ENCOUNTER — Other Ambulatory Visit: Payer: Self-pay

## 2018-07-13 ENCOUNTER — Telehealth: Payer: Self-pay | Admitting: Psychiatry

## 2018-07-13 MED ORDER — ALPRAZOLAM 0.5 MG PO TABS: ORAL_TABLET | ORAL | 0 refills | Status: DC

## 2018-07-13 NOTE — Telephone Encounter (Signed)
Dr. Jennelle Human should have signed the PA this morning and faxed by office staff

## 2018-07-13 NOTE — Telephone Encounter (Signed)
Not needed

## 2018-07-13 NOTE — Telephone Encounter (Signed)
Patient called and her xanax needs a prior authorization. She is going to be out tonight. [facility] is going to try to override so pharmacy can fill an order why the pa gets done.. Call this number  [phone] to get it started. Quanity of 360 to high needs to be 270 . So 3 days instead of 4 day for a 90 supply. Please send it to [facility] pharmacy 30 day supply until this gets changed

## 2018-07-15 ENCOUNTER — Other Ambulatory Visit: Payer: Self-pay

## 2018-07-15 ENCOUNTER — Encounter: Payer: Self-pay | Admitting: Psychiatry

## 2018-07-15 ENCOUNTER — Ambulatory Visit (INDEPENDENT_AMBULATORY_CARE_PROVIDER_SITE_OTHER): Payer: 59 | Admitting: Psychiatry

## 2018-07-15 DIAGNOSIS — F5081 Binge eating disorder: Secondary | ICD-10-CM

## 2018-07-15 DIAGNOSIS — F411 Generalized anxiety disorder: Secondary | ICD-10-CM | POA: Diagnosis not present

## 2018-07-15 DIAGNOSIS — F4001 Agoraphobia with panic disorder: Secondary | ICD-10-CM

## 2018-07-15 DIAGNOSIS — F5105 Insomnia due to other mental disorder: Secondary | ICD-10-CM

## 2018-07-15 DIAGNOSIS — G2581 Restless legs syndrome: Secondary | ICD-10-CM

## 2018-07-15 DIAGNOSIS — G250 Essential tremor: Secondary | ICD-10-CM

## 2018-07-15 MED ORDER — ALPRAZOLAM 1 MG PO TABS: ORAL_TABLET | ORAL | 1 refills | Status: DC

## 2018-07-15 NOTE — Telephone Encounter (Signed)
I change the prescription to accommodate quantity limits

## 2018-07-15 NOTE — Progress Notes (Signed)
Pamela Little [id number] [birth year] 48 y.o.  Virtual Visit via Telephone Note  I connected with pt by telephone and verified that I am speaking with the correct person using two identifiers.   I discussed the limitations, risks, security and privacy concerns of performing an evaluation and management service by telephone and the availability of in person appointments. I also discussed with the patient that there may be a patient responsible charge related to this service. The patient expressed understanding and agreed to proceed.  I discussed the assessment and treatment plan with the patient. The patient was provided an opportunity to ask questions and all were answered. The patient agreed with the plan and demonstrated an understanding of the instructions.   The patient was advised to call back or seek an in-person evaluation if the symptoms worsen or if the condition fails to improve as anticipated.  I provided 25 minutes of non-face-to-face time during this encounter. The call started at 945 and ended at 1010. The patient was located at home and the provider was located office.  Subjective:   Patient ID:  Pamela Little is a 48 y.o. (DOB 12-Dec-1970) female.  Chief Complaint:  Chief Complaint  Patient presents with  . Anxiety    Medication Management   . Follow-up    Medication Management   . Medication Refill    New Rx for Xanax for #240 so no PA is needed  . Sleeping Problem    Anxiety  Symptoms include nervous/anxious behavior. Patient reports no confusion or decreased concentration.    Medication Refill  Pertinent negatives include no weakness.   Pamela Little presents to the office today for follow-up ofweight concerns and pharmacy problems and FU anxiety.  Last seen February 04, 2018.  No meds were changed at that visit.  Had suspected Covid but tested negative.  Mostly recovered except still SOB.  Has been a little anxious and been stressed with H sick with  leukemia.  Son still driving them nuts but should get better with end of school.    Definitely anxious.  Struggles with weight.  Plans to see GYN   Trouble staying asleep and 5 hours average, not enough.  Some awakening with anxiety and worrying over son being out and whether he's safe.  Typical Xanax 0.5mg  TID  Took Wellb 450 am for awhile and did not lose weight or see any benefit or SE.  Emotional eater and stressed by son's mental health and substance abuse problems and refusing school.  Very angry and defiant.  Anticipates placement somewhere.  Drinking and pot.  Chronically anxious.  Hard on the whole family.  Refusing church.  Pt doesn't think changing her med will help.  I keep eating.  Not ususal for her.  Not dep but upset with her son.  No severe panic attacks.  Best very frustrated with her binge eating at this time but feels the stress at home is precipitating it.  Switched Ativan to Xanax and been OK with that.  Not sure re: differences except sleeps better ususally.  RLS controlled and the pramipexole ER at noon helps.   Tremor manageable.  Stress H with Leukemia and recent hospitalization for heart problems.  Past Psychiatric Medication Trials: Fluoxetine, Wellbutrin, topiramate 200 fatigue, venlafaxine, sertraline 300, Lexapro 30, fluvoxamine, buspirone, clonazepam, Abilify, Vistaril, propranolol, Mirapex, naltrexone, lorazepam, Xanax  Review of Systems:  Review of Systems  Neurological: Negative for tremors and weakness.  Psychiatric/Behavioral: Negative for agitation, behavioral problems, confusion, decreased  concentration, dysphoric mood, hallucinations, self-injury and sleep disturbance. The patient is nervous/anxious. The patient is not hyperactive.     Medications: I have reviewed the patient's current medications.  Current Outpatient Medications  Medication Sig Dispense Refill  . buPROPion (WELLBUTRIN XL) 300 MG 24 hr tablet Take 1 tablet (300 mg total) by mouth  daily. 90 tablet 1  . naltrexone (DEPADE) 50 MG tablet Take 0.5 tablets (25 mg total) by mouth 2 (two) times daily. 90 tablet 1  . Pramipexole Dihydrochloride 3 MG TB24 Take 1 tablet (3 mg total) by mouth daily after lunch. 90 tablet 1  . propranolol (INDERAL) 20 MG tablet Take 1 tablet (20 mg total) by mouth 2 (two) times daily as needed. 180 tablet 1  . rOPINIRole (REQUIP) 1 MG tablet 1 tablet at 7 PM and 2 tablets at night 270 tablet 1  . sertraline (ZOLOFT) 100 MG tablet Take 1.5 tablets (150 mg total) by mouth daily. 135 tablet 0   No current facility-administered medications for this visit.     Medication Side Effects: None  Allergies: No Known Allergies  Past Medical History:  Diagnosis Date  . Migraine 03/21/2014  . Panic attacks   . Restless legs syndrome 03/21/2014  . 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 Vertigo     Family History  Problem Relation Age of Onset  . Hypertension Mother   . Hypertension Father   . Tremor Sister   . Heart attack Neg Hx   . Diabetes Neg Hx     Social History   Socioeconomic History  . Marital status: Married    Spouse name: Not on file  . Number of children: 3  . Years of education: college  . Highest education level: Not on file  Occupational History  . Not on file  Social Needs  . Financial resource strain: Not on file  . Food insecurity:    Worry: Not on file    Inability: Not on file  . Transportation needs:    Medical: Not on file    Non-medical: Not on file  Tobacco Use  . Smoking status: Never Smoker  . Smokeless tobacco: Never Used  Substance and Sexual Activity  . Alcohol use: No  . Drug use: Never  . Sexual activity: Not on file  Lifestyle  . Physical activity:    Days per week: Not on file    Minutes per session: Not on file  . Stress: Not on file  Relationships  . Social connections:    Talks on phone: Not on file    Gets together: Not on file    Attends religious service: Not on file    Active member of club  or organization: Not on file    Attends meetings of clubs or organizations: Not on file    Relationship status: Not on file  . Intimate partner violence:    Fear of current or ex partner: Not on file    Emotionally abused: Not on file    Physically abused: Not on file    Forced sexual activity: Not on file  Other Topics Concern  . Not on file  Social History Narrative   Patient is right handed.   Patient drinks 1 cup caffeine daily.    Past Medical History, Surgical history, Social history, and Family history were reviewed and updated as appropriate.   Please see review of systems for further details on the patient's review from today.   Objective:   Physical Exam:  There were no vitals taken for this visit.  Physical Exam Neurological:     Mental Status: She is alert and oriented to person, place, and time.     Cranial Nerves: No dysarthria.  Psychiatric:        Attention and Perception: Attention normal.        Mood and Affect: Mood is anxious. Mood is not depressed.        Speech: Speech normal.        Behavior: Behavior is cooperative.        Thought Content: Thought content normal. Thought content is not paranoid or delusional. Thought content does not include homicidal or suicidal ideation. Thought content does not include homicidal or suicidal plan.        Cognition and Memory: Cognition and memory normal.        Judgment: Judgment normal.     Comments: Insight pretty good. She is chronically anxious it is never been completely controlled.  It is a little worse than usual situationally.     Lab Review:     Component Value Date/Time   NA 137 06/18/2018 1901   K 3.4 (L) 06/18/2018 1901   CL 104 06/18/2018 1901   CO2 24 06/18/2018 1901   GLUCOSE 107 (H) 06/18/2018 1901   BUN 16 06/18/2018 1901   CREATININE 0.96 06/18/2018 1901   CALCIUM 8.8 (L) 06/18/2018 1901   GFRNONAA >60 06/18/2018 1901   GFRAA >60 06/18/2018 1901       Component Value Date/Time   WBC  12.4 (H) 06/18/2018 1901   RBC 4.43 06/18/2018 1901   HGB 13.4 06/18/2018 1901   HCT 41.0 06/18/2018 1901   PLT 228 06/18/2018 1901   MCV 92.6 06/18/2018 1901   MCH 30.2 06/18/2018 1901   MCHC 32.7 06/18/2018 1901   RDW 13.4 06/18/2018 1901    No results found for: POCLITH, LITHIUM   No results found for: PHENYTOIN, PHENOBARB, VALPROATE, CBMZ   .res Assessment: Plan:    Generalized anxiety disorder  Insomnia due to mental condition  Panic disorder with agoraphobia  Binge-eating disorder, moderate  Restless legs syndrome  Tremor, essential   Supportive and problem focused therapy on trying to get son psych help.  Disc binge eating.  Consider counseling or support group for this but she is overwhelmed at the moment trying to deal with her son.  We will consider this after he is placed.  Her anxiety is less well managed with medications but a lot of it has to do with external stressors including her son and COVID-19 and her husband's leukemia.  Restless legs is under control with the medicines.  Tremor managed with the meds.  DT insomnia increase Xanax 0.5 mg BID and  HS.  Change to 1 mg Xanax DT QL.  We discussed the short-term risks associated with benzodiazepines including sedation and increased fall risk among others.  Discussed long-term side effect risk including dependence, potential withdrawal symptoms, and the potential eventual dose-related risk of dementia.   Since the higher dosage of Wellbutrin did not help with weight will leave at  daily.  May retry higherdose once son is placed.  Consider phentermine.  .25 min appt.  FU 3 mos.  Meredith Staggers, MD, DFAPA   Please see After Visit Summary for patient specific instructions.  No future appointments.  No orders of the defined types were placed in this encounter.     -------------------------------

## 2018-07-17 ENCOUNTER — Other Ambulatory Visit: Payer: Self-pay | Admitting: Psychiatry

## 2018-07-30 ENCOUNTER — Other Ambulatory Visit: Payer: Self-pay

## 2018-07-30 MED ORDER — PRAMIPEXOLE DIHYDROCHLORIDE ER 3 MG PO TB24: 1.0000 | ORAL_TABLET | Freq: Every day | ORAL | 1 refills | Status: DC

## 2018-08-03 ENCOUNTER — Other Ambulatory Visit: Payer: Self-pay | Admitting: Psychiatry

## 2018-08-07 ENCOUNTER — Other Ambulatory Visit: Payer: Self-pay | Admitting: Psychiatry

## 2018-09-07 ENCOUNTER — Other Ambulatory Visit: Payer: Self-pay

## 2018-09-07 ENCOUNTER — Ambulatory Visit
Admission: RE | Admit: 2018-09-07 | Discharge: 2018-09-07 | Disposition: A | Discharge status: Home / Self Care | Payer: 59 | Source: Ambulatory Visit | Attending: Family Medicine | Admitting: Family Medicine

## 2018-09-07 ENCOUNTER — Other Ambulatory Visit: Payer: Self-pay | Admitting: Family Medicine

## 2018-09-07 DIAGNOSIS — R0602 Shortness of breath: Secondary | ICD-10-CM

## 2018-09-15 ENCOUNTER — Other Ambulatory Visit: Payer: Self-pay | Admitting: Psychiatry

## 2018-10-07 ENCOUNTER — Telehealth: Payer: Self-pay | Admitting: Psychiatry

## 2018-10-07 ENCOUNTER — Other Ambulatory Visit: Payer: Self-pay

## 2018-10-07 MED ORDER — SERTRALINE HCL 100 MG PO TABS: 150.0000 mg | ORAL_TABLET | Freq: Every day | ORAL | 0 refills | Status: DC

## 2018-10-07 NOTE — Telephone Encounter (Signed)
Pt lost sertraline. Thinks she threw it away. Would like to see if she can get more called in at [facility].

## 2018-10-07 NOTE — Telephone Encounter (Signed)
Early refill submitted for Sertraline 100 mg 1.5 tablets daily, patient may have to pay out of pocket due to early refill.

## 2018-10-07 NOTE — Telephone Encounter (Signed)
Patient has lost bottle of Sertraline asking for refill to be submitted to [facility]. Will submit but patient may have to pay out of pocket due to early refill.

## 2018-10-22 ENCOUNTER — Other Ambulatory Visit: Payer: Self-pay | Admitting: Psychiatry

## 2018-11-17 ENCOUNTER — Other Ambulatory Visit: Payer: Self-pay

## 2018-11-17 ENCOUNTER — Ambulatory Visit (INDEPENDENT_AMBULATORY_CARE_PROVIDER_SITE_OTHER): Payer: 59 | Admitting: Psychiatry

## 2018-11-17 ENCOUNTER — Encounter: Payer: Self-pay | Admitting: Psychiatry

## 2018-11-17 DIAGNOSIS — F411 Generalized anxiety disorder: Secondary | ICD-10-CM | POA: Diagnosis not present

## 2018-11-17 DIAGNOSIS — F5081 Binge eating disorder: Secondary | ICD-10-CM

## 2018-11-17 DIAGNOSIS — F5105 Insomnia due to other mental disorder: Secondary | ICD-10-CM

## 2018-11-17 DIAGNOSIS — F4001 Agoraphobia with panic disorder: Secondary | ICD-10-CM

## 2018-11-17 DIAGNOSIS — G250 Essential tremor: Secondary | ICD-10-CM

## 2018-11-17 DIAGNOSIS — G2581 Restless legs syndrome: Secondary | ICD-10-CM

## 2018-11-17 MED ORDER — ALPRAZOLAM 0.5 MG PO TABS: ORAL_TABLET | ORAL | 1 refills | Status: DC

## 2018-11-17 NOTE — Progress Notes (Signed)
Pamela Little [id number] 10/15/1970 48 y.o.  Virtual Visit via Telephone Note  I connected with pt by telephone and verified that I am speaking with the correct person using two identifiers.   I discussed the limitations, risks, security and privacy concerns of performing an evaluation and management service by telephone and the availability of in person appointments. I also discussed with the patient that there may be a patient responsible charge related to this service. The patient expressed understanding and agreed to proceed.  I discussed the assessment and treatment plan with the patient. The patient was provided an opportunity to ask questions and all were answered. The patient agreed with the plan and demonstrated an understanding of the instructions.   The patient was advised to call back or seek an in-person evaluation if the symptoms worsen or if the condition fails to improve as anticipated.  I provided 25 minutes of non-face-to-face time during this encounter. The call started at 430  and ended at 5. The patient was located at home and the provider was located office.  Subjective:   Patient ID:  Pamela Little is a 48 y.o. (DOB 1970-08-05) female.  Chief Complaint:  Chief Complaint  Patient presents with  . Follow-up    Medication Management  . Anxiety    Medication Management  . Sleeping Problem    Anxiety Symptoms include nervous/anxious behavior. Patient reports no confusion or decreased concentration.    Medication Refill Associated symptoms include congestion. Pertinent negatives include no weakness.   [name] presents to the office today for follow-up ofweight concerns and pharmacy problems and FU anxiety.  Last seen May 2020.  She was having insomnia as we increased the Xanax to 0.5 mg twice daily and 1 mg nightly.  She continued Wellbutrin XL 300 mg every morning, sertraline 150, ropinirole for restless legs, propranolol as needed for anxiety,  naltrexone for weight gain. Also taking pramipexole ER.    Has been told she's in menopause and is having sweating and hot flashes.  Sleep comes and goes and is not consistent.   Overall mentally OK for the most part.  Can be chronically tired.  Always upset with her weight.  Working normal 40 + hours.  Chronically stressed.  Has been a little anxious and been stressed with H sick with leukemia.  Son 46 yo still driving them nuts and not doing school.  Still struggling big time with him.  That will make her weepy.      Definitely anxious.  Struggles with weight.  Plans to see GYN   Trouble staying asleep and 5 hours average, not enough.  Some awakening with anxiety and worrying over son being out and whether he's safe.  Typical Xanax 0.5mg  TID  Took Wellb 450 am for awhile and did not lose weight or see any benefit or SE.  Emotional eater and stressed by son's mental health and substance abuse problems and refusing school.  Very angry and defiant.  Anticipates placement somewhere.  Drinking and pot.  Chronically anxious.  Hard on the whole family.  Refusing church.  Pt doesn't think changing her med will help.  I keep eating.  Not ususal for her.  Not dep but upset with her son.  No severe panic attacks.  Best very frustrated with her binge eating at this time but feels the stress at home is precipitating it.  Switched Ativan to Xanax and been OK with that.  Not sure re: differences except sleeps better  ususally.  RLS controlled and the pramipexole ER at noon helps.   Tremor manageable.  Stress H with Leukemia and recent hospitalization for heart problems.  Past Psychiatric Medication Trials: Fluoxetine, Wellbutrin, topiramate 200 fatigue, venlafaxine, sertraline 300, Lexapro 30, fluvoxamine, buspirone, clonazepam, lorazepam, Xanax Abilify, Vistaril, propranolol, Mirapex, naltrexone,  Pramipexole only interfered with sleep. Ropinirole only was too fatiguing. So taking the mixture of the 2 for  severe RLS Failed iron trials for RLS  Review of Systems:  Review of Systems  HENT: Positive for congestion.   Neurological: Negative for tremors and weakness.  Psychiatric/Behavioral: Negative for agitation, behavioral problems, confusion, decreased concentration, dysphoric mood, hallucinations, self-injury and sleep disturbance. The patient is nervous/anxious. The patient is not hyperactive.     Medications: I have reviewed the patient's current medications.  Current Outpatient Medications  Medication Sig Dispense Refill  . ADVAIR DISKUS 250-50 MCG/DOSE AEPB TAKE 1 PUFF BY MOUTH TWICE A DAY    . albuterol (VENTOLIN HFA) 108 (90 Base) MCG/ACT inhaler TAKE 2 PUFFS BY MOUTH EVERY 4 HOURS AS NEEDED    . ALPRAZolam (XANAX) 0.5 MG tablet TAKE 1 TABLET BY MOUTH TWICE A DAY AND 1-2 TABS AT BEDTIME AS NEEDED FOR SLEEP 90 tablet 1  . ALPRAZolam (XANAX) 1 MG tablet 1/2 twice daily and 1 tablet at night 180 tablet 1  . buPROPion (WELLBUTRIN XL) 300 MG 24 hr tablet TAKE 1 TABLET DAILY 90 tablet 3  . naltrexone (DEPADE) 50 MG tablet TAKE ONE-HALF (1/2) TABLET TWICE A DAY 90 tablet 3  . Pramipexole Dihydrochloride 3 MG TB24 Take 1 tablet (3 mg total) by mouth daily after lunch. 90 tablet 1  . rOPINIRole (REQUIP) 1 MG tablet TAKE 3 TABLETS AT BEDTIME 270 tablet 3  . sertraline (ZOLOFT) 100 MG tablet Take 1.5 tablets (150 mg total) by mouth daily. 45 tablet 0  . propranolol (INDERAL) 20 MG tablet TAKE 1 TABLET TWICE A DAY AS NEEDED 180 tablet 3   No current facility-administered medications for this visit.     Medication Side Effects: None  Allergies: No Known Allergies  Past Medical History:  Diagnosis Date  . Migraine 03/21/2014  . Panic attacks   . Restless legs syndrome 03/21/2014  . 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 Vertigo     Family History  Problem Relation Age of Onset  . Hypertension Mother   . Hypertension Father   . Tremor Sister   . Heart attack Neg Hx   . Diabetes Neg Hx      Social History   Socioeconomic History  . Marital status: Married    Spouse name: Not on file  . Number of children: 3  . Years of education: college  . Highest education level: Not on file  Occupational History  . Not on file  Social Needs  . Financial resource strain: Not on file  . Food insecurity    Worry: Not on file    Inability: Not on file  . Transportation needs    Medical: Not on file    Non-medical: Not on file  Tobacco Use  . Smoking status: Never Smoker  . Smokeless tobacco: Never Used  Substance and Sexual Activity  . Alcohol use: No  . Drug use: Never  . Sexual activity: Not on file  Lifestyle  . Physical activity    Days per week: Not on file    Minutes per session: Not on file  . Stress: Not on file  Relationships  . Social connections  Talks on phone: Not on file    Gets together: Not on file    Attends religious service: Not on file    Active member of club or organization: Not on file    Attends meetings of clubs or organizations: Not on file    Relationship status: Not on file  . Intimate partner violence    Fear of current or ex partner: Not on file    Emotionally abused: Not on file    Physically abused: Not on file    Forced sexual activity: Not on file  Other Topics Concern  . Not on file  Social History Narrative   Patient is right handed.   Patient drinks 1 cup caffeine daily.    Past Medical History, Surgical history, Social history, and Family history were reviewed and updated as appropriate.   Please see review of systems for further details on the patient's review from today.   Objective:   Physical Exam:  There were no vitals taken for this visit.  Physical Exam Neurological:     Mental Status: She is alert and oriented to person, place, and time.     Cranial Nerves: No dysarthria.  Psychiatric:        Attention and Perception: Attention normal. She does not perceive auditory hallucinations.        Mood and  Affect: Mood is anxious. Mood is not depressed.        Speech: Speech normal.        Behavior: Behavior is cooperative.        Thought Content: Thought content normal. Thought content is not paranoid or delusional. Thought content does not include homicidal or suicidal ideation. Thought content does not include homicidal or suicidal plan.        Cognition and Memory: Cognition and memory normal.        Judgment: Judgment normal.     Comments: Insight pretty good. She is chronically anxious it is never been completely controlled.  It is a little worse than usual situationally.     Lab Review:     Component Value Date/Time   NA 137 06/18/2018 1901   K 3.4 (L) 06/18/2018 1901   CL 104 06/18/2018 1901   CO2 24 06/18/2018 1901   GLUCOSE 107 (H) 06/18/2018 1901   BUN 16 06/18/2018 1901   CREATININE 0.96 06/18/2018 1901   CALCIUM 8.8 (L) 06/18/2018 1901   GFRNONAA >60 06/18/2018 1901   GFRAA >60 06/18/2018 1901       Component Value Date/Time   WBC 12.4 (H) 06/18/2018 1901   RBC 4.43 06/18/2018 1901   HGB 13.4 06/18/2018 1901   HCT 41.0 06/18/2018 1901   PLT 228 06/18/2018 1901   MCV 92.6 06/18/2018 1901   MCH 30.2 06/18/2018 1901   MCHC 32.7 06/18/2018 1901   RDW 13.4 06/18/2018 1901    No results found for: POCLITH, LITHIUM   No results found for: PHENYTOIN, PHENOBARB, VALPROATE, CBMZ   .res Assessment: Plan:    Generalized anxiety disorder  Insomnia due to mental condition  Panic disorder with agoraphobia  Binge-eating disorder, moderate  Restless legs syndrome  Tremor, essential   Supportive and problem focused therapy on trying to get son psych help.  Disc binge eating.  Consider counseling or support group for this but she is overwhelmed at the moment trying to deal with her son.  We will consider this after he is placed.  Her anxiety is less well managed with medications but  a lot of it has to do with external stressors including her son and COVID-19 and  her husband's leukemia.  Restless legs is under control with the medicines.  Tremor managed with the meds.  DT insomnia increase Xanax 0.5 mg BID and 1mg  HS.  Change to 1 mg Xanax DT QL.  We discussed the short-term risks associated with benzodiazepines including sedation and increased fall risk among others.  Discussed long-term side effect risk including dependence, potential withdrawal symptoms, and the potential eventual dose-related risk of dementia. Disc new studies refuting the link.  Re: RLS is still on unusual med combination for the following reason: Pramipexole only interfered with sleep. Ropinirole only was too fatiguing. So taking the mixture of the 2 for severe RLS Failed iron trials for RLS   Since the higher dosage of Wellbutrin did not help with weight will leave at 300mg  daily.  May retry higherdose once son is placed.  Consider phentermine.  No med changes indicated and she agrees.  .25 min appt.  FU 3 mos.  , MD, DFAPA   Please see After Visit Summary for patient specific instructions.  No future appointments.  No orders of the defined types were placed in this encounter.     -------------------------------

## 2018-12-01 ENCOUNTER — Other Ambulatory Visit: Payer: Self-pay | Admitting: Psychiatry

## 2018-12-17 ENCOUNTER — Telehealth: Payer: Self-pay | Admitting: Psychiatry

## 2018-12-17 NOTE — Telephone Encounter (Signed)
Patient called and said that she is having side effects to her xanax. She wants to know when she started taking the xanax and if xanax can cause nauseau after playing tennis or being active. Please give her a call at [phone]

## 2018-12-17 NOTE — Telephone Encounter (Signed)
Pt. Stated she is not exactly sure which med it could be. She only gets nauseous after playing tennis and it gets so bad she dry heaves and has to lay down. Could one of her other meds cause this after an activity like that?

## 2018-12-20 ENCOUNTER — Other Ambulatory Visit: Payer: Self-pay

## 2018-12-20 DIAGNOSIS — Z20822 Contact with and (suspected) exposure to covid-19: Secondary | ICD-10-CM

## 2018-12-21 LAB — NOVEL CORONAVIRUS, NAA: SARS-CoV-2, NAA: NOT DETECTED

## 2019-01-05 ENCOUNTER — Other Ambulatory Visit: Payer: Self-pay

## 2019-01-05 ENCOUNTER — Telehealth: Payer: Self-pay | Admitting: Psychiatry

## 2019-01-05 MED ORDER — ALPRAZOLAM 0.5 MG PO TABS: ORAL_TABLET | ORAL | 1 refills | Status: DC

## 2019-01-05 NOTE — Telephone Encounter (Signed)
Last refill 11/17/2018 #120 for 30 day supply. Will [facility]

## 2019-01-05 NOTE — Telephone Encounter (Signed)
Pt is requesting a refill of Xanax but does want it to go to [facility] [address].  Not using [facility]

## 2019-01-07 ENCOUNTER — Other Ambulatory Visit: Payer: Self-pay | Admitting: Psychiatry

## 2019-01-07 NOTE — Telephone Encounter (Signed)
Apologized for overlooking this message.  I have never heard of Xanax causing nausea.  It is used as an antinausea medicine for motion sickness.  The last med change was the addition of naltrexone.  She can stop that for a few days and see if it is related to the nausea.  There is nothing else she is taken that I would think would be causing the problem.

## 2019-02-01 ENCOUNTER — Other Ambulatory Visit: Payer: Self-pay | Admitting: Psychiatry

## 2019-02-16 ENCOUNTER — Encounter: Payer: Self-pay | Admitting: Psychiatry

## 2019-02-16 ENCOUNTER — Other Ambulatory Visit: Payer: Self-pay

## 2019-02-16 ENCOUNTER — Ambulatory Visit (INDEPENDENT_AMBULATORY_CARE_PROVIDER_SITE_OTHER): Payer: Managed Care, Other (non HMO) | Admitting: Psychiatry

## 2019-02-16 DIAGNOSIS — F4001 Agoraphobia with panic disorder: Secondary | ICD-10-CM | POA: Diagnosis not present

## 2019-02-16 DIAGNOSIS — F5081 Binge eating disorder: Secondary | ICD-10-CM

## 2019-02-16 DIAGNOSIS — F411 Generalized anxiety disorder: Secondary | ICD-10-CM

## 2019-02-16 DIAGNOSIS — F5105 Insomnia due to other mental disorder: Secondary | ICD-10-CM | POA: Diagnosis not present

## 2019-02-16 DIAGNOSIS — G250 Essential tremor: Secondary | ICD-10-CM

## 2019-02-16 DIAGNOSIS — G2581 Restless legs syndrome: Secondary | ICD-10-CM | POA: Diagnosis not present

## 2019-02-16 MED ORDER — GABAPENTIN 300 MG PO CAPS: ORAL_CAPSULE | ORAL | 1 refills | Status: DC

## 2019-02-16 NOTE — Progress Notes (Signed)
Pamela Little [id number] [birth year] 48 y.o.   Subjective:   Patient ID:  Pamela Little is a 49 y.o. (DOB August 03, 1970) female.  Chief Complaint:  Chief Complaint  Patient presents with  . Follow-up     Medciation Management  . Anxiety     Medciation Management  . Sleeping Problem  . Medication Problem    lightheaded after tennis    Anxiety Symptoms include nervous/anxious behavior. Patient reports no confusion or decreased concentration.    Medication Refill Associated symptoms include congestion. Pertinent negatives include no weakness.   Pamela Little presents to the office today for follow-up ofweight concerns and pharmacy problems and FU anxiety.  When seen May 2020.  She was having insomnia as we increased the Xanax to 0.5 mg twice daily and 1 mg nightly.  She continued Wellbutrin XL 300 mg every morning, sertraline 150, ropinirole for restless legs, propranolol as needed for anxiety, naltrexone for weight gain. Also taking pramipexole ER.    Last seen September.  Following change: DT insomnia increase Xanax 0.5 mg BID and  HS.  Hanging in there.  Quit taking Naltrexone and pramipexole ER  .  After tennis would feel unusually fatigued and like she might pass out.  More RLS in the afternoon without the pramipexole   Has been told she's in menopause and is having sweating and hot flashes.  Sleep comes and goes and is not consistent.   Overall mentally OK for the most part.  Can be chronically tired.  Always upset with her weight.  Working normal 40 + hours.  Chronically stressed.  Has been a little anxious and been stressed with H sick with leukemia.  Son 70 yo still driving them nuts and not doing school.  Still struggling big time with him.  That will make her weepy.      Definitely anxious.  Struggles with weight.  Plans to see GYN   Trouble staying asleep and 5 hours average, not enough.  Some awakening with anxiety and worrying over son being out and whether  he's safe.  Typical Xanax 0.5mg  TID  Took Wellb 450 am for awhile and did not lose weight or see any benefit or SE.  Emotional eater and stressed by son's mental health and substance abuse problems and refusing school.  Very angry and defiant.  Anticipates placement somewhere.  Drinking and pot.  Chronically anxious.  Hard on the whole family.  Refusing church.  Pt doesn't think changing her med will help.  I keep eating.  Not ususal for her.  Not dep but upset with her son.  No severe panic attacks.  Best very frustrated with her binge eating at this time but feels the stress at home is precipitating it.  Switched Ativan to Xanax and been OK with that.  Not sure re: differences except sleeps better ususally.  RLS controlled and the pramipexole ER at noon helps.   Tremor manageable.  Caffeine 1 diet Coke or less.    Stress H with Leukemia and recent hospitalization for heart problems.  Past Psychiatric Medication Trials:  Fluoxetine, Wellbutrin,  venlafaxine, sertraline 300, Lexapro 30, fluvoxamine, buspirone, clonazepam, lorazepam, Xanax Abilify, Vistaril, propranolol, Mirapex, naltrexone,  topiramate 200 fatigue, Pramipexole only interfered with sleep. Ropinirole only was too fatiguing. So taking the mixture of the 2 for severe RLS Failed iron trials for RLS Naltrexone excessive weakness off it.  Review of Systems:  Review of Systems  HENT: Positive for congestion.   Neurological: Negative  for tremors and weakness.  Psychiatric/Behavioral: Negative for agitation, behavioral problems, confusion, decreased concentration, dysphoric mood, hallucinations, self-injury and sleep disturbance. The patient is nervous/anxious. The patient is not hyperactive.     Medications: I have reviewed the patient's current medications.  Current Outpatient Medications  Medication Sig Dispense Refill  . ADVAIR DISKUS 250-50 MCG/DOSE AEPB TAKE 1 PUFF BY MOUTH TWICE A DAY    . albuterol (VENTOLIN HFA) 108  (90 Base) MCG/ACT inhaler TAKE 2 PUFFS BY MOUTH EVERY 4 HOURS AS NEEDED    . ALPRAZolam (XANAX) 0.5 MG tablet TAKE 1 TABLET BY MOUTH TWICE A DAY AND 2 TABS AT BEDTIME AS NEEDED FOR SLEEP 120 tablet 1  . buPROPion (WELLBUTRIN XL) 300 MG 24 hr tablet TAKE 1 TABLET BY MOUTH EVERY DAY 30 tablet 1  . rOPINIRole (REQUIP) 1 MG tablet TAKE 3 TABLETS AT BEDTIME 270 tablet 3  . sertraline (ZOLOFT) 100 MG tablet TAKE 1 AND 1/2 TABLET BY MOUTH EVERY DAY 45 tablet 5  . gabapentin (NEURONTIN) 300 MG capsule 1 at lunch and dinner for 5 days then 1 at lunch and dinner 120 capsule 1  . naltrexone (DEPADE) 50 MG tablet TAKE ONE-HALF (1/2) TABLET TWICE A DAY (Patient not taking: Reported on 02/16/2019) 90 tablet 3  . Pramipexole Dihydrochloride 3 MG TB24 TAKE 1 TABLET BY MOUTH EVERY DAY AFTER LUNCH (Patient not taking: Reported on 02/16/2019) 30 tablet 1  . propranolol (INDERAL) 20 MG tablet TAKE 1 TABLET TWICE A DAY AS NEEDED 180 tablet 3   No current facility-administered medications for this visit.    Medication Side Effects: None  Allergies: No Known Allergies  Past Medical History:  Diagnosis Date  . Migraine 03/21/2014  . Panic attacks   . Restless legs syndrome 03/21/2014  . 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 Vertigo     Family History  Problem Relation Age of Onset  . Hypertension Mother   . Hypertension Father   . Tremor Sister   . Heart attack Neg Hx   . Diabetes Neg Hx     Social History   Socioeconomic History  . Marital status: Married    Spouse name: Not on file  . Number of children: 3  . Years of education: college  . Highest education level: Not on file  Occupational History  . Not on file  Tobacco Use  . Smoking status: Never Smoker  . Smokeless tobacco: Never Used  Substance and Sexual Activity  . Alcohol use: No  . Drug use: Never  . Sexual activity: Not on file  Other Topics Concern  . Not on file  Social History Narrative   Patient is right handed.   Patient drinks 1  cup caffeine daily.   Social Determinants of Health   Financial Resource Strain:   . Difficulty of Paying Living Expenses: Not on file  Food Insecurity:   . Worried About Programme researcher, broadcasting/film/video in the Last Year: Not on file  . Ran Out of Food in the Last Year: Not on file  Transportation Needs:   . Lack of Transportation (Medical): Not on file  . Lack of Transportation (Non-Medical): Not on file  Physical Activity:   . Days of Exercise per Week: Not on file  . Minutes of Exercise per Session: Not on file  Stress:   . Feeling of Stress : Not on file  Social Connections:   . Frequency of Communication with Friends and Family: Not on file  . Frequency of  Social Gatherings with Friends and Family: Not on file  . Attends Religious Services: Not on file  . Active Member of Clubs or Organizations: Not on file  . Attends Archivist Meetings: Not on file  . Marital Status: Not on file  Intimate Partner Violence:   . Fear of Current or Ex-Partner: Not on file  . Emotionally Abused: Not on file  . Physically Abused: Not on file  . Sexually Abused: Not on file    Past Medical History, Surgical history, Social history, and Family history were reviewed and updated as appropriate.   Please see review of systems for further details on the patient's review from today.   Objective:   Physical Exam:  There were no vitals taken for this visit.  Physical Exam Neurological:     Mental Status: She is alert and oriented to person, place, and time.     Cranial Nerves: No dysarthria.  Psychiatric:        Attention and Perception: Attention normal. She does not perceive auditory hallucinations.        Mood and Affect: Mood is anxious. Mood is not depressed.        Speech: Speech normal.        Behavior: Behavior is cooperative.        Thought Content: Thought content normal. Thought content is not paranoid or delusional. Thought content does not include homicidal or suicidal ideation.  Thought content does not include homicidal or suicidal plan.        Cognition and Memory: Cognition and memory normal.        Judgment: Judgment normal.     Comments: Insight pretty good. She is chronically anxious it is never been completely controlled.  It is a little worse than usual situationally.     Lab Review:     Component Value Date/Time   NA 137 06/18/2018 1901   K 3.4 (L) 06/18/2018 1901   CL 104 06/18/2018 1901   CO2 24 06/18/2018 1901   GLUCOSE 107 (H) 06/18/2018 1901   BUN 16 06/18/2018 1901   CREATININE 0.96 06/18/2018 1901   CALCIUM 8.8 (L) 06/18/2018 1901   GFRNONAA >60 06/18/2018 1901   GFRAA >60 06/18/2018 1901       Component Value Date/Time   WBC 12.4 (H) 06/18/2018 1901   RBC 4.43 06/18/2018 1901   HGB 13.4 06/18/2018 1901   HCT 41.0 06/18/2018 1901   PLT 228 06/18/2018 1901   MCV 92.6 06/18/2018 1901   MCH 30.2 06/18/2018 1901   MCHC 32.7 06/18/2018 1901   RDW 13.4 06/18/2018 1901    No results found for: POCLITH, LITHIUM   No results found for: PHENYTOIN, PHENOBARB, VALPROATE, CBMZ   .res Assessment: Plan:    Generalized anxiety disorder  Insomnia due to mental condition  Restless legs syndrome - Plan: gabapentin (NEURONTIN) 300 MG capsule  Panic disorder with agoraphobia  Binge-eating disorder, moderate  Tremor, essential   Supportive and problem focused therapy on trying to get son psych help.  Disc binge eating.  Consider counseling or support group for this but she is overwhelmed at the moment trying to deal with her son.  We will consider this after he is placed.  Her anxiety is less well managed with medications but a lot of it has to do with external stressors including her son and COVID-19 and her husband's leukemia.  Restless legs is under control with the medicines.  Tremor managed with the meds.  Change to 1 mg Xanax DT QL.  We discussed the short-term risks associated with benzodiazepines including sedation and  increased fall risk among others.  Discussed long-term side effect risk including dependence, potential withdrawal symptoms, and the potential eventual dose-related risk of dementia. Disc new studies refuting the link.  Re: RLS is still on unusual med combination for the following reason: Pramipexole only interfered with sleep. Ropinirole only was too fatiguing. So taking the mixture of the 2 for severe RLS Failed iron trials for RLS  Trial gabapentin 300 mg 2 twice daily for RLS.   Since the higher dosage of Wellbutrin did not help with weight will leave at 300mg  daily.  May retry higherdose once son is placed.  Consider phentermine.  Push fluids.  Doesn't drink enough water.  No med changes indicated and she agrees.  .25 min appt.  FU 3 mos.  Meredith Staggersarey Cottle, MD, DFAPA   Please see After Visit Summary for patient specific instructions.  Future Appointments  Date Time Provider Department Center  05/18/2019  4:30 PM Cottle, Steva Readyarey G Jr., MD [facility] None    No orders of the defined types were placed in this encounter.     -------------------------------

## 2019-02-27 ENCOUNTER — Other Ambulatory Visit: Payer: Self-pay | Admitting: Psychiatry

## 2019-03-01 NOTE — Telephone Encounter (Signed)
Is the directions suppose to be 2 at hs or just 1 at hs?  Last refill #90 on 12/19

## 2019-03-31 ENCOUNTER — Other Ambulatory Visit: Payer: Self-pay | Admitting: Psychiatry

## 2019-05-04 ENCOUNTER — Ambulatory Visit
Admission: RE | Admit: 2019-05-04 | Discharge: 2019-05-04 | Disposition: A | Discharge status: Home / Self Care | Payer: 59 | Source: Ambulatory Visit | Attending: Family Medicine | Admitting: Family Medicine

## 2019-05-04 ENCOUNTER — Other Ambulatory Visit: Payer: Self-pay

## 2019-05-04 ENCOUNTER — Other Ambulatory Visit: Payer: Self-pay | Admitting: Family Medicine

## 2019-05-04 DIAGNOSIS — Z1231 Encounter for screening mammogram for malignant neoplasm of breast: Secondary | ICD-10-CM

## 2019-05-18 ENCOUNTER — Other Ambulatory Visit: Payer: Self-pay

## 2019-05-18 ENCOUNTER — Ambulatory Visit (INDEPENDENT_AMBULATORY_CARE_PROVIDER_SITE_OTHER): Payer: 59 | Admitting: Psychiatry

## 2019-05-18 ENCOUNTER — Encounter: Payer: Self-pay | Admitting: Psychiatry

## 2019-05-18 DIAGNOSIS — G2581 Restless legs syndrome: Secondary | ICD-10-CM

## 2019-05-18 DIAGNOSIS — F411 Generalized anxiety disorder: Secondary | ICD-10-CM | POA: Diagnosis not present

## 2019-05-18 DIAGNOSIS — F4001 Agoraphobia with panic disorder: Secondary | ICD-10-CM

## 2019-05-18 DIAGNOSIS — F5105 Insomnia due to other mental disorder: Secondary | ICD-10-CM

## 2019-05-18 DIAGNOSIS — F5081 Binge eating disorder: Secondary | ICD-10-CM

## 2019-05-18 MED ORDER — GABAPENTIN 300 MG PO CAPS: 600.0000 mg | ORAL_CAPSULE | Freq: Three times a day (TID) | ORAL | 1 refills | Status: DC

## 2019-05-18 NOTE — Progress Notes (Addendum)
Pamela Little [id number] August 05, 1970 49 y.o.   Subjective:   Patient ID:  Pamela Little is a 49 y.o. (DOB 08/27/1970) female.  Chief Complaint:  Chief Complaint  Patient presents with  . Sleeping Problem  . Anxiety  . Medication Problem    Anxiety Symptoms include nervous/anxious behavior. Patient reports no confusion or decreased concentration.    Medication Refill Pertinent negatives include no congestion or weakness.   [name] presents to the office today for follow-up ofweight concerns and pharmacy problems and FU anxiety.  When seen May 2020.  She was having insomnia as we increased the Xanax to 0.5 mg twice daily and 1 mg nightly.  She continued Wellbutrin XL 300 mg every morning, sertraline 150, ropinirole for restless legs, propranolol as needed for anxiety, naltrexone for weight gain. Also taking pramipexole ER.     seen September.  Following change: DT insomnia increase Xanax 0.5 mg BID and 1mg  HS.  Last seen February 16, 2019.  The following was noted: Hanging in there.  Quit taking Naltrexone and pramipexole ER 3mg  .  After tennis would feel unusually fatigued and like she might pass out. More RLS in the afternoon without the pramipexole  The following medical decisions were made: Re: RLS is still on unusual med combination for the following reason: Pramipexole only interfered with sleep.  So stopped. Ropinirole only was too fatiguing. So taking the mixture of the 2 for severe RLS Failed iron trials for RLS Trial gabapentin 300 mg 2 twice daily for RLS.  She does not feel the gabapentin does much so still taking the ropinirole. No SE.   Milder RLS.    Since the higher dosage of Wellbutrin did not help with weight will leave at 300mg  daily.  May retry higherdose once son is placed.  Consider phentermine.  Has been told she's in menopause and is having sweating and hot flashes.  Sleep comes and goes and is not consistent.   Overall mentally OK for the  most part.  Definitely anxious.  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  Not depressed.   Can be chronically tired.  Always upset with her weight.  Working normal 40 + hours.  Chronically stressed.  Has been a little anxious and been stressed with H sick with leukemia.  Son 96 yo still driving them nuts and not doing school.  Still struggling big time with him.  That will make her weepy.      Definitely anxious.  Struggles with weight.  Plans to see GYN   Trouble staying asleep and 5 hours average, not enough.  Some awakening with anxiety and worrying over son being out and whether he's safe.  Typical Xanax 0.5mg  TID  Took Wellb 450 am for awhile and did not lose weight or see any benefit or SE.  Emotional eater and stressed by son's mental health and substance abuse problems and refusing school.  Very angry and defiant.  Anticipates placement somewhere.  Drinking and pot.  Chronically anxious.  Hard on the whole family.  Refusing church.  Pt doesn't think changing her med will help.  I keep eating.  Not ususal for her.  Not dep but upset with her son.  No severe panic attacks.  Best very frustrated with her binge eating at this time but feels the stress at home is precipitating it.  Switched Ativan to Xanax and been OK with that.  Not sure re: differences except sleeps better ususally.  Tremor manageable.  Caffeine 1 diet Coke or less.    Stress H with Leukemia and recent hospitalization for heart problems.  Past Psychiatric Medication Trials:  Fluoxetine, Wellbutrin,  venlafaxine, sertraline 300, Lexapro 30, fluvoxamine, buspirone,  clonazepam, lorazepam, Xanax Abilify, Vistaril, propranolol, Mirapex, naltrexone,  topiramate 200 fatigue, Pramipexole only interfered with sleep. Pramipexole ER Ropinirole only was too fatiguing. So taking the mixture of the 2 for severe RLS Failed iron trials for RLS Gabapentin 600 Naltrexone excessive weakness off it.  Review of Systems:   Review of Systems  HENT: Negative for congestion.   Neurological: Negative for tremors and weakness.  Psychiatric/Behavioral: Negative for agitation, behavioral problems, confusion, decreased concentration, dysphoric mood, hallucinations, self-injury and sleep disturbance. The patient is nervous/anxious. The patient is not hyperactive.     Medications: I have reviewed the patient's current medications.  Current Outpatient Medications  Medication Sig Dispense Refill  . ADVAIR DISKUS 250-50 MCG/DOSE AEPB TAKE 1 PUFF BY MOUTH TWICE A DAY    . albuterol (VENTOLIN HFA) 108 (90 Base) MCG/ACT inhaler TAKE 2 PUFFS BY MOUTH EVERY 4 HOURS AS NEEDED    . ALPRAZolam (XANAX) 0.5 MG tablet TAKE 1 TABLET BY MOUTH TWICE A DAY AND TAKE 2 TABLETS AT BEDTIME AS NEEDED SLEEP 90 tablet 2  . buPROPion (WELLBUTRIN XL) 300 MG 24 hr tablet TAKE 1 TABLET BY MOUTH EVERY DAY 30 tablet 1  . gabapentin (NEURONTIN) 300 MG capsule Take 2 capsules (600 mg total) by mouth 3 (three) times daily. 1 at lunch and dinner for 5 days then 1 at lunch and dinner 180 capsule 1  . rOPINIRole (REQUIP) 1 MG tablet TAKE 3 TABLETS AT BEDTIME (Patient taking differently: Take 2 mg by mouth daily. ) 270 tablet 3  . sertraline (ZOLOFT) 100 MG tablet TAKE 1 AND 1/2 TABLET BY MOUTH EVERY DAY 45 tablet 5  . propranolol (INDERAL) 20 MG tablet TAKE 1 TABLET TWICE A DAY AS NEEDED 180 tablet 3   No current facility-administered medications for this visit.    Medication Side Effects: None  Allergies: No Known Allergies  Past Medical History:  Diagnosis Date  . Migraine 03/21/2014  . Panic attacks   . Restless legs syndrome 03/21/2014  . 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 Vertigo     Family History  Problem Relation Age of Onset  . Hypertension Mother   . Hypertension Father   . Tremor Sister   . Heart attack Neg Hx   . Diabetes Neg Hx     Social History   Socioeconomic History  . Marital status: Married    Spouse name: Not on file  .  Number of children: 3  . Years of education: college  . Highest education level: Not on file  Occupational History  . Not on file  Tobacco Use  . Smoking status: Never Smoker  . Smokeless tobacco: Never Used  Substance and Sexual Activity  . Alcohol use: No  . Drug use: Never  . Sexual activity: Not on file  Other Topics Concern  . Not on file  Social History Narrative   Patient is right handed.   Patient drinks 1 cup caffeine daily.   Social Determinants of Health   Financial Resource Strain:   . Difficulty of Paying Living Expenses:   Food Insecurity:   . Worried About Programme researcher, broadcasting/film/video in the Last Year:   . [employer] of Food in the Last Year:   Transportation Needs:   . Lack of  Transportation (Medical):   Marland Kitchen Lack of Transportation (Non-Medical):   Physical Activity:   . Days of Exercise per Week:   . Minutes of Exercise per Session:   Stress:   . Feeling of Stress :   Social Connections:   . Frequency of Communication with Friends and Family:   . Frequency of Social Gatherings with Friends and Family:   . Attends Religious Services:   . Active Member of Clubs or Organizations:   . Attends Banker Meetings:   Marland Kitchen Marital Status:   Intimate Partner Violence:   . Fear of Current or Ex-Partner:   . Emotionally Abused:   Marland Kitchen Physically Abused:   . Sexually Abused:     Past Medical History, Surgical history, Social history, and Family history were reviewed and updated as appropriate.   Please see review of systems for further details on the patient's review from today.   Objective:   Physical Exam:  There were no vitals taken for this visit.  Physical Exam Constitutional:      General: She is not in acute distress. Musculoskeletal:        General: No deformity.  Neurological:     Mental Status: She is alert and oriented to person, place, and time.     Cranial Nerves: No dysarthria.     Coordination: Coordination normal.  Psychiatric:         Attention and Perception: Attention and perception normal. She does not perceive auditory or visual hallucinations.        Mood and Affect: Mood is anxious. Mood is not depressed. Affect is not labile, blunt, angry or inappropriate.        Speech: Speech normal.        Behavior: Behavior normal. Behavior is cooperative.        Thought Content: Thought content normal. Thought content is not paranoid or delusional. Thought content does not include homicidal or suicidal ideation. Thought content does not include homicidal or suicidal plan.        Cognition and Memory: Cognition and memory normal.        Judgment: Judgment normal.     Comments: Insight pretty good. She is chronically anxious it is never been completely controlled.       Lab Review:     Component Value Date/Time   NA 137 06/18/2018 1901   K 3.4 (L) 06/18/2018 1901   CL 104 06/18/2018 1901   CO2 24 06/18/2018 1901   GLUCOSE 107 (H) 06/18/2018 1901   BUN 16 06/18/2018 1901   CREATININE 0.96 06/18/2018 1901   CALCIUM 8.8 (L) 06/18/2018 1901   GFRNONAA >60 06/18/2018 1901   GFRAA >60 06/18/2018 1901       Component Value Date/Time   WBC 12.4 (H) 06/18/2018 1901   RBC 4.43 06/18/2018 1901   HGB 13.4 06/18/2018 1901   HCT 41.0 06/18/2018 1901   PLT 228 06/18/2018 1901   MCV 92.6 06/18/2018 1901   MCH 30.2 06/18/2018 1901   MCHC 32.7 06/18/2018 1901   RDW 13.4 06/18/2018 1901    No results found for: POCLITH, LITHIUM   No results found for: PHENYTOIN, PHENOBARB, VALPROATE, CBMZ   .res Assessment: Plan:    Generalized anxiety disorder  Panic disorder with agoraphobia  Restless legs syndrome - Plan: gabapentin (NEURONTIN) 300 MG capsule  Binge-eating disorder, moderate  Insomnia due to mental condition   Supportive and problem focused therapy on trying to get son psych help.  Disc binge eating.  Consider counseling or support group for this but she is overwhelmed at the moment trying to deal with her son.   We will consider this after he is placed.  Her anxiety is less well managed with medications but a lot of it has to do with external stressors including her son and COVID-19 and her husband's leukemia.  Restless legs is under control with the medicines.  Tremor managed with the meds.    Change to 1 mg Xanax DT QL.  We discussed the short-term risks associated with benzodiazepines including sedation and increased fall risk among others.  Discussed long-term side effect risk including dependence, potential withdrawal symptoms, and the potential eventual dose-related risk of dementia. Disc new studies refuting the link.  Re: RLS is still on unusual med combination for the following reason: Pramipexole only interfered with sleep. So stopped. Ropinirole only was too fatiguing.  Failed iron trials for RLS  Trial higher gabapentin 600 mg 2 twice daily for RLS.  Might also help anxiety.  Disc Ozempic with PCP for weight loss.  Gave a copy of recent study.  Disc questions about Covid vaccine.   Since the higher dosage of Wellbutrin did not help with weight will leave at 300mg  daily.  May retry higherdose once son is placed.  Consider phentermine.  Push fluids.  Doesn't drink enough water.  No med changes indicated and she agrees.  .25 min appt.  FU 3 mos.  Lynder Parents, MD, DFAPA   Please see After Visit Summary for patient specific instructions.  Future Appointments  Date Time Provider [place]  06/07/2019  2:30 PM Mauri Pole, MD [facility]    No orders of the defined types were placed in this encounter.     -------------------------------

## 2019-05-19 ENCOUNTER — Other Ambulatory Visit: Payer: Self-pay

## 2019-05-19 DIAGNOSIS — G2581 Restless legs syndrome: Secondary | ICD-10-CM

## 2019-05-19 MED ORDER — GABAPENTIN 300 MG PO CAPS: 600.0000 mg | ORAL_CAPSULE | Freq: Three times a day (TID) | ORAL | 1 refills | Status: DC

## 2019-06-01 ENCOUNTER — Other Ambulatory Visit: Payer: Self-pay | Admitting: Psychiatry

## 2019-06-02 ENCOUNTER — Other Ambulatory Visit: Payer: Self-pay | Admitting: Psychiatry

## 2019-06-06 ENCOUNTER — Telehealth: Payer: Self-pay | Admitting: Psychiatry

## 2019-06-06 ENCOUNTER — Other Ambulatory Visit: Payer: Self-pay

## 2019-06-06 DIAGNOSIS — G2581 Restless legs syndrome: Secondary | ICD-10-CM

## 2019-06-06 MED ORDER — GABAPENTIN 300 MG PO CAPS: 600.0000 mg | ORAL_CAPSULE | Freq: Two times a day (BID) | ORAL | 1 refills | Status: DC

## 2019-06-06 NOTE — Telephone Encounter (Signed)
Pharmacist had a Rx of Gabapentin for her but it was an old Rx. He canceled that out and filled Gabapentin 300 mg take 2 bid. Her insurance only allows #90 at a time, he said we could try a PA but wasn't sure it would work because it didn't say that. Advised him to give her a couple weeks work until we can submit a PA. He agreed and will have for her shortly.

## 2019-06-06 NOTE — Telephone Encounter (Signed)
Pamela Little called because she has not been able to fill the new prescription of her gabapentin.  The increase in dose seems to have caused some problem at the pharmacy but she is unsure what the problem is.  She has not be able to increase the dose yet and is now out of the prescription as it was prescribed before.  The pharmacy is [facility] [address]  I don't see where a PA has been requested. Anyway she needs a refill of some kind.

## 2019-06-07 ENCOUNTER — Encounter: Payer: Self-pay | Admitting: Gastroenterology

## 2019-06-07 ENCOUNTER — Ambulatory Visit: Payer: Self-pay | Admitting: Gastroenterology

## 2019-06-07 NOTE — Telephone Encounter (Signed)
Called pharmacist this morning and he said no PA is needed, she picked up #120 Gabapentin 300 mg bid yesterday with a $5 copay. Not sure what the issue was yesterday but she has her medication now.

## 2019-06-23 ENCOUNTER — Telehealth: Payer: Self-pay | Admitting: *Deleted

## 2019-06-23 NOTE — Telephone Encounter (Signed)
Pt no showed 8 am PV- Called pt- LM to RC by 5 pm today to RS or her PV and Colon would be canceled No CB to RS = Canceled PV and Colon Mailed NS letter  Hilda Lias PV

## 2019-07-07 ENCOUNTER — Encounter: Payer: 59 | Admitting: Gastroenterology

## 2019-07-12 ENCOUNTER — Other Ambulatory Visit: Payer: Self-pay | Admitting: Psychiatry

## 2019-07-15 ENCOUNTER — Other Ambulatory Visit: Payer: Self-pay | Admitting: Psychiatry

## 2019-07-29 ENCOUNTER — Other Ambulatory Visit: Payer: Self-pay | Admitting: Psychiatry

## 2019-07-29 NOTE — Telephone Encounter (Signed)
Change to 2 mg or leave as 3 mg?

## 2019-08-15 ENCOUNTER — Telehealth: Payer: Self-pay | Admitting: Psychiatry

## 2019-08-15 NOTE — Telephone Encounter (Signed)
LM for her to call back with her symptoms

## 2019-08-15 NOTE — Telephone Encounter (Signed)
Pt called back with symptoms can't sleep legs antsy and start cramping in calf.

## 2019-08-15 NOTE — Telephone Encounter (Signed)
Put her on cancellation list. °

## 2019-08-15 NOTE — Telephone Encounter (Signed)
I have asked the admin staff to put her on cancellation list from like to have her appointment moved up.  We have tried multiple meds and combinations to manage her restless legs and most recently increase gabapentin to 600 mg twice daily in addition to the ropinirole 3 mg nightly and benzodiazepine at night.  In the short-term she can do 1 of 2 things she can either increase the gabapentin by 1 or 2 additional 300 mg capsules daily.  Gabapentin can be prescribed at much higher doses if it is needed and tolerated with the main risk being tiredness. The other option is increasing her benzodiazepine by 1 additional tablet at bedtime.  Either option is reasonable.  I know we have tried long-acting pramipexole, we could consider long-acting ropinirole although it is probably less likely to help her get to sleep than the other 2 options.

## 2019-08-15 NOTE — Telephone Encounter (Signed)
Pt called stated not doing well. meds for restless leg and other meds not working. Return call @ [phone]. Apt 7/28

## 2019-08-16 NOTE — Telephone Encounter (Signed)
LM to call back with recommendations.

## 2019-09-14 ENCOUNTER — Ambulatory Visit: Payer: 59 | Admitting: Psychiatry

## 2019-09-17 ENCOUNTER — Other Ambulatory Visit: Payer: Self-pay | Admitting: Psychiatry

## 2019-09-21 ENCOUNTER — Encounter: Payer: Self-pay | Admitting: Gastroenterology

## 2019-09-21 ENCOUNTER — Other Ambulatory Visit: Payer: Self-pay

## 2019-09-21 ENCOUNTER — Ambulatory Visit (AMBULATORY_SURGERY_CENTER): Payer: Self-pay | Admitting: *Deleted

## 2019-09-21 VITALS — Ht 68.0 in | Wt 220.0 lb

## 2019-09-21 DIAGNOSIS — Z1211 Encounter for screening for malignant neoplasm of colon: Secondary | ICD-10-CM

## 2019-09-21 MED ORDER — SUTAB 1479-225-188 MG PO TABS: 1.0000 | ORAL_TABLET | Freq: Once | ORAL | 0 refills | Status: AC

## 2019-09-21 NOTE — Progress Notes (Signed)
No egg or soy allergy known to patient  No issues with past sedation with any surgeries or procedures no intubation problems in the past  No FH of Malignant Hyperthermia No diet pills per patient No home 02 use per patient  No blood thinners per patient  Pt denies issues with constipation  No A fib or A flutter  EMMI video to pt or via MyChart  COVID 19 guidelines implemented in PV today with Pt and RN   Sutab Coupon given to pt in PV today , Code to Pharmacy   Due to the COVID-19 pandemic we are asking patients to follow these guidelines. Please only bring one care partner. Please be aware that your care partner may wait in the car in the parking lot or if they feel like they will be too hot to wait in the car, they may wait in the lobby on the 4th floor. All care partners are required to wear a mask the entire time (we do not have any that we can provide them), they need to practice social distancing, and we will do a Covid check for all patient's and care partners when you arrive. Also we will check their temperature and your temperature. If the care partner waits in their car they need to stay in the parking lot the entire time and we will call them on their cell phone when the patient is ready for discharge so they can bring the car to the front of the building. Also all patient's will need to wear a mask into building.  

## 2019-10-03 ENCOUNTER — Encounter: Payer: Self-pay | Admitting: Gastroenterology

## 2019-10-03 ENCOUNTER — Ambulatory Visit (AMBULATORY_SURGERY_CENTER): Payer: 59 | Admitting: Gastroenterology

## 2019-10-03 ENCOUNTER — Other Ambulatory Visit: Payer: Self-pay

## 2019-10-03 VITALS — BP 89/45 | HR 61 | Temp 97.9°F | Resp 9 | Ht 68.0 in | Wt 220.0 lb

## 2019-10-03 DIAGNOSIS — Z1211 Encounter for screening for malignant neoplasm of colon: Secondary | ICD-10-CM | POA: Diagnosis present

## 2019-10-03 MED ORDER — SODIUM CHLORIDE 0.9 % IV SOLN
500.0000 mL | Freq: Once | INTRAVENOUS | Status: DC
Start: 2019-10-03 — End: 2019-10-03

## 2019-10-03 NOTE — Progress Notes (Signed)
Pt. Reports no change in her medical or surgical history since her pre-visit 09/21/2019.

## 2019-10-03 NOTE — Progress Notes (Signed)
Report to PACU, RN, vss, BBS= Clear.  

## 2019-10-03 NOTE — Op Note (Signed)
[facility] Patient Name: Pamela Little Procedure Date: 10/03/2019 9:26 AM MRN: [id number] Endoscopist: Napoleon Form , MD Age: 49 Referring MD:  Date of Birth: 24-Jun-1970 Gender: Female Account #: [id number] Procedure:                Colonoscopy Indications:              Screening for colorectal malignant neoplasm Medicines:                Monitored Anesthesia Care Procedure:                Pre-Anesthesia Assessment:                           - Prior to the procedure, a History and Physical                            was performed, and patient medications and                            allergies were reviewed. The patient's tolerance of                            previous anesthesia was also reviewed. The risks                            and benefits of the procedure and the sedation                            options and risks were discussed with the patient.                            All questions were answered, and informed consent                            was obtained. Prior Anticoagulants: The patient has                            taken no previous anticoagulant or antiplatelet                            agents. ASA Grade Assessment: II - A patient with                            mild systemic disease. After reviewing the risks                            and benefits, the patient was deemed in                            satisfactory condition to undergo the procedure.                           After obtaining informed consent, the colonoscope  was passed under direct vision. Throughout the                            procedure, the patient's blood pressure, pulse, and                            oxygen saturations were monitored continuously. The                            Colonoscope was introduced through the anus and                            advanced to the the cecum, identified by                            appendiceal orifice and  ileocecal valve. The                            colonoscopy was performed without difficulty. The                            patient tolerated the procedure well. The quality                            of the bowel preparation was good. The ileocecal                            valve, appendiceal orifice, and rectum were                            photographed. Scope In: 9:34:41 AM Scope Out: 9:46:20 AM Scope Withdrawal Time: 0 hours 7 minutes 50 seconds  Total Procedure Duration: 0 hours 11 minutes 39 seconds  Findings:                 The perianal and digital rectal examinations were                            normal.                           A few small-mouthed diverticula were found in the                            sigmoid colon.                           Non-bleeding internal hemorrhoids were found during                            retroflexion. The hemorrhoids were small. Complications:            No immediate complications. Estimated Blood Loss:     Estimated blood loss was minimal. Impression:               - Diverticulosis in the sigmoid colon.                           -  Non-bleeding internal hemorrhoids.                           - No specimens collected. Recommendation:           - Patient has a contact number available for                            emergencies. The signs and symptoms of potential                            delayed complications were discussed with the                            patient. Return to normal activities tomorrow.                            Written discharge instructions were provided to the                            patient.                           - Continue present medications.                           - Repeat colonoscopy in 10 years for screening                            purposes. Napoleon Form, MD 10/03/2019 9:50:50 AM This report has been signed electronically.

## 2019-10-03 NOTE — Patient Instructions (Signed)
HANDOUTS PROVIDED ON: DIVERTICULOSIS & HEMORRHOIDS  Your next colonoscopy should occur in 10 years.  You may resume your previous diet and medication schedule.  Thank you for allowing [place] to care for you today!!!   YOU HAD AN ENDOSCOPIC PROCEDURE TODAY AT THE [facility]:   Refer to the procedure report that was given to you for any specific questions about what was found during the examination.  If the procedure report does not answer your questions, please call your gastroenterologist to clarify.  If you requested that your care partner not be given the details of your procedure findings, then the procedure report has been included in a sealed envelope for you to review at your convenience later.  YOU SHOULD EXPECT: Some feelings of bloating in the abdomen. Passage of more gas than usual.  Walking can help get rid of the air that was put into your GI tract during the procedure and reduce the bloating. If you had a lower endoscopy (such as a colonoscopy or flexible sigmoidoscopy) you may notice spotting of blood in your stool or on the toilet paper. If you underwent a bowel prep for your procedure, you may not have a normal bowel movement for a few days.  Please Note:  You might notice some irritation and congestion in your nose or some drainage.  This is from the oxygen used during your procedure.  There is no need for concern and it should clear up in a day or so.  SYMPTOMS TO REPORT IMMEDIATELY:   Following lower endoscopy (colonoscopy or flexible sigmoidoscopy):  Excessive amounts of blood in the stool  Significant tenderness or worsening of abdominal pains  Swelling of the abdomen that is new, acute  Fever of 100F or higher  For urgent or emergent issues, a gastroenterologist can be reached at any hour by calling [phone]. Do not use MyChart messaging for urgent concerns.    DIET:  We do recommend a small meal at first, but then you may proceed to your regular  diet.  Drink plenty of fluids but you should avoid alcoholic beverages for 24 hours.  ACTIVITY:  You should plan to take it easy for the rest of today and you should NOT DRIVE or use heavy machinery until tomorrow (because of the sedation medicines used during the test).    FOLLOW UP: Our staff will call the number listed on your records 48-72 hours following your procedure to check on you and address any questions or concerns that you may have regarding the information given to you following your procedure. If we do not reach you, we will leave a message.  We will attempt to reach you two times.  During this call, we will ask if you have developed any symptoms of COVID 19. If you develop any symptoms (ie: fever, flu-like symptoms, shortness of breath, cough etc.) before then, please call [phone].  If you test positive for Covid 19 in the 2 weeks post procedure, please call and report this information to [place].    If any biopsies were taken you will be contacted by phone or by letter within the next 1-3 weeks.  Please call us at [phone] if you have not heard about the biopsies in 3 weeks.    SIGNATURES/CONFIDENTIALITY: You and/or your care partner have signed paperwork which will be entered into your electronic medical record.  These signatures attest to the fact that that the information above on your After Visit Summary has been reviewed  and is understood.  Full responsibility of the confidentiality of this discharge information lies with you and/or your care-partner. 

## 2019-10-05 ENCOUNTER — Telehealth: Payer: Self-pay | Admitting: *Deleted

## 2019-10-05 ENCOUNTER — Telehealth: Payer: Self-pay

## 2019-10-05 NOTE — Telephone Encounter (Signed)
  Follow up Call-  Call back number 10/03/2019  Post procedure Call Back phone  # [phone]  Permission to leave phone message Yes  Some recent data might be hidden     No answer at 2nd attempt follow up phone call.  Left message on voicemail.

## 2019-10-05 NOTE — Telephone Encounter (Signed)
Called [phone] and left a message we tried to reach pt for a follow up call. maw

## 2019-10-11 ENCOUNTER — Other Ambulatory Visit: Payer: Self-pay | Admitting: Psychiatry

## 2019-10-11 NOTE — Telephone Encounter (Signed)
90 day sent to [facility] 

## 2019-10-17 ENCOUNTER — Other Ambulatory Visit: Payer: Self-pay | Admitting: Psychiatry

## 2019-11-11 ENCOUNTER — Other Ambulatory Visit: Payer: Self-pay | Admitting: Psychiatry

## 2019-11-15 NOTE — Telephone Encounter (Signed)
Apt 10/07

## 2019-11-24 ENCOUNTER — Ambulatory Visit (INDEPENDENT_AMBULATORY_CARE_PROVIDER_SITE_OTHER): Payer: 59 | Admitting: Psychiatry

## 2019-11-24 ENCOUNTER — Other Ambulatory Visit: Payer: Self-pay

## 2019-11-24 ENCOUNTER — Encounter: Payer: Self-pay | Admitting: Psychiatry

## 2019-11-24 DIAGNOSIS — F411 Generalized anxiety disorder: Secondary | ICD-10-CM

## 2019-11-24 DIAGNOSIS — F5081 Binge eating disorder: Secondary | ICD-10-CM | POA: Diagnosis not present

## 2019-11-24 DIAGNOSIS — F4001 Agoraphobia with panic disorder: Secondary | ICD-10-CM

## 2019-11-24 DIAGNOSIS — G250 Essential tremor: Secondary | ICD-10-CM

## 2019-11-24 DIAGNOSIS — G2581 Restless legs syndrome: Secondary | ICD-10-CM | POA: Diagnosis not present

## 2019-11-24 DIAGNOSIS — F5105 Insomnia due to other mental disorder: Secondary | ICD-10-CM

## 2019-11-24 MED ORDER — TRAZODONE HCL 50 MG PO TABS: 50.0000 mg | ORAL_TABLET | Freq: Every day | ORAL | 1 refills | Status: DC

## 2019-11-24 NOTE — Progress Notes (Signed)
Pamela Little [id number] 12-23-1970 49 y.o.   Subjective:   Patient ID:  Pamela Little is a 49 y.o. (DOB [birth year]) female.  Chief Complaint:  No chief complaint on file.   Anxiety Symptoms include nervous/anxious behavior. Patient reports no confusion or decreased concentration.    Medication Refill Pertinent negatives include no congestion or weakness.   Pamela Little presents to the office today for follow-up ofweight concerns and pharmacy problems and FU anxiety.  When seen May 2020.  She was having insomnia as we increased the Xanax to 0.5 mg twice daily and 1 mg nightly.  She continued Wellbutrin XL 300 mg every morning, sertraline 150, ropinirole for restless legs, propranolol as needed for anxiety, naltrexone for weight gain. Also taking pramipexole ER.     seen September.  Following change: DT insomnia increase Xanax 0.5 mg BID and 1mg  HS.  Last seen February 16, 2019.  The following was noted: Hanging in there.  Quit taking Naltrexone and pramipexole ER 3mg  .  After tennis would feel unusually fatigued and like she might pass out. More RLS in the afternoon without the pramipexole  The following medical decisions were made: Re: RLS is still on unusual med combination for the following reason: Pramipexole only interfered with sleep.  So stopped. Ropinirole only was too fatiguing. So taking the mixture of the 2 for severe RLS Failed iron trials for RLS Trial gabapentin 300 mg 2 twice daily for RLS.  04/2019 appt noted: She does not feel the gabapentin does much so still taking the ropinirole. No SE.   Milder RLS.   Since the higher dosage of Wellbutrin did not help with weight will leave at 300mg  daily.  May retry higherdose once son is placed.  Consider phentermine. Has been told she's in menopause and is having sweating and hot flashes.  Sleep comes and goes and is not consistent.  Overall mentally OK for the most part.  Definitely anxious.  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  Not depressed.   Can be chronically tired.  Always upset with her weight.  Working normal 40 + hours.  Chronically stressed. Has been a little anxious and been stressed with H sick with leukemia.  Son 61 yo still driving them nuts and not doing school.  Still struggling big time with him.  That will make her weepy.     Definitely anxious.  Struggles with weight.  Plans to see GYN  Trouble staying asleep and 5 hours average, not enough.  Some awakening with anxiety and worrying over son being out and whether he's safe.  Typical Xanax 0.5mg  TID Took Wellb 450 am for awhile and did not lose weight or see any benefit or SE.  Emotional eater and stressed by son's mental health and substance abuse problems and refusing school.  Very angry and defiant.  Anticipates placement somewhere.  Drinking and pot.  Chronically anxious.  Hard on the whole family.  Refusing church.  Pt doesn't think changing her med will help.  I keep eating.  Not ususal for her.  Not dep but upset with her son.  No severe panic attacks.  Best very frustrated with her binge eating at this time but feels the stress at home is precipitating it.  Switched Ativan to Xanax and been OK with that.  Not sure re: differences except sleeps better ususally.    Tremor manageable. Plan:   Change to 1 mg Xanax DT QL.   Re:  RLS is still on unusual med combination for the following reason: Pramipexole only interfered with sleep. So stopped. Ropinirole only was too fatiguing.  Failed iron trials for RLS Trial higher gabapentin 600 mg 2 twice daily for RLS.  Might also help anxiety. Caffeine 1 diet Coke or less.    11/24/19 appt noted: A lot of stress.  2 panic attacks.  Never been so busy and stressed as now.  Menopause.  Mind racing at night and not resting well. Had stopped naltrexone and ER pramipexole bc felt it was sedating. RLS is still a problem and reduced ropinirole and pramipexole to deal with sedative SE. Wishes  she slept better.  CO EMA usually not due to RLS.  Feels RLS more in hands. Sleep 4-5 hours average. Tries to do too much and trouble finishing things.  Son has ADD and wonders if that's part of her problem.  Stress H with Leukemia and recent hospitalization for heart problems.  Past Psychiatric Medication Trials:  Fluoxetine, Wellbutrin 450 no wt loss,  venlafaxine, sertraline 300, Lexapro 30, fluvoxamine, buspirone,  clonazepam, lorazepam, Xanax Abilify,  Vistaril, propranolol, Mirapex, naltrexone,  topiramate 200 fatigue, Pramipexole only interfered with sleep. Pramipexole ER Ropinirole only was too fatiguing. So taking the mixture of the 2 for severe RLS Failed iron trials for RLS Gabapentin 600 Naltrexone excessive weakness off it.  Review of Systems:  Review of Systems  HENT: Negative for congestion.   Neurological: Negative for tremors and weakness.  Psychiatric/Behavioral: Positive for sleep disturbance. Negative for agitation, behavioral problems, confusion, decreased concentration, dysphoric mood, hallucinations and self-injury. The patient is nervous/anxious. The patient is not hyperactive.     Medications: I have reviewed the patient's current medications.  Current Outpatient Medications  Medication Sig Dispense Refill  . ADVAIR DISKUS 250-50 MCG/DOSE AEPB TAKE 1 PUFF BY MOUTH TWICE A DAY    . albuterol (VENTOLIN HFA) 108 (90 Base) MCG/ACT inhaler TAKE 2 PUFFS BY MOUTH EVERY 4 HOURS AS NEEDED    . ALPRAZolam (XANAX) 0.5 MG tablet TAKE 1 TABLET BY MOUTH TWICE A DAY PLUS 2 AT BEDTIME AS NEEDED FOR SLEEP 90 tablet 2  . buPROPion (WELLBUTRIN XL) 300 MG 24 hr tablet TAKE 1 TABLET DAILY 90 tablet 3  . montelukast (SINGULAIR) 10 MG tablet Take 10 mg by mouth daily.     . pramipexole (MIRAPEX) 0.125 MG tablet Take 0.125 mg by mouth every evening.     Marland Kitchen rOPINIRole (REQUIP) 1 MG tablet TAKE 3 TABLETS AT BEDTIME (Patient taking differently: Take 2 mg by mouth at bedtime. TAKE 3  TABLETS AT BEDTIME) 270 tablet 1  . sertraline (ZOLOFT) 100 MG tablet TAKE 1 AND 1/2 TABLETS DAILY BY MOUTH 45 tablet 5   No current facility-administered medications for this visit.    Medication Side Effects: None  Allergies: No Known Allergies  Past Medical History:  Diagnosis Date  . Asthma    with activity  . Migraine 03/21/2014  . Panic attacks   . Restless legs syndrome 03/21/2014  . 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 Vertigo     Family History  Problem Relation Age of Onset  . Hypertension Mother   . Hypertension Father   . Tremor Sister   . Heart attack Neg Hx   . Diabetes Neg Hx   . Colon cancer Neg Hx   . Colon polyps Neg Hx   . Stomach cancer Neg Hx   . Esophageal cancer Neg Hx     Social History  Socioeconomic History  . Marital status: Married    Spouse name: Not on file  . Number of children: 3  . Years of education: college  . Highest education level: Not on file  Occupational History  . Not on file  Tobacco Use  . Smoking status: Never Smoker  . Smokeless tobacco: Never Used  Vaping Use  . Vaping Use: Never used  Substance and Sexual Activity  . Alcohol use: No    Comment: occasionaly  . Drug use: Never  . Sexual activity: Not on file  Other Topics Concern  . Not on file  Social History Narrative   Patient is right handed.   Patient drinks 1 cup caffeine daily.   Social Determinants of Health   Financial Resource Strain:   . Difficulty of Paying Living Expenses: Not on file  Food Insecurity:   . Worried About Programme researcher, broadcasting/film/videounning Out of Food in the Last Year: Not on file  . Ran Out of Food in the Last Year: Not on file  Transportation Needs:   . Lack of Transportation (Medical): Not on file  . Lack of Transportation (Non-Medical): Not on file  Physical Activity:   . Days of Exercise per Week: Not on file  . Minutes of Exercise per Session: Not on file  Stress:   . Feeling of Stress : Not on file  Social Connections:   . Frequency of  Communication with Friends and Family: Not on file  . Frequency of Social Gatherings with Friends and Family: Not on file  . Attends Religious Services: Not on file  . Active Member of Clubs or Organizations: Not on file  . Attends BankerClub or Organization Meetings: Not on file  . Marital Status: Not on file  Intimate Partner Violence:   . Fear of Current or Ex-Partner: Not on file  . Emotionally Abused: Not on file  . Physically Abused: Not on file  . Sexually Abused: Not on file    Past Medical History, Surgical history, Social history, and Family history were reviewed and updated as appropriate.   Please see review of systems for further details on the patient's review from today.   Objective:   Physical Exam:  There were no vitals taken for this visit.  Physical Exam Constitutional:      General: She is not in acute distress. Musculoskeletal:        General: No deformity.  Neurological:     Mental Status: She is alert and oriented to person, place, and time.     Cranial Nerves: No dysarthria.     Coordination: Coordination normal.  Psychiatric:        Attention and Perception: Perception normal. She is inattentive. She does not perceive auditory or visual hallucinations.        Mood and Affect: Mood is anxious. Mood is not depressed. Affect is not labile, blunt, angry or inappropriate.        Speech: Speech normal.        Behavior: Behavior normal. Behavior is cooperative.        Thought Content: Thought content normal. Thought content is not paranoid or delusional. Thought content does not include homicidal or suicidal ideation. Thought content does not include homicidal or suicidal plan.        Cognition and Memory: Cognition and memory normal.        Judgment: Judgment normal.     Comments: Insight pretty good. She is chronically anxious it is never been completely controlled.  Lab Review:     Component Value Date/Time   NA 137 06/18/2018 1901   K 3.4 (L)  06/18/2018 1901   CL 104 06/18/2018 1901   CO2 24 06/18/2018 1901   GLUCOSE 107 (H) 06/18/2018 1901   BUN 16 06/18/2018 1901   CREATININE 0.96 06/18/2018 1901   CALCIUM 8.8 (L) 06/18/2018 1901   GFRNONAA >60 06/18/2018 1901   GFRAA >60 06/18/2018 1901       Component Value Date/Time   WBC 12.4 (H) 06/18/2018 1901   RBC 4.43 06/18/2018 1901   HGB 13.4 06/18/2018 1901   HCT 41.0 06/18/2018 1901   PLT 228 06/18/2018 1901   MCV 92.6 06/18/2018 1901   MCH 30.2 06/18/2018 1901   MCHC 32.7 06/18/2018 1901   RDW 13.4 06/18/2018 1901    No results found for: POCLITH, LITHIUM   No results found for: PHENYTOIN, PHENOBARB, VALPROATE, CBMZ   .res Assessment: Plan:    Generalized anxiety disorder  Panic disorder with agoraphobia  Restless legs syndrome  Binge-eating disorder, moderate  Insomnia due to mental condition  Tremor, essential   Supportive and problem focused therapy on trying to get son psych help.  Disc binge eating.  Consider counseling or support group for this but she is overwhelmed at the moment trying to deal with her son.  We will consider this after he is placed.  Her anxiety is less well managed with medications but a lot of it has to do with external stressors including her son and her husband's leukemia.  Restless legs is under fair control with the medicines.  Tremor managed with the meds.   Change to 1 mg Xanax DT QL.  We discussed the short-term risks associated with benzodiazepines including sedation and increased fall risk among others.  Discussed long-term side effect risk including dependence, potential withdrawal symptoms, and the potential eventual dose-related risk of dementia. Disc new studies refuting the link.  Re: RLS is still on unusual med combination for the following reason: Pramipexole only interfered with sleep. So stopped. Ropinirole only was too fatiguing.  Disc insomnia trazodone vs Sonata.    Disc her questions about ADD.  Need  to address sleep problems first.  Disc Ozempic with PCP for weight loss.  Gave a copy of recent study. Discussed this further.  Disc questions about Covid vaccine.  Since the higher dosage of Wellbutrin did not help with weight will leave at 300mg  daily.  May retry higherdose once son is placed.  Consider phentermine.  Push fluids.  Doesn't drink enough water.  No med changes indicated and she agrees.  .25 min appt.  FU 3 mos.  , MD, DFAPA   Please see After Visit Summary for patient specific instructions.  No future appointments.  No orders of the defined types were placed in this encounter.     -------------------------------

## 2019-12-01 ENCOUNTER — Other Ambulatory Visit: Payer: Self-pay | Admitting: Psychiatry

## 2019-12-02 NOTE — Telephone Encounter (Signed)
Please review

## 2020-01-02 ENCOUNTER — Other Ambulatory Visit: Payer: Self-pay | Admitting: Psychiatry

## 2020-01-18 ENCOUNTER — Other Ambulatory Visit: Payer: Self-pay | Admitting: Psychiatry

## 2020-01-18 DIAGNOSIS — F5105 Insomnia due to other mental disorder: Secondary | ICD-10-CM

## 2020-03-03 ENCOUNTER — Other Ambulatory Visit: Payer: Self-pay | Admitting: Psychiatry

## 2020-03-03 ENCOUNTER — Other Ambulatory Visit: Payer: Self-pay | Admitting: Gastroenterology

## 2020-03-03 DIAGNOSIS — Z1211 Encounter for screening for malignant neoplasm of colon: Secondary | ICD-10-CM

## 2020-03-22 ENCOUNTER — Other Ambulatory Visit: Payer: Self-pay | Admitting: Psychiatry

## 2020-03-22 DIAGNOSIS — F5105 Insomnia due to other mental disorder: Secondary | ICD-10-CM

## 2020-04-02 ENCOUNTER — Other Ambulatory Visit: Payer: Self-pay | Admitting: Psychiatry

## 2020-04-03 ENCOUNTER — Other Ambulatory Visit: Payer: Self-pay | Admitting: Psychiatry

## 2020-04-06 ENCOUNTER — Other Ambulatory Visit: Payer: Self-pay | Admitting: Psychiatry

## 2020-04-09 ENCOUNTER — Other Ambulatory Visit: Payer: Self-pay | Admitting: Psychiatry

## 2020-04-09 ENCOUNTER — Telehealth: Payer: Self-pay | Admitting: Psychiatry

## 2020-04-09 NOTE — Telephone Encounter (Signed)
Next visit is 06/05/20. Requesting refill on Xanax called to [facility] on [address], Phone # is [phone].

## 2020-04-17 ENCOUNTER — Other Ambulatory Visit: Payer: Self-pay | Admitting: Family Medicine

## 2020-04-17 DIAGNOSIS — Z1231 Encounter for screening mammogram for malignant neoplasm of breast: Secondary | ICD-10-CM

## 2020-05-07 ENCOUNTER — Other Ambulatory Visit: Payer: Self-pay

## 2020-05-07 ENCOUNTER — Ambulatory Visit: Payer: 59 | Admitting: Cardiology

## 2020-05-07 ENCOUNTER — Encounter: Payer: Self-pay | Admitting: Cardiology

## 2020-05-07 ENCOUNTER — Inpatient Hospital Stay: Payer: 59

## 2020-05-07 VITALS — BP 117/73 | HR 66 | Temp 98.2°F | Resp 16 | Ht 68.0 in | Wt 221.8 lb

## 2020-05-07 DIAGNOSIS — E6609 Other obesity due to excess calories: Secondary | ICD-10-CM

## 2020-05-07 DIAGNOSIS — Z8616 Personal history of COVID-19: Secondary | ICD-10-CM

## 2020-05-07 DIAGNOSIS — R000 Tachycardia, unspecified: Secondary | ICD-10-CM

## 2020-05-07 DIAGNOSIS — R072 Precordial pain: Secondary | ICD-10-CM

## 2020-05-07 DIAGNOSIS — Z6833 Body mass index (BMI) 33.0-33.9, adult: Secondary | ICD-10-CM

## 2020-05-07 DIAGNOSIS — R0602 Shortness of breath: Secondary | ICD-10-CM

## 2020-05-07 NOTE — Progress Notes (Signed)
Date:  05/07/2020   ID:  Pamela Little, DOB [birth year], MRN [id number]  PCP:  Donald Prose, MD  Cardiologist:  Rex Kras, DO, [facility] (established care 04/20/19/2022) Former Cardiology Providers: NA  REASON FOR CONSULT: Racing heartbeat.  REQUESTING PHYSICIAN:  Donald Prose, MD [address],  [place] [id number]  Chief Complaint  Patient presents with  . Tachycardia  . New Patient (Initial Visit)  . Chest Pain    HPI  Pamela Little is a 50 y.o. female who presents to the office with a chief complaint of "chest tightness and palpitations." Patient's past medical history and cardiovascular risk factors include: History of COVID-19 infection, asthma, depression, insomnia, panic attacks, restless leg syndrome, postmenopausal, obesity.  She is referred to the office at the request of Donald Prose, MD for evaluation of racing heartbeat.  Palpitations: Patient states that she has been having palpitations for the last 2 years and no significant change in intensity, frequency, and/or duration.  Patient states that she plays tennis for couple hours twice a week and hours later still notices that her smart watch tells her that the heart rate is greater than 100 beats a minute.  Symptoms usually improve after resting.  She consumes no more than 2 diet Cokes per day and 1 cup of coffee.  She denies any new medications, no over-the-counter formulations, no weight loss supplements, no stimulants, and no consumption of energy drinks.  Patient states that she had a similar event more than 20 years ago after having her third child at that time she had a Holter monitor placed which was unremarkable.  No records available for review.  Chest pain: Patient describes as chest tightness that is intermittent over the left breast.  Present for the last 1 year.  No improving or worsening factors.  Not brought on by effort related activities and does not resolve with rest.  Usually  self-limited.  No known thyroid disease.  FUNCTIONAL STATUS: Regularly plays tennis for 2-3 hours twice a week.    ALLERGIES: No Known Allergies  MEDICATION LIST PRIOR TO VISIT: Current Meds  Medication Sig  . albuterol (VENTOLIN HFA) 108 (90 Base) MCG/ACT inhaler TAKE 2 PUFFS BY MOUTH EVERY 4 HOURS AS NEEDED  . ALPRAZolam (XANAX) 0.5 MG tablet TAKE 1 TABLET BY MOUTH TWICE A DAY PLUS 2 AT BEDTIME AS NEEDED FOR SLEEP  . buPROPion (WELLBUTRIN XL) 300 MG 24 hr tablet TAKE 1 TABLET BY MOUTH EVERY DAY  . montelukast (SINGULAIR) 10 MG tablet Take 10 mg by mouth daily.   . pramipexole (MIRAPEX) 0.125 MG tablet Take 0.125 mg by mouth every evening.   Marland Kitchen rOPINIRole (REQUIP) 1 MG tablet TAKE 3 TABLETS AT BEDTIME  . sertraline (ZOLOFT) 100 MG tablet TAKE 1 AND 1/2 TABLETS BY MOUTH DAILY  . traZODone (DESYREL) 50 MG tablet TAKE 1 TO 2 TABLETS BY MOUTH AT BEDTIME     PAST MEDICAL HISTORY: Past Medical History:  Diagnosis Date  . Asthma    with activity  . Depression   . Migraine 03/21/2014  . Panic attacks   . Restless legs syndrome 03/21/2014  . 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 Vertigo     PAST SURGICAL HISTORY: Past Surgical History:  Procedure Laterality Date  . ABLATION ON ENDOMETRIOSIS    . CESAREAN SECTION WITH BILATERAL TUBAL LIGATION    . MYRINGOTOMY WITH TUBE PLACEMENT    . TONSILLECTOMY      FAMILY HISTORY: The patient family history  includes Hypertension in her father and mother; Tremor in her sister.  SOCIAL HISTORY:  The patient  reports that she has never smoked. She has never used smokeless tobacco. She reports current alcohol use of about 1.0 standard drink of alcohol per week. She reports that she does not use drugs.  REVIEW OF SYSTEMS: Review of Systems  Constitutional: Positive for weight gain. Negative for chills and fever.  HENT: Negative for hoarse voice and nosebleeds.   Eyes: Negative for discharge, double vision and pain.  Cardiovascular: Positive for chest  pain (chest tightness), leg swelling and palpitations. Negative for claudication, dyspnea on exertion, near-syncope, orthopnea, paroxysmal nocturnal dyspnea and syncope.  Respiratory: Positive for shortness of breath and wheezing. Negative for hemoptysis.   Musculoskeletal: Negative for muscle cramps and myalgias.  Gastrointestinal: Negative for abdominal pain, constipation, diarrhea, hematemesis, hematochezia, melena, nausea and vomiting.  Neurological: Positive for vertigo. Negative for dizziness and light-headedness.    PHYSICAL EXAM: Vitals with BMI 05/07/2020 10/03/2019 10/03/2019  Height _0  - -  Weight 221 lbs 13 oz - -  BMI 41.63 - -  Systolic 845 89 95  Diastolic 73 45 58  Pulse 66 61 71  Some encounter information is confidential and restricted. Go to Review Flowsheets activity to see all data.   CONSTITUTIONAL: Well-developed and well-nourished. No acute distress.  SKIN: Skin is warm and dry. No rash noted. No cyanosis. No pallor. No jaundice HEAD: Normocephalic and atraumatic.  EYES: No scleral icterus MOUTH/THROAT: Moist oral membranes.  NECK: No JVD present. No thyromegaly noted. No carotid bruits  LYMPHATIC: No visible cervical adenopathy.  CHEST Normal respiratory effort. No intercostal retractions  LUNGS: Clear to auscultation bilaterally.  No stridor. No wheezes. No rales.  CARDIOVASCULAR: Regular rate and rhythm, positive S1-S2, no murmurs rubs or gallops appreciated. ABDOMINAL: Obese, soft, nontender, nondistended, positive bowel sounds in all 4 quadrants. no apparent ascites.  EXTREMITIES: No peripheral edema  HEMATOLOGIC: No significant bruising NEUROLOGIC: Oriented to person, place, and time. Nonfocal. Normal muscle tone.  PSYCHIATRIC: Normal mood and affect. Normal behavior. Cooperative  CARDIAC DATABASE: EKG: 05/07/2020: Normal sinus rhythm, 63 bpm, normal axis, without underlying ischemia or injury.    Echocardiogram: No results found for this or any  previous visit from the past 1095 days.   Stress Testing: No results found for this or any previous visit from the past 1095 days.  Heart Catheterization: None  LABORATORY DATA: CBC Latest Ref Rng & Units 06/18/2018  WBC 4.0 - 10.5 K/uL 12.4(H)  Hemoglobin 12.0 - 15.0 g/dL 13.4  Hematocrit 36.0 - 46.0 % 41.0  Platelets 150 - 400 K/uL 228    CMP Latest Ref Rng & Units 06/18/2018  Glucose 70 - 99 mg/dL 107(H)  BUN 6 - 20 mg/dL 16  Creatinine 0.44 - 1.00 mg/dL 0.96  Sodium 135 - 145 mmol/L 137  Potassium 3.5 - 5.1 mmol/L 3.4(L)  Chloride 98 - 111 mmol/L 104  CO2 22 - 32 mmol/L 24  Calcium 8.9 - 10.3 mg/dL 8.8(L)    Lipid Panel  No results found for: CHOL, TRIG, HDL, CHOLHDL, VLDL, LDLCALC, LDLDIRECT, LABVLDL  No components found for: NTPROBNP No results for input(s): PROBNP in the last 8760 hours. No results for input(s): TSH in the last 8760 hours.  BMP No results for input(s): NA, K, CL, CO2, GLUCOSE, BUN, CREATININE, CALCIUM, GFRNONAA, GFRAA in the last 8760 hours.  HEMOGLOBIN A1C No results found for: HGBA1C, MPG  External Labs: Collected: 04/2020, provided by PCP  Hemoglobin 12.4 g/dL, hematocrit 37.4% Creatinine 0.95 mg/dL. eGFR: 62 mL/min per 1.73 m Lipid profile: Total cholesterol 190, triglycerides 107, HDL 56, LDL 115, non-HDL 134 TSH: 1.98,  IMPRESSION:    ICD-10-CM   1. Tachycardia  R00.0 EKG 12-Lead    LONG TERM MONITOR (3-14 DAYS)  2. History of COVID-19  Z86.16   3. Precordial pain  R07.2 PCV ECHOCARDIOGRAM COMPLETE    PCV CARDIAC STRESS TEST    SARS-COV-2 RNA,(COVID-19) QUAL NAAT  4. Shortness of breath  R06.02 PCV ECHOCARDIOGRAM COMPLETE    PCV CARDIAC STRESS TEST  5. Class 1 obesity due to excess calories without serious comorbidity with body mass index (BMI) of 33.0 to 33.9 in adult  E66.09    Z68.33      RECOMMENDATIONS: LELLA MULLANY is a 50 y.o. female whose past medical history and cardiac risk factors include: History of COVID-19  infection, asthma, depression, insomnia, panic attacks, restless leg syndrome, postmenopausal, obesity.  Palpitations: EKG shows normal sinus rhythm without underlying injury pattern or ectopy. No identifiable reversible causes. TSH within normal limits. 14-day extended Holter monitor to evaluate for dysrhythmias. Outside labs provided by PCP independently reviewed during today's encounter and summarized findings noted above.  Precordial pain: Patient symptoms appears to be noncardiac in etiology. However, given her prolonged symptoms of left-sided chest pain and dyspnea recommend exercise treadmill stress test to evaluate for exercise-induced ischemia, exercise-induced dysrhythmias. Echocardiogram will be ordered to evaluate for structural heart disease and left ventricular systolic function. Covid screen prior to GXT.  Shortness of breath: See above  History of COVID-19 infection: Patient has been vaccinated with the first and second dose of Pfizer vaccine.  Recommended that she complete her series by getting the booster.   FINAL MEDICATION LIST END OF ENCOUNTER: No orders of the defined types were placed in this encounter.   Medications Discontinued During This Encounter  Medication Reason  . ADVAIR DISKUS 250-50 MCG/DOSE AEPB Error     Current Outpatient Medications:  .  albuterol (VENTOLIN HFA) 108 (90 Base) MCG/ACT inhaler, TAKE 2 PUFFS BY MOUTH EVERY 4 HOURS AS NEEDED, Disp: , Rfl:  .  ALPRAZolam (XANAX) 0.5 MG tablet, TAKE 1 TABLET BY MOUTH TWICE A DAY PLUS 2 AT BEDTIME AS NEEDED FOR SLEEP, Disp: 90 tablet, Rfl: 1 .  buPROPion (WELLBUTRIN XL) 300 MG 24 hr tablet, TAKE 1 TABLET BY MOUTH EVERY DAY, Disp: 90 tablet, Rfl: 0 .  montelukast (SINGULAIR) 10 MG tablet, Take 10 mg by mouth daily. , Disp: , Rfl:  .  pramipexole (MIRAPEX) 0.125 MG tablet, Take 0.125 mg by mouth every evening. , Disp: , Rfl:  .  rOPINIRole (REQUIP) 1 MG tablet, TAKE 3 TABLETS AT BEDTIME, Disp: 270  tablet, Rfl: 3 .  sertraline (ZOLOFT) 100 MG tablet, TAKE 1 AND 1/2 TABLETS BY MOUTH DAILY, Disp: 45 tablet, Rfl: 5 .  traZODone (DESYREL) 50 MG tablet, TAKE 1 TO 2 TABLETS BY MOUTH AT BEDTIME, Disp: 180 tablet, Rfl: 0  Orders Placed This Encounter  Procedures  . SARS-COV-2 RNA,(COVID-19) QUAL NAAT  . PCV CARDIAC STRESS TEST  . LONG TERM MONITOR (3-14 DAYS)  . EKG 12-Lead  . PCV ECHOCARDIOGRAM COMPLETE    There are no Patient Instructions on file for this visit.   --Continue cardiac medications as reconciled in final medication list. --Return in about 4 weeks (around 06/04/2020) for Follow up, Palpitations, review monitor results. Or sooner if needed. --Continue follow-up with your primary care physician regarding the  management of your other chronic comorbid conditions.  Patient's questions and concerns were addressed to her satisfaction. She voices understanding of the instructions provided during this encounter.   This note was created using a voice recognition software as a result there may be grammatical errors inadvertently enclosed that do not reflect the nature of this encounter. Every attempt is made to correct such errors.  Rex Kras, [place], [facility]  Pager: [phone] Office: [phone]

## 2020-05-19 ENCOUNTER — Other Ambulatory Visit (HOSPITAL_COMMUNITY)
Admission: RE | Admit: 2020-05-19 | Discharge: 2020-05-19 | Disposition: A | Discharge status: Home / Self Care | Payer: 59 | Source: Ambulatory Visit | Attending: Cardiology | Admitting: Cardiology

## 2020-05-19 DIAGNOSIS — Z01812 Encounter for preprocedural laboratory examination: Secondary | ICD-10-CM | POA: Diagnosis not present

## 2020-05-19 DIAGNOSIS — Z20822 Contact with and (suspected) exposure to covid-19: Secondary | ICD-10-CM | POA: Diagnosis not present

## 2020-05-19 LAB — SARS CORONAVIRUS 2 (TAT 6-24 HRS): SARS Coronavirus 2: NEGATIVE

## 2020-05-21 ENCOUNTER — Ambulatory Visit: Payer: 59

## 2020-05-21 ENCOUNTER — Other Ambulatory Visit: Payer: Self-pay

## 2020-05-21 DIAGNOSIS — R0602 Shortness of breath: Secondary | ICD-10-CM

## 2020-05-21 DIAGNOSIS — R072 Precordial pain: Secondary | ICD-10-CM

## 2020-05-22 ENCOUNTER — Ambulatory Visit: Payer: 59

## 2020-05-22 DIAGNOSIS — R072 Precordial pain: Secondary | ICD-10-CM

## 2020-05-22 DIAGNOSIS — R0602 Shortness of breath: Secondary | ICD-10-CM

## 2020-06-05 ENCOUNTER — Ambulatory Visit (INDEPENDENT_AMBULATORY_CARE_PROVIDER_SITE_OTHER): Payer: 59 | Admitting: Psychiatry

## 2020-06-05 ENCOUNTER — Encounter: Payer: Self-pay | Admitting: Psychiatry

## 2020-06-05 ENCOUNTER — Other Ambulatory Visit: Payer: Self-pay

## 2020-06-05 DIAGNOSIS — F4001 Agoraphobia with panic disorder: Secondary | ICD-10-CM | POA: Diagnosis not present

## 2020-06-05 DIAGNOSIS — F5081 Binge eating disorder: Secondary | ICD-10-CM

## 2020-06-05 DIAGNOSIS — G2581 Restless legs syndrome: Secondary | ICD-10-CM

## 2020-06-05 DIAGNOSIS — F411 Generalized anxiety disorder: Secondary | ICD-10-CM

## 2020-06-05 DIAGNOSIS — G250 Essential tremor: Secondary | ICD-10-CM

## 2020-06-05 DIAGNOSIS — F5105 Insomnia due to other mental disorder: Secondary | ICD-10-CM

## 2020-06-05 MED ORDER — TRAZODONE HCL 50 MG PO TABS: 50.0000 mg | ORAL_TABLET | Freq: Every day | ORAL | 0 refills | Status: DC

## 2020-06-05 MED ORDER — BUPROPION HCL ER (XL) 150 MG PO TB24: 150.0000 mg | ORAL_TABLET | Freq: Every day | ORAL | 0 refills | Status: DC

## 2020-06-05 MED ORDER — SERTRALINE HCL 100 MG PO TABS
1.5000 | ORAL_TABLET | Freq: Every day | ORAL | 1 refills | Status: DC
Start: 2020-06-05 — End: 2020-11-14

## 2020-06-05 MED ORDER — PRAMIPEXOLE DIHYDROCHLORIDE 0.125 MG PO TABS: 0.3750 mg | ORAL_TABLET | Freq: Every evening | ORAL | 0 refills | Status: DC

## 2020-06-05 NOTE — Progress Notes (Signed)
Pamela AspJoanna B Krahenbuhl [id number] 1970-07-26 50 y.o.   Subjective:   Patient ID:  Pamela Little is a 50 y.o. (DOB 1970-07-26) female.  Chief Complaint:  Chief Complaint  Patient presents with  . Follow-up  . Generalized anxiety disorder    Anxiety Symptoms include nervous/anxious behavior and palpitations. Patient reports no confusion or decreased concentration.    Medication Refill Pertinent negatives include no congestion or weakness.   Pamela AspJoanna B Blahnik presents to the office today for follow-up ofweight concerns and pharmacy problems and FU anxiety.  When seen May 2020.  She was having insomnia as we increased the Xanax to 0.5 mg twice daily and 1 mg nightly.  She continued Wellbutrin XL 300 mg every morning, sertraline 150, ropinirole for restless legs, propranolol as needed for anxiety, naltrexone for weight gain. Also taking pramipexole ER.     seen September.  Following change: DT insomnia increase Xanax 0.5 mg BID and 1mg  HS.  Last seen February 16, 2019.  The following was noted: Hanging in there.  Quit taking Naltrexone and pramipexole ER 3mg  .  After tennis would feel unusually fatigued and like she might pass out. More RLS in the afternoon without the pramipexole  The following medical decisions were made: Re: RLS is still on unusual med combination for the following reason: Pramipexole only interfered with sleep.  So stopped. Ropinirole only was too fatiguing. So taking the mixture of the 2 for severe RLS Failed iron trials for RLS Trial gabapentin 300 mg 2 twice daily for RLS.  04/2019 appt noted: She does not feel the gabapentin does much so still taking the ropinirole. No SE.   Milder RLS.   Since the higher dosage of Wellbutrin did not help with weight will leave at 300mg  daily.  May retry higherdose once son is placed.  Consider phentermine. Has been told she's in menopause and is having sweating and hot flashes.  Sleep comes and goes and is not consistent.  Overall  mentally OK for the most part.  Definitely anxious.  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  Not depressed.   Can be chronically tired.  Always upset with her weight.  Working normal 40 + hours.  Chronically stressed. Has been a little anxious and been stressed with H sick with leukemia.  Son 51 yo still driving them nuts and not doing school.  Still struggling big time with him.  That will make her weepy.     Definitely anxious.  Struggles with weight.  Plans to see GYN  Trouble staying asleep and 5 hours average, not enough.  Some awakening with anxiety and worrying over son being out and whether he's safe.  Typical Xanax 0.5mg  TID Took Wellb 450 am for awhile and did not lose weight or see any benefit or SE.  Emotional eater and stressed by son's mental health and substance abuse problems and refusing school.  Very angry and defiant.  Anticipates placement somewhere.  Drinking and pot.  Chronically anxious.  Hard on the whole family.  Refusing church.  Pt doesn't think changing her med will help.  I keep eating.  Not ususal for her.  Not dep but upset with her son.  No severe panic attacks.  Best very frustrated with her binge eating at this time but feels the stress at home is precipitating it.  Switched Ativan to Xanax and been OK with that.  Not sure re: differences except sleeps better ususally.    Tremor manageable. Plan:  Change to 1 mg Xanax DT QL.   Re: RLS is still on unusual med combination for the following reason: Pramipexole only interfered with sleep. So stopped. Ropinirole only was too fatiguing.  Failed iron trials for RLS Trial higher gabapentin 600 mg 2 twice daily for RLS.  Might also help anxiety. Caffeine 1 diet Coke or less.    11/24/19 appt noted: A lot of stress.  2 panic attacks.  Never been so busy and stressed as now.  Menopause.  Mind racing at night and not resting well. Had stopped naltrexone and ER pramipexole bc felt it was sedating. RLS is still a  problem and reduced ropinirole and pramipexole to deal with sedative SE. Wishes she slept better.  CO EMA usually not due to RLS.  Feels RLS more in hands. Sleep 4-5 hours average. Tries to do too much and trouble finishing things.  Son has ADD and wonders if that's part of her problem. Plan: no med changes  06/05/2020 appointment with the following noted: Doing OK. Covid December 2021.  Can still struggle with RLS in legs and hands. Not taking propranolol much. Asks to increase pramipexole for RLS to  0.375 mg in addition to Ropinirole 2 mg PM. Limited to 1 Coke Zero as late as late afternoon. Wonders about ADD. Not binge eating. History palpitations with cardiology appt next week. Some tremor and wonders about reducing Wellbutrin bc no problems with depression lately. Considering ADD tx.  Defer. Anxiety manageable.  Stress H with Leukemia and recent hospitalization for heart problems.  Past Psychiatric Medication Trials:  Fluoxetine, Wellbutrin 450 no wt loss,  venlafaxine, sertraline 300, Lexapro 30, fluvoxamine, buspirone,  clonazepam, lorazepam, Xanax Abilify,  Vistaril, propranolol, Mirapex, naltrexone,  topiramate 200 fatigue, Pramipexole only interfered with sleep. Pramipexole ER Ropinirole only was too fatiguing. So taking the mixture of the 2 for severe RLS Failed iron trials for RLS Gabapentin 600 Naltrexone excessive weakness.  Review of Systems:  Review of Systems  HENT: Negative for congestion.   Cardiovascular: Positive for palpitations.  Neurological: Negative for tremors and weakness.  Psychiatric/Behavioral: Positive for sleep disturbance. Negative for agitation, behavioral problems, confusion, decreased concentration, dysphoric mood, hallucinations and self-injury. The patient is nervous/anxious. The patient is not hyperactive.     Medications: I have reviewed the patient's current medications.  Current Outpatient Medications  Medication Sig Dispense  Refill  . ALPRAZolam (XANAX) 0.5 MG tablet TAKE 1 TABLET BY MOUTH TWICE A DAY PLUS 2 AT BEDTIME AS NEEDED FOR SLEEP 90 tablet 1  . buPROPion (WELLBUTRIN XL) 300 MG 24 hr tablet TAKE 1 TABLET BY MOUTH EVERY DAY 90 tablet 0  . montelukast (SINGULAIR) 10 MG tablet Take 10 mg by mouth daily.     . pramipexole (MIRAPEX) 0.125 MG tablet Take 0.125 mg by mouth every evening.     Marland Kitchen rOPINIRole (REQUIP) 1 MG tablet TAKE 3 TABLETS AT BEDTIME (Patient taking differently: TAKE 2 TABLETS AT BEDTIME) 270 tablet 3  . sertraline (ZOLOFT) 100 MG tablet TAKE 1 AND 1/2 TABLETS BY MOUTH DAILY 45 tablet 5  . traZODone (DESYREL) 50 MG tablet TAKE 1 TO 2 TABLETS BY MOUTH AT BEDTIME 180 tablet 0  . albuterol (VENTOLIN HFA) 108 (90 Base) MCG/ACT inhaler TAKE 2 PUFFS BY MOUTH EVERY 4 HOURS AS NEEDED (Patient not taking: Reported on 06/05/2020)     No current facility-administered medications for this visit.    Medication Side Effects: None  Allergies: No Known Allergies  Past Medical History:  Diagnosis Date  . Asthma    with activity  . Depression   . Migraine 03/21/2014  . Panic attacks   . Restless legs syndrome 03/21/2014  . 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 Vertigo     Family History  Problem Relation Age of Onset  . Hypertension Mother   . Hypertension Father   . Tremor Sister   . Heart attack Neg Hx   . Diabetes Neg Hx   . Colon cancer Neg Hx   . Colon polyps Neg Hx   . Stomach cancer Neg Hx   . Esophageal cancer Neg Hx     Social History   Socioeconomic History  . Marital status: Married    Spouse name: Not on file  . Number of children: 3  . Years of education: college  . Highest education level: Not on file  Occupational History  . Not on file  Tobacco Use  . Smoking status: Never Smoker  . Smokeless tobacco: Never Used  Vaping Use  . Vaping Use: Never used  Substance and Sexual Activity  . Alcohol use: Yes    Alcohol/week: 1.0 standard drink    Types: 1 Cans of beer per week     Comment: occasionaly  . Drug use: Never  . Sexual activity: Not on file  Other Topics Concern  . Not on file  Social History Narrative   Patient is right handed.   Patient drinks 1 cup caffeine daily.   Social Determinants of Health   Financial Resource Strain: Not on file  Food Insecurity: Not on file  Transportation Needs: Not on file  Physical Activity: Not on file  Stress: Not on file  Social Connections: Not on file  Intimate Partner Violence: Not on file    Past Medical History, Surgical history, Social history, and Family history were reviewed and updated as appropriate.   Please see review of systems for further details on the patient's review from today.   Objective:   Physical Exam:  There were no vitals taken for this visit.  Physical Exam Constitutional:      General: She is not in acute distress. Musculoskeletal:        General: No deformity.  Neurological:     Mental Status: She is alert and oriented to person, place, and time.     Cranial Nerves: No dysarthria.     Coordination: Coordination normal.  Psychiatric:        Attention and Perception: Perception normal. She is inattentive. She does not perceive auditory or visual hallucinations.        Mood and Affect: Mood is anxious. Mood is not depressed. Affect is not labile, blunt, angry or inappropriate.        Speech: Speech normal. Speech is not rapid and pressured.        Behavior: Behavior normal. Behavior is cooperative.        Thought Content: Thought content normal. Thought content is not paranoid or delusional. Thought content does not include homicidal or suicidal ideation. Thought content does not include homicidal or suicidal plan.        Cognition and Memory: Cognition and memory normal.        Judgment: Judgment normal.     Comments: Insight pretty good. She is chronically anxious it is never been completely controlled.       Lab Review:     Component Value Date/Time   NA 137  06/18/2018 1901   K 3.4 (L) 06/18/2018 1901  CL 104 06/18/2018 1901   CO2 24 06/18/2018 1901   GLUCOSE 107 (H) 06/18/2018 1901   BUN 16 06/18/2018 1901   CREATININE 0.96 06/18/2018 1901   CALCIUM 8.8 (L) 06/18/2018 1901   GFRNONAA >60 06/18/2018 1901   GFRAA >60 06/18/2018 1901       Component Value Date/Time   WBC 12.4 (H) 06/18/2018 1901   RBC 4.43 06/18/2018 1901   HGB 13.4 06/18/2018 1901   HCT 41.0 06/18/2018 1901   PLT 228 06/18/2018 1901   MCV 92.6 06/18/2018 1901   MCH 30.2 06/18/2018 1901   MCHC 32.7 06/18/2018 1901   RDW 13.4 06/18/2018 1901    No results found for: POCLITH, LITHIUM   No results found for: PHENYTOIN, PHENOBARB, VALPROATE, CBMZ   .res Assessment: Plan:    Generalized anxiety disorder  Panic disorder with agoraphobia  Binge-eating disorder, moderate  Restless legs syndrome  Insomnia due to mental condition  Tremor, essential    Disc various dx and status of current med regimen.  depression and anxiety are managed.  RLS is not.  Wonders about ADD. Binge eating managed.  Reduce Wellbutrin XL to 150 mg AM to see if tremor is better.   Change to 1 mg Xanax DT QL.  We discussed the short-term risks associated with benzodiazepines including sedation and increased fall risk among others.  Discussed long-term side effect risk including dependence, potential withdrawal symptoms, and the potential eventual dose-related risk of dementia. Disc new studies refuting the link.  Re: RLS is still on unusual med combination for the following reason: Pramipexole only interfered with sleep. Ropinirole only was too fatiguing. OK to use combo if necessary as previously required. Ropinirole 2 mg PM with pramipexole 0.375 mg PM Stop afternoon caffeine.  Disc insomnia trazodone vs Sonata.    Disc her questions about ADD.  Need to address sleep problems first. Gave questionaire.  Push fluids.    Call if worsening sx  .25 min appt.  FU 3  mos.  Meredith Staggers, MD, DFAPA   Please see After Visit Summary for patient specific instructions.  Future Appointments  Date Time Provider Department Center  06/12/2020  7:50 AM [facility]  06/13/2020  9:00 AM Tolia, Sunit, DO [facility] None    No orders of the defined types were placed in this encounter.     -------------------------------

## 2020-06-11 NOTE — Progress Notes (Signed)
Date:  06/13/2020   ID:  Pamela Little, DOB 1970/07/21, MRN [id number]  PCP:  Donald Prose, MD  Cardiologist:  Rex Kras, DO, [facility] (established care 04/20/19/2022) Former Cardiology Providers: NA  Date: 06/13/20 Last Office Visit: 05/07/2020  Chief Complaint  Patient presents with  . Results  . Follow-up    HPI  Pamela Little is a 50 y.o. female who presents to the office with a chief complaint of " review test results." Patient's past medical history and cardiovascular risk factors include: History of COVID-19 infection, asthma, depression, insomnia, panic attacks, restless leg syndrome, postmenopausal, obesity.  She is referred to the office at the request of Donald Prose, MD for evaluation of racing heartbeat.  At the last office visit patient was concerned that she was having symptoms of palpitation for the last 2 years which have not changed in intensity, frequency, and duration.  And recently after playing tennis she has noticed that her heart rate has remained greater than 100 bpm even after the cessation of physical activity.  Therefore she presented to the office for further evaluation and management.  Since last office visit patient states that her palpitations and chest pain are still present but the intensity, frequency, duration have remained relatively stable and not progressive.  She is working with her psychiatrist reduce psychotropic medications.  And she is working on lifestyle modifications as well.  She is lost approximately 12 pounds since last office visit.  Patient has undergone an echocardiogram which notes preserved LVEF, grade 1 diastolic impairment, and no significant valvular heart disease.  Exercise stress test overall a low risk study.  Extended Holter monitor results reviewed which notes underlying rhythm to be normal sinus.  No significant dysrhythmias.  Patient has had 2 episodes of second-degree AV block while she was sleeping.  Patient states that at night  she has been told by family members that she snores excessively.  FUNCTIONAL STATUS: Regularly plays tennis for 2-3 hours twice a week.    ALLERGIES: No Known Allergies  MEDICATION LIST PRIOR TO VISIT: Current Meds  Medication Sig  . albuterol (VENTOLIN HFA) 108 (90 Base) MCG/ACT inhaler   . ALPRAZolam (XANAX) 0.5 MG tablet TAKE 1 TABLET BY MOUTH TWICE A DAY PLUS 2 AT BEDTIME AS NEEDED FOR SLEEP  . buPROPion (WELLBUTRIN XL) 150 MG 24 hr tablet Take 1 tablet (150 mg total) by mouth daily.  . montelukast (SINGULAIR) 10 MG tablet Take 10 mg by mouth daily.   . pramipexole (MIRAPEX) 0.125 MG tablet Take 3 tablets (0.375 mg total) by mouth every evening.  Marland Kitchen rOPINIRole (REQUIP) 1 MG tablet TAKE 3 TABLETS AT BEDTIME (Patient taking differently: Take by mouth. Per patient, 2 tablets at bedtime)  . sertraline (ZOLOFT) 100 MG tablet Take 1.5 tablets (150 mg total) by mouth daily.  . traZODone (DESYREL) 50 MG tablet Take 1-2 tablets (50-100 mg total) by mouth at bedtime.     PAST MEDICAL HISTORY: Past Medical History:  Diagnosis Date  . Asthma    with activity  . Depression   . Migraine 03/21/2014  . Panic attacks   . Restless legs syndrome 03/21/2014  . 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 Vertigo     PAST SURGICAL HISTORY: Past Surgical History:  Procedure Laterality Date  . ABLATION ON ENDOMETRIOSIS    . CESAREAN SECTION WITH BILATERAL TUBAL LIGATION    . MYRINGOTOMY WITH TUBE PLACEMENT    . TONSILLECTOMY      FAMILY HISTORY: The patient  family history includes Hypertension in her father and mother; Tremor in her sister.  SOCIAL HISTORY:  The patient  reports that she has never smoked. She has never used smokeless tobacco. She reports current alcohol use of about 1.0 standard drink of alcohol per week. She reports that she does not use drugs.  REVIEW OF SYSTEMS: Review of Systems  Constitutional: Positive for weight loss. Negative for chills, fever and weight gain.  HENT: Negative  for hoarse voice and nosebleeds.   Eyes: Negative for discharge, double vision and pain.  Cardiovascular: Positive for chest pain (stable) and palpitations. Negative for claudication, dyspnea on exertion, leg swelling, near-syncope, orthopnea, paroxysmal nocturnal dyspnea and syncope.  Respiratory: Negative for hemoptysis, shortness of breath and wheezing.   Musculoskeletal: Negative for muscle cramps and myalgias.  Gastrointestinal: Negative for abdominal pain, constipation, diarrhea, hematemesis, hematochezia, melena, nausea and vomiting.  Neurological: Positive for vertigo. Negative for dizziness and light-headedness.    PHYSICAL EXAM: Vitals with BMI 06/13/2020 05/07/2020 10/03/2019  Height 5' 8"  5' 8"  -  Weight 209 lbs 3 oz 221 lbs 13 oz -  BMI 10.96 04.54 -  Systolic 098 119 89  Diastolic 80 73 45  Pulse 80 66 61  Some encounter information is confidential and restricted. Go to Review Flowsheets activity to see all data.   CONSTITUTIONAL: Well-developed and well-nourished. No acute distress.  SKIN: Skin is warm and dry. No rash noted. No cyanosis. No pallor. No jaundice HEAD: Normocephalic and atraumatic.  EYES: No scleral icterus MOUTH/THROAT: Moist oral membranes.  NECK: No JVD present. No thyromegaly noted. No carotid bruits  LYMPHATIC: No visible cervical adenopathy.  CHEST Normal respiratory effort. No intercostal retractions  LUNGS: Clear to auscultation bilaterally.  No stridor. No wheezes. No rales.  CARDIOVASCULAR: Regular rate and rhythm, positive S1-S2, no murmurs rubs or gallops appreciated. ABDOMINAL: Obese, soft, nontender, nondistended, positive bowel sounds in all 4 quadrants. no apparent ascites.  EXTREMITIES: No peripheral edema  HEMATOLOGIC: No significant bruising NEUROLOGIC: Oriented to person, place, and time. Nonfocal. Normal muscle tone.  PSYCHIATRIC: Normal mood and affect. Normal behavior. Cooperative  CARDIAC DATABASE: EKG: 05/07/2020: Normal sinus  rhythm, 63 bpm, normal axis, without underlying ischemia or injury.    Echocardiogram: 05/22/2020:  Left ventricle cavity is normal in size and wall thickness.  Normal global wall motion.  Normal LV systolic function with EF 63%.  Doppler evidence of grade I (impaired) diastolic dysfunction, normal LAP. Mild pulmonic regurgitation.  No evidence of pulmonary hypertension.   Stress Testing: 05/21/2020: Exercise treadmill stress test performed using Bruce protocol.   Patient reached 7 METS, and 89% of age predicted maximum heart rate.   Exercise capacity was low.   No chest pain reported.  Normal heart rate and hemodynamic response.  Frequent PVC's seen in stage 1 of Bruce protocol.  Peak ECG demonstrated sinus tachycardia, no ST-T wave abnormalities.  Low risk study.  Heart Catheterization: None  7 day extended Holter monitor: Dominant rhythm normal sinus.  Heart rate 30-159 bpm.  Avg HR 71 bpm. No atrial fibrillation, supraventricular tachycardia, ventricular tachycardia, high grade AV block, pauses (3 seconds or longer). Two episodes of second degree AV block on 05/10/20 and 05/11/20 during early hours (5:47am and 5:46am respectively-asymptomatic).  Total ventricular ectopic burden <1%. Total supraventricular ectopic burden <1%. Patient triggered events: 4.  These did not correlate with any significant dysrhythmias.   LABORATORY DATA: CBC Latest Ref Rng & Units 06/18/2018  WBC 4.0 - 10.5 K/uL 12.4(H)  Hemoglobin  12.0 - 15.0 g/dL 13.4  Hematocrit 36.0 - 46.0 % 41.0  Platelets 150 - 400 K/uL 228    CMP Latest Ref Rng & Units 06/18/2018  Glucose 70 - 99 mg/dL 107(H)  BUN 6 - 20 mg/dL 16  Creatinine 0.44 - 1.00 mg/dL 0.96  Sodium 135 - 145 mmol/L 137  Potassium 3.5 - 5.1 mmol/L 3.4(L)  Chloride 98 - 111 mmol/L 104  CO2 22 - 32 mmol/L 24  Calcium 8.9 - 10.3 mg/dL 8.8(L)    Lipid Panel  No results found for: CHOL, TRIG, HDL, CHOLHDL, VLDL, LDLCALC, LDLDIRECT, LABVLDL  No  components found for: NTPROBNP No results for input(s): PROBNP in the last 8760 hours. No results for input(s): TSH in the last 8760 hours.  BMP No results for input(s): NA, K, CL, CO2, GLUCOSE, BUN, CREATININE, CALCIUM, GFRNONAA, GFRAA in the last 8760 hours.  HEMOGLOBIN A1C No results found for: HGBA1C, MPG  External Labs: Collected: 04/2020, provided by PCP Hemoglobin 12.4 g/dL, hematocrit 37.4% Creatinine 0.95 mg/dL. eGFR: 62 mL/min per 1.73 m Lipid profile: Total cholesterol 190, triglycerides 107, HDL 56, LDL 115, non-HDL 134 TSH: 1.98,  IMPRESSION:    ICD-10-CM   1. Tachycardia  R00.0   2. History of COVID-19  Z86.16   3. Precordial pain  R07.2   4. Class 1 obesity due to excess calories without serious comorbidity with body mass index (BMI) of 31.0 to 31.9 in adult  E66.09    Z68.31   5. Encounter to discuss test results  Z71.2      RECOMMENDATIONS: TYLEE YUM is a 50 y.o. female whose past medical history and cardiac risk factors include: History of COVID-19 infection, asthma, depression, insomnia, panic attacks, restless leg syndrome, postmenopausal, obesity.  Palpitations: Stable  EKG shows normal sinus rhythm without underlying injury pattern or ectopy.  No identifiable reversible causes.  TSH within normal limits.  Extended Holter monitor results reviewed.  Overall normal sinus rhythm without underlying dysrhythmias.  She had 2 episodes of asymptomatic second-degree AV block while she was asleep.  She has demonstrated excellent chronotropic competence during her exercise stress test.  No additional cardiovascular testing needed at this time.  However would recommend sleep study to rule out obstructive sleep apnea given her arrhythmia noted on extended Holter monitor.  Outside labs provided by PCP independently reviewed during today's encounter and summarized findings noted above.  Precordial pain:  Relatively stable, most likely noncardiac.  Ischemic  work-up overall unremarkable.  Continue to monitor.  Patient is educated on seeking medical attention if her symptoms increase in intensity, frequency, and/or duration.  In addition, she is more than welcome to come back for reevaluation and we could consider additional testing such as coronary CTA for further evaluation and management.  Patient is agreeable with the plan of care and would like to hold off additional testing at this time.    Shortness of breath: See above  Patient is currently working on improving her modifiable cardiovascular risk factors.  She is lost approximately 12 pounds since last office visit she is congratulated on her efforts.  I would like to monitor her symptoms clinically and reevaluate in 6 months to see if any additional testing or pharmacological therapy is warranted.  Patient agreeable with the plan of care.  FINAL MEDICATION LIST END OF ENCOUNTER: No orders of the defined types were placed in this encounter.   There are no discontinued medications.   Current Outpatient Medications:  .  albuterol (VENTOLIN  HFA) 108 (90 Base) MCG/ACT inhaler, , Disp: , Rfl:  .  ALPRAZolam (XANAX) 0.5 MG tablet, TAKE 1 TABLET BY MOUTH TWICE A DAY PLUS 2 AT BEDTIME AS NEEDED FOR SLEEP, Disp: 90 tablet, Rfl: 1 .  buPROPion (WELLBUTRIN XL) 150 MG 24 hr tablet, Take 1 tablet (150 mg total) by mouth daily., Disp: 90 tablet, Rfl: 0 .  montelukast (SINGULAIR) 10 MG tablet, Take 10 mg by mouth daily. , Disp: , Rfl:  .  pramipexole (MIRAPEX) 0.125 MG tablet, Take 3 tablets (0.375 mg total) by mouth every evening., Disp: 270 tablet, Rfl: 0 .  rOPINIRole (REQUIP) 1 MG tablet, TAKE 3 TABLETS AT BEDTIME (Patient taking differently: Take by mouth. Per patient, 2 tablets at bedtime), Disp: 270 tablet, Rfl: 3 .  sertraline (ZOLOFT) 100 MG tablet, Take 1.5 tablets (150 mg total) by mouth daily., Disp: 135 tablet, Rfl: 1 .  traZODone (DESYREL) 50 MG tablet, Take 1-2 tablets (50-100 mg total) by  mouth at bedtime., Disp: 180 tablet, Rfl: 0  No orders of the defined types were placed in this encounter.   There are no Patient Instructions on file for this visit.   --Continue cardiac medications as reconciled in final medication list. --Return in about 6 months (around 12/13/2020) for Follow up palpitation and cp re-evaluation. Or sooner if needed. --Continue follow-up with your primary care physician regarding the management of your other chronic comorbid conditions.  Patient's questions and concerns were addressed to her satisfaction. She voices understanding of the instructions provided during this encounter.   This note was created using a voice recognition software as a result there may be grammatical errors inadvertently enclosed that do not reflect the nature of this encounter. Every attempt is made to correct such errors.  Rex Kras, [place], [facility]  Pager: [phone] Office: [phone]

## 2020-06-12 ENCOUNTER — Ambulatory Visit
Admission: RE | Admit: 2020-06-12 | Discharge: 2020-06-12 | Disposition: A | Discharge status: Home / Self Care | Payer: 59 | Source: Ambulatory Visit | Attending: Family Medicine | Admitting: Family Medicine

## 2020-06-12 ENCOUNTER — Other Ambulatory Visit: Payer: Self-pay

## 2020-06-12 DIAGNOSIS — Z1231 Encounter for screening mammogram for malignant neoplasm of breast: Secondary | ICD-10-CM

## 2020-06-13 ENCOUNTER — Encounter: Payer: Self-pay | Admitting: Cardiology

## 2020-06-13 ENCOUNTER — Other Ambulatory Visit: Payer: Self-pay

## 2020-06-13 ENCOUNTER — Ambulatory Visit: Payer: 59 | Admitting: Cardiology

## 2020-06-13 VITALS — BP 125/80 | HR 80 | Temp 97.9°F | Resp 16 | Ht 68.0 in | Wt 209.2 lb

## 2020-06-13 DIAGNOSIS — Z8616 Personal history of COVID-19: Secondary | ICD-10-CM

## 2020-06-13 DIAGNOSIS — R072 Precordial pain: Secondary | ICD-10-CM

## 2020-06-13 DIAGNOSIS — R000 Tachycardia, unspecified: Secondary | ICD-10-CM

## 2020-06-13 DIAGNOSIS — E6609 Other obesity due to excess calories: Secondary | ICD-10-CM

## 2020-06-13 DIAGNOSIS — R0602 Shortness of breath: Secondary | ICD-10-CM

## 2020-06-13 DIAGNOSIS — Z6831 Body mass index (BMI) 31.0-31.9, adult: Secondary | ICD-10-CM

## 2020-06-13 DIAGNOSIS — Z712 Person consulting for explanation of examination or test findings: Secondary | ICD-10-CM

## 2020-06-28 ENCOUNTER — Other Ambulatory Visit: Payer: Self-pay | Admitting: Psychiatry

## 2020-06-28 DIAGNOSIS — F4001 Agoraphobia with panic disorder: Secondary | ICD-10-CM

## 2020-06-28 DIAGNOSIS — F411 Generalized anxiety disorder: Secondary | ICD-10-CM

## 2020-06-29 ENCOUNTER — Other Ambulatory Visit: Payer: Self-pay | Admitting: Psychiatry

## 2020-06-29 NOTE — Telephone Encounter (Signed)
Please review

## 2020-06-30 ENCOUNTER — Other Ambulatory Visit: Payer: Self-pay | Admitting: Psychiatry

## 2020-06-30 DIAGNOSIS — F5105 Insomnia due to other mental disorder: Secondary | ICD-10-CM

## 2020-08-16 ENCOUNTER — Other Ambulatory Visit: Payer: Self-pay | Admitting: Psychiatry

## 2020-08-16 DIAGNOSIS — F5105 Insomnia due to other mental disorder: Secondary | ICD-10-CM

## 2020-08-16 DIAGNOSIS — G2581 Restless legs syndrome: Secondary | ICD-10-CM

## 2020-09-06 ENCOUNTER — Other Ambulatory Visit: Payer: Self-pay | Admitting: Psychiatry

## 2020-10-08 ENCOUNTER — Ambulatory Visit: Payer: 59 | Admitting: Psychiatry

## 2020-11-14 ENCOUNTER — Other Ambulatory Visit: Payer: Self-pay | Admitting: Psychiatry

## 2020-11-14 DIAGNOSIS — F4001 Agoraphobia with panic disorder: Secondary | ICD-10-CM

## 2020-11-14 DIAGNOSIS — G2581 Restless legs syndrome: Secondary | ICD-10-CM

## 2020-11-14 DIAGNOSIS — F5105 Insomnia due to other mental disorder: Secondary | ICD-10-CM

## 2020-11-14 DIAGNOSIS — F411 Generalized anxiety disorder: Secondary | ICD-10-CM

## 2020-12-07 ENCOUNTER — Telehealth: Payer: Self-pay | Admitting: Psychiatry

## 2020-12-07 NOTE — Telephone Encounter (Signed)
Pt called and left a message stating that she would like to go back on ropinrole 1 mg 2 or 3 at night. Next appt is 10/26. [facility]

## 2020-12-07 NOTE — Telephone Encounter (Signed)
Please call pt and find out when she stopped taking ropinirole.The last note stated she was taking Ropinirole 2 mg PM with pramipexole 0.375 mg PM.Did she just want to reduce the dose,or did she stop taking the med completely and is asking to restart?

## 2020-12-07 NOTE — Telephone Encounter (Signed)
LVM to [facility] 

## 2020-12-12 ENCOUNTER — Encounter: Payer: Self-pay | Admitting: Psychiatry

## 2020-12-12 ENCOUNTER — Ambulatory Visit (INDEPENDENT_AMBULATORY_CARE_PROVIDER_SITE_OTHER): Payer: 59 | Admitting: Psychiatry

## 2020-12-12 ENCOUNTER — Other Ambulatory Visit: Payer: Self-pay

## 2020-12-12 DIAGNOSIS — G2581 Restless legs syndrome: Secondary | ICD-10-CM | POA: Diagnosis not present

## 2020-12-12 DIAGNOSIS — G250 Essential tremor: Secondary | ICD-10-CM

## 2020-12-12 DIAGNOSIS — F5105 Insomnia due to other mental disorder: Secondary | ICD-10-CM

## 2020-12-12 DIAGNOSIS — F902 Attention-deficit hyperactivity disorder, combined type: Secondary | ICD-10-CM

## 2020-12-12 DIAGNOSIS — F4001 Agoraphobia with panic disorder: Secondary | ICD-10-CM | POA: Diagnosis not present

## 2020-12-12 DIAGNOSIS — F411 Generalized anxiety disorder: Secondary | ICD-10-CM | POA: Diagnosis not present

## 2020-12-12 MED ORDER — METHYLPHENIDATE HCL ER (OSM) 36 MG PO TBCR: 36.0000 mg | EXTENDED_RELEASE_TABLET | Freq: Every day | ORAL | 0 refills | Status: DC

## 2020-12-12 MED ORDER — ROPINIROLE HCL 1 MG PO TABS: 3.0000 mg | ORAL_TABLET | Freq: Every day | ORAL | 1 refills | Status: DC

## 2020-12-12 NOTE — Progress Notes (Signed)
Pamela Little [id number] December 31, 1970 50 y.o.   Subjective:   Patient ID:  Pamela Little is a 50 y.o. (DOB 02/21/1970) female.  Chief Complaint:  Chief Complaint  Patient presents with   Follow-up   Anxiety   Sleeping Problem   Depression    Anxiety Symptoms include nervous/anxious behavior and palpitations. Patient reports no chest pain, confusion or decreased concentration.    Medication Refill Pertinent negatives include no chest pain, congestion or weakness.  Pamela Little presents to the office today for follow-up ofweight concerns and pharmacy problems and FU anxiety.  When seen May 2020.  She was having insomnia as we increased the Xanax to 0.5 mg twice daily and 1 mg nightly.  She continued Wellbutrin XL 300 mg every morning, sertraline 150, ropinirole for restless legs, propranolol as needed for anxiety, naltrexone for weight gain. Also taking pramipexole ER.     seen September.  Following change: DT insomnia increase Xanax 0.5 mg BID and 1mg  HS.  Last seen February 16, 2019.  The following was noted: Hanging in there.  Quit taking Naltrexone and pramipexole ER 3mg  .  After tennis would feel unusually fatigued and like she might pass out. More RLS in the afternoon without the pramipexole  The following medical decisions were made: Re: RLS is still on unusual med combination for the following reason: Pramipexole only interfered with sleep.  So stopped. Ropinirole only was too fatiguing. So taking the mixture of the 2 for severe RLS Failed iron trials for RLS Trial gabapentin 300 mg 2 twice daily for RLS.  04/2019 appt noted: She does not feel the gabapentin does much so still taking the ropinirole. No SE.   Milder RLS.   Since the higher dosage of Wellbutrin did not help with weight will leave at 300mg  daily.  May retry higherdose once son is placed.  Consider phentermine. Has been told she's in menopause and is having sweating and hot flashes.  Sleep comes and goes  and is not consistent.  Overall mentally OK for the most part.  Definitely anxious.  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  Not depressed.   Can be chronically tired.  Always upset with her weight.  Working normal 40 + hours.  Chronically stressed. Has been a little anxious and been stressed with H sick with leukemia.  Son 6 yo still driving them nuts and not doing school.  Still struggling big time with him.  That will make her weepy.     Definitely anxious.  Struggles with weight.  Plans to see GYN  Trouble staying asleep and 5 hours average, not enough.  Some awakening with anxiety and worrying over son being out and whether he's safe.  Typical Xanax 0.5mg  TID Took Wellb 450 am for awhile and did not lose weight or see any benefit or SE.  Emotional eater and stressed by son's mental health and substance abuse problems and refusing school.  Very angry and defiant.  Anticipates placement somewhere.  Drinking and pot.  Chronically anxious.  Hard on the whole family.  Refusing church.  Pt doesn't think changing her med will help.  I keep eating.  Not ususal for her.  Not dep but upset with her son.  No severe panic attacks.  Best very frustrated with her binge eating at this time but feels the stress at home is precipitating it.  Switched Ativan to Xanax and been OK with that.  Not sure re: differences except sleeps  better ususally.    Tremor manageable. Plan:   Change to 1 mg Xanax DT QL.   Re: RLS is still on unusual med combination for the following reason: Pramipexole only interfered with sleep. So stopped. Ropinirole only was too fatiguing.  Failed iron trials for RLS Trial higher gabapentin 600 mg 2 twice daily for RLS.  Might also help anxiety. Caffeine 1 diet Coke or less.    11/24/19 appt noted: A lot of stress.  2 panic attacks.  Never been so busy and stressed as now.  Menopause.  Mind racing at night and not resting well. Had stopped naltrexone and ER pramipexole bc felt it  was sedating. RLS is still a problem and reduced ropinirole and pramipexole to deal with sedative SE. Wishes she slept better.  CO EMA usually not due to RLS.  Feels RLS more in hands. Sleep 4-5 hours average. Tries to do too much and trouble finishing things.  Son has ADD and wonders if that's part of her problem. Plan: no med changes  06/05/2020 appointment with the following noted: Doing OK. Covid December 2021.  Can still struggle with RLS in legs and hands. Not taking propranolol much. Asks to increase pramipexole for RLS to  0.375 mg in addition to Ropinirole 2 mg PM. Limited to 1 Coke Zero as late as late afternoon. Wonders about ADD. Not binge eating. History palpitations with cardiology appt next week. Some tremor and wonders about reducing Wellbutrin bc no problems with depression lately. Considering ADD tx.  Defer. Anxiety manageable. Plan: Re: RLS is still on unusual med combination for the following reason: Pramipexole only interfered with sleep. Ropinirole only was too fatiguing. OK to use combo if necessary as previously required. Ropinirole 2 mg PM with pramipexole 0.375 mg PM Stop afternoon caffeine. Disc insomnia trazodone vs Sonata.   Reduce Wellbutrin to see if tremor is better.  12/12/20 appt noted: Caffeine contributes to tremor.  Not sure problem with less Wellbutrin.  Always mild depression. Still active. Above dosing and still had terrrible RLS and trouble sleeping.  With Ropinirole alone it helped some.  Spreading out ropinirole helped.  Pramipexole didn't help. No SE except some fatigue.  Split ropinirole helped that and trazodone helped. Rare anxiety in middle of sports activity.  Cleared by cardiology.  Anxiety managed. Still wonders about ADD bc trouble finishing things.  So many things she doesn't finish.  Frustrated.  H notices.  Wants to try ADD tx. Feels needs AM Xanax bc gets demands of day stressed.  Stress H with Leukemia and recent  hospitalization for heart problems.  Past Psychiatric Medication Trials:  Fluoxetine, Wellbutrin 450 no wt loss,  venlafaxine, sertraline 300, Lexapro 30, fluvoxamine, buspirone,  clonazepam, lorazepam, Xanax 0.5 Abilify,  Vistaril, propranolol, Mirapex, naltrexone,  topiramate 200 fatigue, Pramipexole only interfered with sleep. Pramipexole ER Ropinirole only was too fatiguing. So taking the mixture of the 2 for severe RLS Failed iron trials for RLS Gabapentin 600 Naltrexone excessive weakness.  Review of Systems:  Review of Systems  HENT:  Negative for congestion.   Cardiovascular:  Positive for palpitations. Negative for chest pain.  Neurological:  Negative for tremors and weakness.  Psychiatric/Behavioral:  Positive for sleep disturbance. Negative for agitation, behavioral problems, confusion, decreased concentration, dysphoric mood, hallucinations and self-injury. The patient is nervous/anxious. The patient is not hyperactive.    Medications: I have reviewed the patient's current medications.  Current Outpatient Medications  Medication Sig Dispense Refill   albuterol (VENTOLIN HFA) 108 (  90 Base) MCG/ACT inhaler      ALPRAZolam (XANAX) 0.5 MG tablet TAKE 1 TABLET BY MOUTH TWICE A DAY PLUS 2 AT BEDTIME AS NEEDED FOR SLEEP (Patient taking differently: 1 in AM and 1 HS) 90 tablet 5   buPROPion (WELLBUTRIN XL) 150 MG 24 hr tablet TAKE 1 TABLET DAILY 90 tablet 0   methylphenidate (CONCERTA) 36 MG PO CR tablet Take 1 tablet (36 mg total) by mouth daily. 30 tablet 0   montelukast (SINGULAIR) 10 MG tablet Take 10 mg by mouth daily.      sertraline (ZOLOFT) 100 MG tablet TAKE ONE AND ONE-HALF TABLETS DAILY (Patient taking differently: 100 mg.) 135 tablet 0   traZODone (DESYREL) 50 MG tablet TAKE 1 TO 2 TABLETS AT BEDTIME 180 tablet 0   rOPINIRole (REQUIP) 1 MG tablet Take 3 tablets (3 mg total) by mouth at bedtime. 270 tablet 1   No current facility-administered medications for this  visit.    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   Vertigo     Family History  Problem Relation Age of Onset   Hypertension Mother    Hypertension Father    Tremor Sister    Heart attack Neg Hx    Diabetes Neg Hx    Colon cancer Neg Hx    Colon polyps Neg Hx    Stomach cancer Neg Hx    Esophageal cancer Neg Hx     Social History   Socioeconomic History   Marital status: Married    Spouse name: Not on file   Number of children: 3   Years of education: college   Highest education level: Not on file  Occupational History   Not on file  Tobacco Use   Smoking status: Never   Smokeless tobacco: Never  Vaping Use   Vaping Use: Never used  Substance and Sexual Activity   Alcohol use: Yes    Alcohol/week: 1.0 standard drink    Types: 1 Cans of beer per week    Comment: occasionaly   Drug use: Never   Sexual activity: Not on file  Other Topics Concern   Not on file  Social History Narrative   Patient is right handed.   Patient drinks 1 cup caffeine daily.   Social Determinants of Health   Financial Resource Strain: Not on file  Food Insecurity: Not on file  Transportation Needs: Not on file  Physical Activity: Not on file  Stress: Not on file  Social Connections: Not on file  Intimate Partner Violence: Not on file    Past Medical History, Surgical history, Social history, and Family history were reviewed and updated as appropriate.   Please see review of systems for further details on the patient's review from today.   Objective:   Physical Exam:  There were no vitals taken for this visit.  Physical Exam Constitutional:      General: She is not in acute distress. Musculoskeletal:        General: No deformity.  Neurological:     Mental Status:  She is alert and oriented to person, place, and time.     Cranial Nerves: No dysarthria.     Coordination: Coordination normal.  Psychiatric:  Attention and Perception: Perception normal. She is inattentive. She does not perceive auditory or visual hallucinations.        Mood and Affect: Mood is anxious and depressed. Affect is not labile, blunt, angry or inappropriate.        Speech: Speech normal. Speech is not rapid and pressured.        Behavior: Behavior normal. Behavior is cooperative.        Thought Content: Thought content normal. Thought content is not paranoid or delusional. Thought content does not include homicidal or suicidal ideation. Thought content does not include homicidal or suicidal plan.        Cognition and Memory: Cognition and memory normal.        Judgment: Judgment normal.     Comments: Insight pretty good. She is chronically anxious it is never been completely controlled but is better Residual depression    Lab Review:     Component Value Date/Time   NA 137 06/18/2018 1901   K 3.4 (L) 06/18/2018 1901   CL 104 06/18/2018 1901   CO2 24 06/18/2018 1901   GLUCOSE 107 (H) 06/18/2018 1901   BUN 16 06/18/2018 1901   CREATININE 0.96 06/18/2018 1901   CALCIUM 8.8 (L) 06/18/2018 1901   GFRNONAA >60 06/18/2018 1901   GFRAA >60 06/18/2018 1901       Component Value Date/Time   WBC 12.4 (H) 06/18/2018 1901   RBC 4.43 06/18/2018 1901   HGB 13.4 06/18/2018 1901   HCT 41.0 06/18/2018 1901   PLT 228 06/18/2018 1901   MCV 92.6 06/18/2018 1901   MCH 30.2 06/18/2018 1901   MCHC 32.7 06/18/2018 1901   RDW 13.4 06/18/2018 1901    No results found for: POCLITH, LITHIUM   No results found for: PHENYTOIN, PHENOBARB, VALPROATE, CBMZ   .res Assessment: Plan:    Generalized anxiety disorder  Attention deficit hyperactivity disorder (ADHD), combined type - Plan: methylphenidate (CONCERTA) 36 MG PO CR tablet  Panic disorder with agoraphobia  Restless legs  syndrome - Plan: rOPINIRole (REQUIP) 1 MG tablet  Insomnia due to mental condition  Tremor, essential    Disc various dx and status of current med regimen.  depression and anxiety are managed.  RLS is not.  Wonders about ADD. Binge eating managed.  Wellbutrin XL to 150 mg AM to see if tremor is better.   Change to 1 mg Xanax DT QL.  We discussed the short-term risks associated with benzodiazepines including sedation and increased fall risk among others.  Discussed long-term side effect risk including dependence, potential withdrawal symptoms, and the potential eventual dose-related risk of dementia. Disc new studies refuting the link.   Pramipexole not very effective.  Tolerating ropinirole better by splitting the dosage. Ropinirole 2 mg PM  Stop afternoon caffeine.  Disc insomnia trazodone helpful with Xanax 0.5  Disc her questions about ADD.  High inattentive score on questionaire. Push fluids.   Wean AM Xanax and start concerta for ADD 36 mg AM Discussed potential benefits, risks, and side effects of stimulants with patient to include increased heart rate, palpitations, insomnia, increased anxiety, increased irritability, or decreased appetite.  Instructed patient to contact office if experiencing any significant tolerability issues.  Call if worsening sx  .25 min appt.  FU 2 mos.  Meredith Staggers, MD, DFAPA   Please see After Visit Summary for patient specific instructions.  Future Appointments  Date Time Provider Department Center  12/13/2020 10:15 AM Tessa Lerner, DO [facility] None  No orders of the defined types were placed in this encounter.     -------------------------------

## 2020-12-13 ENCOUNTER — Ambulatory Visit: Payer: Self-pay | Admitting: Cardiology

## 2020-12-13 ENCOUNTER — Encounter: Payer: Self-pay | Admitting: Cardiology

## 2020-12-13 VITALS — BP 122/85 | HR 58 | Temp 97.5°F | Resp 16 | Ht 68.0 in | Wt 211.4 lb

## 2020-12-13 DIAGNOSIS — E6609 Other obesity due to excess calories: Secondary | ICD-10-CM

## 2020-12-13 DIAGNOSIS — R0683 Snoring: Secondary | ICD-10-CM

## 2020-12-13 DIAGNOSIS — R072 Precordial pain: Secondary | ICD-10-CM

## 2020-12-13 DIAGNOSIS — Z8616 Personal history of COVID-19: Secondary | ICD-10-CM

## 2020-12-13 DIAGNOSIS — R002 Palpitations: Secondary | ICD-10-CM

## 2020-12-13 DIAGNOSIS — Z6832 Body mass index (BMI) 32.0-32.9, adult: Secondary | ICD-10-CM

## 2020-12-13 NOTE — Progress Notes (Signed)
 Date:  12/13/2020   ID:  Pamela Little, DOB 02/11/1971, MRN [id number]  PCP:  Sun, Vyvyan, MD  Cardiologist:  Sunit Tolia, DO, FACC (established care 04/20/19/2022) Former Cardiology Providers: NA  Date: 12/13/20 Last Office Visit: 06/13/2020   Chief Complaint  Patient presents with   Palpitations   Chest Pain   Follow-up    HPI  Pamela Little is a 50 y.o. female who presents to the office with a chief complaint of " re-evaluation of chest pain and palpitations." Patient's past medical history and cardiovascular risk factors include: History of COVID-19 infection, asthma, depression, insomnia, panic attacks, restless leg syndrome, postmenopausal, obesity.  She is referred to the office at the request of Sun, Vyvyan, MD for evaluation of racing heartbeat.  Patient has had palpitations for the last 2 years which have remained relatively stable with no change in intensity, frequency, duration.  She has undergone an echocardiogram which noted preserved LVEF, grade 1 diastolic impairment, no significant valvular heart disease.  Her exercise treadmill stress test was also considered to be low risk.  An extended Holter monitor results reviewed no significant dysrhythmias.  She now presents for 6-month follow-up.  Patient states that she continues to have palpitations and precordial pain which is chronic and stable.  As mentioned above no change in intensity, frequency, and/or duration.  The precordial pain is located on the left side, occurs once or twice a week, lasting for 60 seconds or less, self-limited, not brought on by effort related activities and does not resolve with rest.  No change in overall functional status with regards to playing tennis.  And more noticeable while she is either driving or sitting at her desk.  Since last office visit patient has been working with her providers and has reduced the doses of Zoloft and Wellbutrin.  And has been started on Concerta  today.  FUNCTIONAL STATUS: Regularly plays tennis for 2-3 hours twice a week.    ALLERGIES: No Known Allergies  MEDICATION LIST PRIOR TO VISIT: Current Meds  Medication Sig   albuterol (VENTOLIN HFA) 108 (90 Base) MCG/ACT inhaler    ALPRAZolam (XANAX) 0.5 MG tablet TAKE 1 TABLET BY MOUTH TWICE A DAY PLUS 2 AT BEDTIME AS NEEDED FOR SLEEP (Patient taking differently: 1 in AM and 1 HS)   buPROPion (WELLBUTRIN XL) 150 MG 24 hr tablet TAKE 1 TABLET DAILY   methylphenidate (CONCERTA) 36 MG PO CR tablet Take 1 tablet (36 mg total) by mouth daily.   montelukast (SINGULAIR) 10 MG tablet Take 10 mg by mouth daily.    rOPINIRole (REQUIP) 1 MG tablet Take 3 tablets (3 mg total) by mouth at bedtime.   sertraline (ZOLOFT) 100 MG tablet TAKE ONE AND ONE-HALF TABLETS DAILY (Patient taking differently: 100 mg.)   traZODone (DESYREL) 50 MG tablet TAKE 1 TO 2 TABLETS AT BEDTIME     PAST MEDICAL HISTORY: Past Medical History:  Diagnosis Date   Asthma    with activity   Depression    Migraine 03/21/2014   Panic attacks    Restless legs syndrome 03/21/2014   Tremor, essential 03/21/2014   Vertigo     PAST SURGICAL HISTORY: Past Surgical History:  Procedure Laterality Date   ABLATION ON ENDOMETRIOSIS     CESAREAN SECTION WITH BILATERAL TUBAL LIGATION     MYRINGOTOMY WITH TUBE PLACEMENT     TONSILLECTOMY      FAMILY HISTORY: The patient family history includes Hypertension in her father and mother; Tremor   in her sister.  SOCIAL HISTORY:  The patient  reports that she has never smoked. She has never used smokeless tobacco. She reports current alcohol use of about 1.0 standard drink per week. She reports that she does not use drugs.  REVIEW OF SYSTEMS: Review of Systems  Constitutional: Negative for chills, fever, weight gain and weight loss.  HENT:  Negative for hoarse voice and nosebleeds.   Eyes:  Negative for discharge, double vision and pain.  Cardiovascular:  Positive for chest pain  (stable) and palpitations. Negative for claudication, dyspnea on exertion, leg swelling, near-syncope, orthopnea, paroxysmal nocturnal dyspnea and syncope.  Respiratory:  Positive for snoring. Negative for hemoptysis, shortness of breath and wheezing.   Musculoskeletal:  Negative for muscle cramps and myalgias.  Gastrointestinal:  Negative for abdominal pain, constipation, diarrhea, hematemesis, hematochezia, melena, nausea and vomiting.  Neurological:  Negative for dizziness and light-headedness.   PHYSICAL EXAM: Vitals with BMI 12/13/2020 06/13/2020 05/07/2020  Height 5' 8" 5' 8" 5' 8"  Weight 211 lbs 6 oz 209 lbs 3 oz 221 lbs 13 oz  BMI 32.15 09.62 83.66  Systolic 294 765 465  Diastolic 85 80 73  Pulse 58 80 66  Some encounter information is confidential and restricted. Go to Review Flowsheets activity to see all data.   CONSTITUTIONAL: Well-developed and well-nourished. No acute distress.  SKIN: Skin is warm and dry. No rash noted. No cyanosis. No pallor. No jaundice HEAD: Normocephalic and atraumatic.  EYES: No scleral icterus MOUTH/THROAT: Moist oral membranes.  NECK: No JVD present. No thyromegaly noted. No carotid bruits  LYMPHATIC: No visible cervical adenopathy.  CHEST Normal respiratory effort. No intercostal retractions  LUNGS: Clear to auscultation bilaterally.  No stridor. No wheezes. No rales.  CARDIOVASCULAR: Regular rate and rhythm, positive S1-S2, no murmurs rubs or gallops appreciated. ABDOMINAL: Obese, soft, nontender, nondistended, positive bowel sounds in all 4 quadrants. no apparent ascites.  EXTREMITIES: No peripheral edema  HEMATOLOGIC: No significant bruising NEUROLOGIC: Oriented to person, place, and time. Nonfocal. Normal muscle tone.  PSYCHIATRIC: Normal mood and affect. Normal behavior. Cooperative  CARDIAC DATABASE: EKG: 12/13/2020: Sinus bradycardia, 55 bpm, normal axis, without underlying ischemia or injury pattern.  Echocardiogram: 05/22/2020:   Left ventricle cavity is normal in size and wall thickness.  Normal global wall motion.  Normal LV systolic function with EF 63%.  Doppler evidence of grade I (impaired) diastolic dysfunction, normal LAP. Mild pulmonic regurgitation.  No evidence of pulmonary hypertension.   Stress Testing: 05/21/2020: Exercise treadmill stress test performed using Bruce protocol.   Patient reached 7 METS, and 89% of age predicted maximum heart rate.   Exercise capacity was low.   No chest pain reported.  Normal heart rate and hemodynamic response.  Frequent PVC's seen in stage 1 of Bruce protocol.  Peak ECG demonstrated sinus tachycardia, no ST-T wave abnormalities.  Low risk study.  Heart Catheterization: None  7 day extended Holter monitor: Dominant rhythm normal sinus.  Heart rate 30-159 bpm.  Avg HR 71 bpm. No atrial fibrillation, supraventricular or ventricular tachycardia, high grade AV block, pauses (3 seconds or longer). Two episodes of second degree AV block on 05/10/20 and 05/11/20 during early hours (5:47am and 5:46am respectively-asymptomatic).  Total ventricular ectopic burden <1%. Total supraventricular ectopic burden <1%. Patient triggered events: 4.  These did not correlate with any significant dysrhythmias.   LABORATORY DATA: CBC Latest Ref Rng & Units 06/18/2018  WBC 4.0 - 10.5 K/uL 12.4(H)  Hemoglobin 12.0 - 15.0 g/dL 13.4  Hematocrit 36.0 - 46.0 % 41.0  Platelets 150 - 400 K/uL 228    CMP Latest Ref Rng & Units 06/18/2018  Glucose 70 - 99 mg/dL 107(H)  BUN 6 - 20 mg/dL 16  Creatinine 0.44 - 1.00 mg/dL 0.96  Sodium 135 - 145 mmol/L 137  Potassium 3.5 - 5.1 mmol/L 3.4(L)  Chloride 98 - 111 mmol/L 104  CO2 22 - 32 mmol/L 24  Calcium 8.9 - 10.3 mg/dL 8.8(L)    Lipid Panel  No results found for: CHOL, TRIG, HDL, CHOLHDL, VLDL, LDLCALC, LDLDIRECT, LABVLDL  No components found for: NTPROBNP No results for input(s): PROBNP in the last 8760 hours. No results for input(s):  TSH in the last 8760 hours.  BMP No results for input(s): NA, K, CL, CO2, GLUCOSE, BUN, CREATININE, CALCIUM, GFRNONAA, GFRAA in the last 8760 hours.  HEMOGLOBIN A1C No results found for: HGBA1C, MPG  External Labs: Collected: 04/2020, provided by PCP Hemoglobin 12.4 g/dL, hematocrit 37.4% Creatinine 0.95 mg/dL. eGFR: 62 mL/min per 1.73 m Lipid profile: Total cholesterol 190, triglycerides 107, HDL 56, LDL 115, non-HDL 134 TSH: 1.98,  IMPRESSION:    ICD-10-CM   1. Palpitations  R00.2 EKG 12-Lead    2. Precordial pain  R07.2     3. Snoring  R06.83 Ambulatory referral to Sleep Studies    4. History of COVID-19  Z86.16     5. Class 1 obesity due to excess calories without serious comorbidity with body mass index (BMI) of 32.0 to 32.9 in adult  E66.09    Z68.32        RECOMMENDATIONS: Raejean B Priola is a 50 y.o. female whose past medical history and cardiac risk factors include: History of COVID-19 infection, asthma, depression, insomnia, panic attacks, restless leg syndrome, postmenopausal, obesity.  Palpitations:  Chronic and stable. ECG nonischemic. Echocardiogram: Preserved LVEF, grade 1 diastolic dysfunction, no significant valvular heart disease. Stress test: GXT was noted to be low risk study Extended Holter monitor results reviewed. We discussed considering pharmacological therapy such as beta-blockers or calcium channel blockers to see if her symptoms would improve; however, she would like to avoid additional medications if possible. No near-syncope or syncopal events. Since there has been no change with regards to intensity, frequency, and/o or duration would recommend monitoring symptoms.  Patient is agreeable with the plan of care.  Precordial pain: Precordial pain suggestive of noncardiac etiology EKG nonischemic Ischemic work-up as outlined above. We discussed additional testing such as coronary CTA versus exercise nuclear stress test.  After discussing the  risks, benefits, and limitations she would like to hold off on additional testing at this time.   Monitor for now  If the symptoms increase in intensity, frequency, and/or duration or has typical chest pain she is asked to go to the closest ER via EMS for further evaluation and management.    Snoring: 1 patient had an extended Holter monitor to evaluate for palpitations she was noted to have episodes of asymptomatic second-degree AV block while asleep.  She also complains of snoring on a daily basis which affects her husband regularly.  Patient is asked to be evaluated for sleep apnea and would like a referral.  I will refer the patient to Dr. James Osborne for further evaluation and management.  FINAL MEDICATION LIST END OF ENCOUNTER: No orders of the defined types were placed in this encounter.   There are no discontinued medications.   Current Outpatient Medications:    albuterol (VENTOLIN HFA) 108 (90 Base)   MCG/ACT inhaler, , Disp: , Rfl:    ALPRAZolam (XANAX) 0.5 MG tablet, TAKE 1 TABLET BY MOUTH TWICE A DAY PLUS 2 AT BEDTIME AS NEEDED FOR SLEEP (Patient taking differently: 1 in AM and 1 HS), Disp: 90 tablet, Rfl: 5   buPROPion (WELLBUTRIN XL) 150 MG 24 hr tablet, TAKE 1 TABLET DAILY, Disp: 90 tablet, Rfl: 0   methylphenidate (CONCERTA) 36 MG PO CR tablet, Take 1 tablet (36 mg total) by mouth daily., Disp: 30 tablet, Rfl: 0   montelukast (SINGULAIR) 10 MG tablet, Take 10 mg by mouth daily. , Disp: , Rfl:    rOPINIRole (REQUIP) 1 MG tablet, Take 3 tablets (3 mg total) by mouth at bedtime., Disp: 270 tablet, Rfl: 1   sertraline (ZOLOFT) 100 MG tablet, TAKE ONE AND ONE-HALF TABLETS DAILY (Patient taking differently: 100 mg.), Disp: 135 tablet, Rfl: 0   traZODone (DESYREL) 50 MG tablet, TAKE 1 TO 2 TABLETS AT BEDTIME, Disp: 180 tablet, Rfl: 0  Orders Placed This Encounter  Procedures   Ambulatory referral to Sleep Studies   EKG 12-Lead    There are no Patient Instructions on file for  this visit.   --Continue cardiac medications as reconciled in final medication list. --Return in about 6 months (around 06/13/2021) for Palpitations, Chest pain. Or sooner if needed. --Continue follow-up with your primary care physician regarding the management of your other chronic comorbid conditions.  Patient's questions and concerns were addressed to her satisfaction. She voices understanding of the instructions provided during this encounter.   This note was created using a voice recognition software as a result there may be grammatical errors inadvertently enclosed that do not reflect the nature of this encounter. Every attempt is made to correct such errors.  Rex Kras, [place], [facility]  Pager: [phone] Office: [phone]

## 2021-01-24 ENCOUNTER — Other Ambulatory Visit: Payer: Self-pay | Admitting: Psychiatry

## 2021-01-24 DIAGNOSIS — F902 Attention-deficit hyperactivity disorder, combined type: Secondary | ICD-10-CM

## 2021-01-24 MED ORDER — BUPROPION HCL ER (XL) 150 MG PO TB24: 150.0000 mg | ORAL_TABLET | Freq: Every day | ORAL | 0 refills | Status: DC

## 2021-01-24 MED ORDER — METHYLPHENIDATE HCL ER (OSM) 36 MG PO TBCR
36.0000 mg | EXTENDED_RELEASE_TABLET | Freq: Every day | ORAL | 0 refills | Status: DC
Start: 2021-01-24 — End: 2021-02-19

## 2021-01-24 MED ORDER — ALPRAZOLAM 0.5 MG PO TABS: ORAL_TABLET | ORAL | 0 refills | Status: DC

## 2021-01-24 NOTE — Telephone Encounter (Signed)
Pt has an appt 1/3

## 2021-01-24 NOTE — Telephone Encounter (Signed)
Call to RS

## 2021-01-24 NOTE — Telephone Encounter (Signed)
Pt LVM requesting refill of Concerta to [facility].  No upcoming appt.

## 2021-02-12 ENCOUNTER — Other Ambulatory Visit: Payer: Self-pay | Admitting: Psychiatry

## 2021-02-12 DIAGNOSIS — F4001 Agoraphobia with panic disorder: Secondary | ICD-10-CM

## 2021-02-12 DIAGNOSIS — F411 Generalized anxiety disorder: Secondary | ICD-10-CM

## 2021-02-12 DIAGNOSIS — F5105 Insomnia due to other mental disorder: Secondary | ICD-10-CM

## 2021-02-13 NOTE — Telephone Encounter (Signed)
90 day ok?

## 2021-02-13 NOTE — Telephone Encounter (Signed)
Appt on 1/3

## 2021-02-19 ENCOUNTER — Encounter: Payer: Self-pay | Admitting: Psychiatry

## 2021-02-19 ENCOUNTER — Other Ambulatory Visit: Payer: Self-pay

## 2021-02-19 ENCOUNTER — Ambulatory Visit (INDEPENDENT_AMBULATORY_CARE_PROVIDER_SITE_OTHER): Payer: 59 | Admitting: Psychiatry

## 2021-02-19 VITALS — BP 122/78 | HR 62

## 2021-02-19 DIAGNOSIS — F4001 Agoraphobia with panic disorder: Secondary | ICD-10-CM

## 2021-02-19 DIAGNOSIS — G2581 Restless legs syndrome: Secondary | ICD-10-CM

## 2021-02-19 DIAGNOSIS — F5105 Insomnia due to other mental disorder: Secondary | ICD-10-CM

## 2021-02-19 DIAGNOSIS — F902 Attention-deficit hyperactivity disorder, combined type: Secondary | ICD-10-CM | POA: Diagnosis not present

## 2021-02-19 DIAGNOSIS — G250 Essential tremor: Secondary | ICD-10-CM

## 2021-02-19 DIAGNOSIS — F5081 Binge eating disorder: Secondary | ICD-10-CM

## 2021-02-19 DIAGNOSIS — F411 Generalized anxiety disorder: Secondary | ICD-10-CM | POA: Diagnosis not present

## 2021-02-19 MED ORDER — METHYLPHENIDATE HCL ER (OSM) 54 MG PO TBCR: 54.0000 mg | EXTENDED_RELEASE_TABLET | Freq: Every day | ORAL | 0 refills | Status: DC

## 2021-02-19 NOTE — Progress Notes (Signed)
Pamela Little [id number] 11-25-1970 51 y.o.   Subjective:   Patient ID:  Pamela Little is a 51 y.o. (DOB 1970-08-06) female.  Chief Complaint:  Chief Complaint  Patient presents with   Follow-up   Anxiety   ADHD    Anxiety Symptoms include palpitations. Patient reports no chest pain, confusion, decreased concentration or nervous/anxious behavior.    Medication Refill Pertinent negatives include no chest pain, congestion or weakness.  [name] presents to the office today for follow-up ofweight concerns and pharmacy problems and FU anxiety.  When seen May 2020.  She was having insomnia as we increased the Xanax to 0.5 mg twice daily and 1 mg nightly.  She continued Wellbutrin XL 300 mg every morning, sertraline 150, ropinirole for restless legs, propranolol as needed for anxiety, naltrexone for weight gain. Also taking pramipexole ER.     seen September.  Following change: DT insomnia increase Xanax 0.5 mg BID and 1mg  HS.  Last seen February 16, 2019.  The following was noted: Hanging in there.  Quit taking Naltrexone and pramipexole ER 3mg  .  After tennis would feel unusually fatigued and like she might pass out. More RLS in the afternoon without the pramipexole  The following medical decisions were made: Re: RLS is still on unusual med combination for the following reason: Pramipexole only interfered with sleep.  So stopped. Ropinirole only was too fatiguing. So taking the mixture of the 2 for severe RLS Failed iron trials for RLS Trial gabapentin 300 mg 2 twice daily for RLS.  04/2019 appt noted: She does not feel the gabapentin does much so still taking the ropinirole. No SE.   Milder RLS.   Since the higher dosage of Wellbutrin did not help with weight will leave at 300mg  daily.  May retry higherdose once son is placed.  Consider phentermine. Has been told she's in menopause and is having sweating and hot flashes.  Sleep comes and goes and is not consistent.   Overall mentally OK for the most part.  Definitely anxious.  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  Not depressed.   Can be chronically tired.  Always upset with her weight.  Working normal 40 + hours.  Chronically stressed. Has been a little anxious and been stressed with H sick with leukemia.  Son 2 yo still driving them nuts and not doing school.  Still struggling big time with him.  That will make her weepy.     Definitely anxious.  Struggles with weight.  Plans to see GYN  Trouble staying asleep and 5 hours average, not enough.  Some awakening with anxiety and worrying over son being out and whether he's safe.  Typical Xanax 0.5mg  TID Took Wellb 450 am for awhile and did not lose weight or see any benefit or SE.  Emotional eater and stressed by son's mental health and substance abuse problems and refusing school.  Very angry and defiant.  Anticipates placement somewhere.  Drinking and pot.  Chronically anxious.  Hard on the whole family.  Refusing church.  Pt doesn't think changing her med will help.  I keep eating.  Not ususal for her.  Not dep but upset with her son.  No severe panic attacks.  Best very frustrated with her binge eating at this time but feels the stress at home is precipitating it.  Switched Ativan to Xanax and been OK with that.  Not sure re: differences except sleeps better ususally.  Tremor manageable. Plan:   Change to 1 mg Xanax DT QL.   Re: RLS is still on unusual med combination for the following reason: Pramipexole only interfered with sleep. So stopped. Ropinirole only was too fatiguing.  Failed iron trials for RLS Trial higher gabapentin 600 mg 2 twice daily for RLS.  Might also help anxiety. Caffeine 1 diet Coke or less.    11/24/19 appt noted: A lot of stress.  2 panic attacks.  Never been so busy and stressed as now.  Menopause.  Mind racing at night and not resting well. Had stopped naltrexone and ER pramipexole bc felt it was sedating. RLS is  still a problem and reduced ropinirole and pramipexole to deal with sedative SE. Wishes she slept better.  CO EMA usually not due to RLS.  Feels RLS more in hands. Sleep 4-5 hours average. Tries to do too much and trouble finishing things.  Son has ADD and wonders if that's part of her problem. Plan: no med changes  06/05/2020 appointment with the following noted: Doing OK. Covid December 2021.  Can still struggle with RLS in legs and hands. Not taking propranolol much. Asks to increase pramipexole for RLS to  0.375 mg in addition to Ropinirole 2 mg PM. Limited to 1 Coke Zero as late as late afternoon. Wonders about ADD. Not binge eating. History palpitations with cardiology appt next week. Some tremor and wonders about reducing Wellbutrin bc no problems with depression lately. Considering ADD tx.  Defer. Anxiety manageable. Plan: Re: RLS is still on unusual med combination for the following reason: Pramipexole only interfered with sleep. Ropinirole only was too fatiguing. OK to use combo if necessary as previously required. Ropinirole 2 mg PM with pramipexole 0.375 mg PM Stop afternoon caffeine. Disc insomnia trazodone vs Sonata.   Reduce Wellbutrin to see if tremor is better.  12/12/20 appt noted: Caffeine contributes to tremor.  Not sure problem with less Wellbutrin.  Always mild depression. Still active. Above dosing and still had terrrible RLS and trouble sleeping.  With Ropinirole alone it helped some.  Spreading out ropinirole helped.  Pramipexole didn't help. No SE except some fatigue.  Split ropinirole helped that and trazodone helped. Rare anxiety in middle of sports activity.  Cleared by cardiology.  Anxiety managed. Still wonders about ADD bc trouble finishing things.  So many things she doesn't finish.  Frustrated.  H notices.  Wants to try ADD tx. Feels needs AM Xanax bc gets demands of day stressed. Plan: Wean AM Xanax and start concerta for ADD 36 mg AM  02/19/2021  appt noted: Not much benefit noted with Concerta after the first couple of days.   Took it off and on over the holidays. Only Xanax at night. MorE  RLS lately and taking 2 ropinirole 1 mg  usually but increased to 3 in PM. Asks about increasin gConcerta.  No SE Palpitations not worse.   Dx mild - Moderate OSA ordered CPAP but back log.  Stress H with Leukemia and recent hospitalization for heart problems.  Past Psychiatric Medication Trials:  Fluoxetine, Wellbutrin 450 no wt loss,  venlafaxine, sertraline 300, Lexapro 30, fluvoxamine, buspirone,  clonazepam, lorazepam, Xanax 0.5 Abilify,  Vistaril, propranolol, Mirapex, naltrexone,  topiramate 200 fatigue, Pramipexole only interfered with sleep. Pramipexole ER Ropinirole only was too fatiguing. So taking the mixture of the 2 for severe RLS Failed iron trials for RLS Gabapentin 600 Naltrexone excessive weakness.  Review of Systems:  Review of Systems  HENT:  Negative for congestion.   Cardiovascular:  Positive for palpitations. Negative for chest pain.  Neurological:  Negative for tremors and weakness.  Psychiatric/Behavioral:  Positive for sleep disturbance. Negative for agitation, behavioral problems, confusion, decreased concentration, dysphoric mood, hallucinations and self-injury. The patient is not nervous/anxious and is not hyperactive.    Medications: I have reviewed the patient's current medications.  Current Outpatient Medications  Medication Sig Dispense Refill   albuterol (VENTOLIN HFA) 108 (90 Base) MCG/ACT inhaler      ALPRAZolam (XANAX) 0.5 MG tablet 1 tablet twice daily as needed for anxiety and 1 to 2 tablets at night as needed for sleep. 90 tablet 0   buPROPion (WELLBUTRIN XL) 150 MG 24 hr tablet TAKE 1 TABLET DAILY 90 tablet 0   methylphenidate (CONCERTA) 36 MG PO CR tablet Take 1 tablet (36 mg total) by mouth daily. 30 tablet 0   montelukast (SINGULAIR) 10 MG tablet Take 10 mg by mouth daily.      rOPINIRole  (REQUIP) 1 MG tablet Take 3 tablets (3 mg total) by mouth at bedtime. 270 tablet 1   sertraline (ZOLOFT) 100 MG tablet TAKE ONE AND ONE-HALF TABLETS DAILY (Patient taking differently: 1 tab daily) 135 tablet 0   traZODone (DESYREL) 50 MG tablet TAKE 1 TO 2 TABLETS AT BEDTIME 180 tablet 0   No current facility-administered medications for this visit.    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   Vertigo     Family History  Problem Relation Age of Onset   Hypertension Mother    Hypertension Father    Tremor Sister    Heart attack Neg Hx    Diabetes Neg Hx    Colon cancer Neg Hx    Colon polyps Neg Hx    Stomach cancer Neg Hx    Esophageal cancer Neg Hx     Social History   Socioeconomic History   Marital status: Married    Spouse name: Not on file   Number of children: 3   Years of education: college   Highest education level: Not on file  Occupational History   Not on file  Tobacco Use   Smoking status: Never   Smokeless tobacco: Never  Vaping Use   Vaping Use: Never used  Substance and Sexual Activity   Alcohol use: Yes    Alcohol/week: 1.0 standard drink    Types: 1 Cans of beer per week    Comment: occasionaly   Drug use: Never   Sexual activity: Not on file  Other Topics Concern   Not on file  Social History Narrative   Patient is right handed.   Patient drinks 1 cup caffeine daily.   Social Determinants of Health   Financial Resource Strain: Not on file  Food Insecurity: Not on file  Transportation Needs: Not on file  Physical Activity: Not on file  Stress: Not on file  Social Connections: Not on file  Intimate Partner Violence: Not on file    Past Medical History, Surgical history, Social history, and Family history were reviewed and updated  as appropriate.   Please see review of systems for further details on the patient's review from today.   Objective:   Physical Exam:  BP 122/78  Pulse 62   Physical Exam Constitutional:      General: She is not in acute distress. Musculoskeletal:        General: No deformity.  Neurological:     Mental Status: She is alert and oriented to person, place, and time.     Cranial Nerves: No dysarthria.     Coordination: Coordination normal.  Psychiatric:        Attention and Perception: Perception normal. She is inattentive. She does not perceive auditory or visual hallucinations.        Mood and Affect: Mood is anxious and depressed. Affect is not labile, blunt, angry or inappropriate.        Speech: Speech normal. Speech is not rapid and pressured.        Behavior: Behavior normal. Behavior is cooperative.        Thought Content: Thought content normal. Thought content is not paranoid or delusional. Thought content does not include homicidal or suicidal ideation. Thought content does not include suicidal plan.        Cognition and Memory: Cognition and memory normal.        Judgment: Judgment normal.     Comments: Insight pretty good. She is chronically anxious it is never been completely controlled but is better Residual depression    Lab Review:     Component Value Date/Time   NA 137 06/18/2018 1901   K 3.4 (L) 06/18/2018 1901   CL 104 06/18/2018 1901   CO2 24 06/18/2018 1901   GLUCOSE 107 (H) 06/18/2018 1901   BUN 16 06/18/2018 1901   CREATININE 0.96 06/18/2018 1901   CALCIUM 8.8 (L) 06/18/2018 1901   GFRNONAA >60 06/18/2018 1901   GFRAA >60 06/18/2018 1901       Component Value Date/Time   WBC 12.4 (H) 06/18/2018 1901   RBC 4.43 06/18/2018 1901   HGB 13.4 06/18/2018 1901   HCT 41.0 06/18/2018 1901   PLT 228 06/18/2018 1901   MCV 92.6 06/18/2018 1901   MCH 30.2 06/18/2018 1901   MCHC 32.7 06/18/2018 1901   RDW 13.4 06/18/2018 1901    No results found for:  POCLITH, LITHIUM   No results found for: PHENYTOIN, PHENOBARB, VALPROATE, CBMZ   .res Assessment: Plan:    Attention deficit hyperactivity disorder (ADHD), combined type  Generalized anxiety disorder  Restless legs syndrome  Panic disorder with agoraphobia  Insomnia due to mental condition  Tremor, essential  Binge-eating disorder, moderate    Disc various dx and status of current med regimen.  depression and anxiety are managed.  RLS is not.  Wonders about ADD. Binge eating managed.  Wellbutrin XL 150 mg AM LED bc stimulants.   Change to 1 mg Xanax DT QL.  We discussed the short-term risks associated with benzodiazepines including sedation and increased fall risk among others.  Discussed long-term side effect risk including dependence, potential withdrawal symptoms, and the potential eventual dose-related risk of dementia. Disc new studies refuting the link.  Pramipexole not very effective.  Tolerating ropinirole better by splitting the dosage. Ropinirole 2 mg - 3 mg PM  Stop afternoon caffeine.  Disc insomnia trazodone helpful with Xanax 0.5  Disc mental health ris of untreated OSA  Disc her questions about ADD.  High inattentive score on questionaire. Push fluids.   Increase Concerta for ADD to 54  mg AM (manufact: Trigen, FYI) Discussed potential benefits, risks, and side effects of stimulants with patient to include increased heart rate, palpitations, insomnia, increased anxiety, increased  irritability, or decreased appetite.  Instructed patient to contact office if experiencing any significant tolerability issues. Disc difference between brand, generics  Call if worsening sx  .25 min appt.  FU 2 mos.  Meredith Staggersarey Cottle, MD, DFAPA   Please see After Visit Summary for patient specific instructions.  Future Appointments  Date Time Provider Department Center  06/13/2021 10:15 AM Tessa Lernerolia, [place], DO [facility] None    No orders of the defined types were placed in this  encounter.      -------------------------------

## 2021-03-07 ENCOUNTER — Other Ambulatory Visit: Payer: Self-pay | Admitting: Psychiatry

## 2021-04-02 ENCOUNTER — Telehealth: Payer: Self-pay | Admitting: Psychiatry

## 2021-04-02 NOTE — Telephone Encounter (Signed)
Patient called in for refill on Concerta 54mg . Ph: [phone]. Appt 3/16. Pharmacy [facility] [address] [place]

## 2021-04-03 ENCOUNTER — Other Ambulatory Visit: Payer: Self-pay

## 2021-04-03 DIAGNOSIS — F902 Attention-deficit hyperactivity disorder, combined type: Secondary | ICD-10-CM

## 2021-04-03 MED ORDER — METHYLPHENIDATE HCL ER (OSM) 54 MG PO TBCR: 54.0000 mg | EXTENDED_RELEASE_TABLET | Freq: Every day | ORAL | 0 refills | Status: DC

## 2021-04-03 NOTE — Telephone Encounter (Signed)
Pended.

## 2021-04-29 ENCOUNTER — Other Ambulatory Visit: Payer: Self-pay | Admitting: Family Medicine

## 2021-04-29 DIAGNOSIS — Z1231 Encounter for screening mammogram for malignant neoplasm of breast: Secondary | ICD-10-CM

## 2021-05-02 ENCOUNTER — Ambulatory Visit: Payer: 59 | Admitting: Psychiatry

## 2021-05-13 ENCOUNTER — Other Ambulatory Visit: Payer: Self-pay | Admitting: Psychiatry

## 2021-05-13 DIAGNOSIS — F411 Generalized anxiety disorder: Secondary | ICD-10-CM

## 2021-05-13 DIAGNOSIS — F5105 Insomnia due to other mental disorder: Secondary | ICD-10-CM

## 2021-05-13 DIAGNOSIS — F4001 Agoraphobia with panic disorder: Secondary | ICD-10-CM

## 2021-05-14 NOTE — Telephone Encounter (Signed)
Patient has an appt with Dr. Cottle tomorrow.  

## 2021-05-15 ENCOUNTER — Telehealth (INDEPENDENT_AMBULATORY_CARE_PROVIDER_SITE_OTHER): Payer: 59 | Admitting: Psychiatry

## 2021-05-15 ENCOUNTER — Encounter: Payer: Self-pay | Admitting: Psychiatry

## 2021-05-15 DIAGNOSIS — F411 Generalized anxiety disorder: Secondary | ICD-10-CM

## 2021-05-15 DIAGNOSIS — G2581 Restless legs syndrome: Secondary | ICD-10-CM

## 2021-05-15 DIAGNOSIS — F4001 Agoraphobia with panic disorder: Secondary | ICD-10-CM

## 2021-05-15 DIAGNOSIS — F5105 Insomnia due to other mental disorder: Secondary | ICD-10-CM

## 2021-05-15 DIAGNOSIS — F902 Attention-deficit hyperactivity disorder, combined type: Secondary | ICD-10-CM | POA: Diagnosis not present

## 2021-05-15 MED ORDER — LISDEXAMFETAMINE DIMESYLATE 50 MG PO CAPS: 50.0000 mg | ORAL_CAPSULE | Freq: Every day | ORAL | 0 refills | Status: DC

## 2021-05-15 NOTE — Progress Notes (Signed)
Pamela Little ?[id number] ?1970-11-22 ?51 y.o. ? ?Video Visit via My Chart ? ?I connected with pt by My Chart and verified that I am speaking with the correct person using two identifiers. ?  ?I discussed the limitations, risks, security and privacy concerns of performing an evaluation and management service by My Chart  and the availability of in person appointments. I also discussed with the patient that there may be a patient responsible charge related to this service. The patient expressed understanding and agreed to proceed. ? ?I discussed the assessment and treatment plan with the patient. The patient was provided an opportunity to ask questions and all were answered. The patient agreed with the plan and demonstrated an understanding of the instructions. ?  ?The patient was advised to call back or seek an in-person evaluation if the symptoms worsen or if the condition fails to improve as anticipated. ? ?I provided 30 minutes of video time during this encounter.  The patient was located at home and the provider was located office. ?Session from 130 until 2 PM ? ?Subjective:  ? ?Patient ID:  Pamela Little is a 51 y.o. (DOB 1970-06-30) female. ? ?Chief Complaint:  ?Chief Complaint  ?Patient presents with  ? Follow-up  ? Attention deficit hyperactivity disorder (ADHD), combined t  ? Generalized anxiety disorder  ? ADD  ? Sleeping Problem  ? ? ?Anxiety ?Symptoms include decreased concentration and palpitations. Patient reports no chest pain, confusion or nervous/anxious behavior.  ? ? ?Medication Refill ?Pertinent negatives include no chest pain, congestion or weakness.  ?Pamela Little presents to the office today for follow-up ofweight concerns and pharmacy problems and FU anxiety. ? ?When seen May 2020.  She was having insomnia as we increased the Xanax to 0.5 mg twice daily and 1 mg nightly.  She continued Wellbutrin XL 300 mg every morning, sertraline 150, ropinirole for restless legs, propranolol as needed for  anxiety, naltrexone for weight gain. Also taking pramipexole ER.   ? ? seen September.  Following change: DT insomnia increase Xanax 0.5 mg BID and 1mg  HS. ? ?seen February 16, 2019.  The following was noted: ?Hanging in there.  Quit taking Naltrexone and pramipexole ER 3mg  .  After tennis would feel unusually fatigued and like she might pass out. ?More RLS in the afternoon without the pramipexole  ?The following medical decisions were made: ?Re: RLS is still on unusual med combination for the following reason: ?Pramipexole only interfered with sleep.  So stopped. ?Ropinirole only was too fatiguing. ?So taking the mixture of the 2 for severe RLS ?Failed iron trials for RLS ?Trial gabapentin 300 mg 2 twice daily for RLS. ? ?04/2019 appt noted: ?She does not feel the gabapentin does much so still taking the ropinirole. No SE.   Milder RLS.   ?Since the higher dosage of Wellbutrin did not help with weight will leave at 300mg  daily.  May retry higherdose once son is placed.  Consider phentermine. ?Has been told she's in menopause and is having sweating and hot flashes.  Sleep comes and goes and is not consistent.  ?Overall mentally OK for the most part.  Definitely anxious.  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  Not depressed.   Can be chronically tired.  Always upset with her weight.  Working normal 40 + hours.  Chronically stressed. ?Has been a little anxious and been stressed with H sick with leukemia.  Son 12 yo still driving them nuts and not doing  school.  Still struggling big time with him.  That will make her weepy.     ?Definitely anxious.  Struggles with weight.  Plans to see GYN  ?Trouble staying asleep and 5 hours average, not enough.  Some awakening with anxiety and worrying over son being out and whether he's safe.  Typical Xanax 0.5mg  TID ?Took Wellb 450 am for awhile and did not lose weight or see any benefit or SE.  Emotional eater and stressed by son's mental health and substance abuse  problems and refusing school.  Very angry and defiant.  Anticipates placement somewhere.  Drinking and pot.  Chronically anxious.  Hard on the whole family.  Refusing church.  Pt doesn't think changing her med will help.  I keep eating.  Not ususal for her.  Not dep but upset with her son.  No severe panic attacks.  Best very frustrated with her binge eating at this time but feels the stress at home is precipitating it. ? Switched Ativan to Xanax and been OK with that.  Not sure re: differences except sleeps better ususally.   ? Tremor manageable. ?Plan:   Change to 1 mg Xanax DT QL.   ?Re: RLS is still on unusual med combination for the following reason: ?Pramipexole only interfered with sleep. So stopped. ?Ropinirole only was too fatiguing. ? Failed iron trials for RLS ?Trial higher gabapentin 600 mg 2 twice daily for RLS.  Might also help anxiety. ?Caffeine 1 diet Coke or less.   ? ?11/24/19 appt noted: ?A lot of stress.  2 panic attacks.  Never been so busy and stressed as now.  Menopause.  Mind racing at night and not resting well. ?Had stopped naltrexone and ER pramipexole bc felt it was sedating. ?RLS is still a problem and reduced ropinirole and pramipexole to deal with sedative SE. ?Wishes she slept better.  CO EMA usually not due to RLS.  Feels RLS more in hands. Sleep 4-5 hours average. ?Tries to do too much and trouble finishing things.  Son has ADD and wonders if that's part of her problem. ?Plan: no med changes ? ?06/05/2020 appointment with the following noted: ?Doing OK. ?Covid December 2021.  ?Can still struggle with RLS in legs and hands. ?Not taking propranolol much. ?Asks to increase pramipexole for RLS to  0.375 mg in addition to Ropinirole 2 mg PM. ?Limited to 1 Coke Zero as late as late afternoon. ?Wonders about ADD. ?Not binge eating. ?History palpitations with cardiology appt next week. ?Some tremor and wonders about reducing Wellbutrin bc no problems with depression lately. ?Considering ADD  tx.  Defer. ?Anxiety manageable. ?Plan: Re: RLS is still on unusual med combination for the following reason: ?Pramipexole only interfered with sleep. ?Ropinirole only was too fatiguing. ?OK to use combo if necessary as previously required. ?Ropinirole 2 mg PM with pramipexole 0.375 mg PM ?Stop afternoon caffeine. ?Disc insomnia trazodone vs Sonata.   ?Reduce Wellbutrin to see if tremor is better. ? ?12/12/20 appt noted: ?Caffeine contributes to tremor.  Not sure problem with less Wellbutrin.  Always mild depression. ?Still active. ?Above dosing and still had terrrible RLS and trouble sleeping.  With Ropinirole alone it helped some.  Spreading out ropinirole helped.  Pramipexole didn't help. ?No SE except some fatigue.  Split ropinirole helped that and trazodone helped. ?Rare anxiety in middle of sports activity.  Cleared by cardiology.  Anxiety managed. ?Still wonders about ADD bc trouble finishing things.  So many things she doesn't finish.  Frustrated.  H notices.  Wants to try ADD tx. ?Feels needs AM Xanax bc gets demands of day stressed. ?Plan: Wean AM Xanax and start concerta for ADD 36 mg AM ? ?02/19/2021 appt noted: ?Not much benefit noted with Concerta after the first couple of days.   Took it off and on over the holidays. ?Only Xanax at night. ?MorE  RLS lately and taking 2 ropinirole 1 mg  usually but increased to 3 in PM. ?Asks about increasin gConcerta.  No SE ?Palpitations not worse.   ?Dx mild - Moderate OSA ordered CPAP but back log. ?Plan: Increase Concerta 54 mg daily ?Continue ropinirole 2 to 3 mg in the evening as needed for restless legs ?Continue sertraline 150 mg daily ?Continue trazodone 150 to 100 mg nightly as needed ?Continue Xanax 0.5 mg 3 times daily as needed anxiety or insomnia ? ?05/15/21 appt noted: ?She thinks it was ok to increase Concerta 54 mg AM.  After a short time doesn't notice much difference.  Initially noticed a change but didn't thereafter.  Skips weekends.  No sig  withdrawal on the weekends.  Constantly moving.   ?Not real impressed with benefit Concerta.  In the beginning was more productive and efficient.   ?Sleep pretty good.  Ropinirole and trazodone help with 6-7 hours

## 2021-05-17 ENCOUNTER — Other Ambulatory Visit: Payer: Self-pay | Admitting: Psychiatry

## 2021-05-17 DIAGNOSIS — F5105 Insomnia due to other mental disorder: Secondary | ICD-10-CM

## 2021-05-27 DIAGNOSIS — G4733 Obstructive sleep apnea (adult) (pediatric): Secondary | ICD-10-CM | POA: Diagnosis not present

## 2021-05-28 DIAGNOSIS — N870 Mild cervical dysplasia: Secondary | ICD-10-CM | POA: Diagnosis not present

## 2021-06-12 ENCOUNTER — Encounter: Payer: Self-pay | Admitting: Student

## 2021-06-12 ENCOUNTER — Ambulatory Visit: Payer: BC Managed Care – PPO | Admitting: Student

## 2021-06-12 VITALS — BP 134/84 | HR 80 | Temp 97.9°F | Resp 17 | Ht 68.0 in | Wt 210.6 lb

## 2021-06-12 DIAGNOSIS — R072 Precordial pain: Secondary | ICD-10-CM

## 2021-06-12 DIAGNOSIS — R002 Palpitations: Secondary | ICD-10-CM | POA: Diagnosis not present

## 2021-06-12 NOTE — Progress Notes (Signed)
? ?Date:  06/12/2021  ? ?ID:  Pamela Little, DOB 1970/12/09, MRN [id number] ? ?PCP:  Donald Prose, MD  ?Cardiologist:  Alethia Berthold, DO, [facility] (established care 04/20/19/2022) ?Former Cardiology Providers: NA ? ?Date: 06/12/21 ?Last Office Visit: 06/13/2020 ? ? ?Chief Complaint  ?Patient presents with  ? Follow-up  ?  6 MONTH  ? Palpitations  ? Chest Pain  ? ? ?HPI  ?Pamela Little is a 51 y.o. female who presents to the office with a chief complaint of " re-evaluation of chest pain and palpitations." Patient's past medical history and cardiovascular risk factors include: History of COVID-19 infection, asthma, depression, insomnia, panic attacks, restless leg syndrome, postmenopausal, obesity. ? ?She is referred to the office at the request of Donald Prose, MD for evaluation of racing heartbeat. ? ?Patient has had palpitations for the last 2 years which have remained relatively stable with no change in intensity, frequency, duration.  She has undergone an echocardiogram which noted preserved LVEF, grade 1 diastolic impairment, no significant valvular heart disease.  Her exercise treadmill stress test was also considered to be low risk.  An extended Holter monitor results reviewed no significant dysrhythmias. ? ?Patient presents for 6 month follow up. Since last office visit patient has had a single episode of palpitations while playing an intense pickleball that last less than 1 minute and was without associated symptoms. She has also had rare episodes of chest tightness which is self-limited, non-exertional and brief. She continues to play tennis and pickleball regularly without issue.  ? ?Notably patient was prescribed CPAP by Dr. Dorene Ar for mild to moderate sleep apnea, however patient has not been compliant with this over the last few months.  ? ?FUNCTIONAL STATUS: ?Regularly plays tennis and pickleball for 2-3 hours twice a week.   ? ?ALLERGIES: ?No Known Allergies ? ?MEDICATION LIST PRIOR TO VISIT: ?Current  Meds  ?Medication Sig  ? albuterol (VENTOLIN HFA) 108 (90 Base) MCG/ACT inhaler   ? buPROPion (WELLBUTRIN XL) 150 MG 24 hr tablet TAKE 1 TABLET DAILY  ? estradiol (ESTRACE) 2 MG tablet Take 2 mg by mouth daily.  ? lisdexamfetamine (VYVANSE) 50 MG capsule Take 1 capsule (50 mg total) by mouth daily.  ? progesterone (PROMETRIUM) 100 MG capsule Take 100 mg by mouth daily.  ? rOPINIRole (REQUIP) 1 MG tablet Take 3 tablets (3 mg total) by mouth at bedtime.  ? sertraline (ZOLOFT) 100 MG tablet TAKE ONE AND ONE-HALF TABLETS DAILY  ? traZODone (DESYREL) 50 MG tablet TAKE 1 TO 2 TABLETS AT BEDTIME  ?  ? ?PAST MEDICAL HISTORY: ?Past Medical History:  ?Diagnosis Date  ? Asthma   ? with activity  ? Depression   ? Migraine 03/21/2014  ? Panic attacks   ? Restless legs syndrome 03/21/2014  ? Tremor, essential 03/21/2014  ? Vertigo   ? ? ?PAST SURGICAL HISTORY: ?Past Surgical History:  ?Procedure Laterality Date  ? ABLATION ON ENDOMETRIOSIS    ? CESAREAN SECTION WITH BILATERAL TUBAL LIGATION    ? MYRINGOTOMY WITH TUBE PLACEMENT    ? TONSILLECTOMY    ? ? ?FAMILY HISTORY: ?The patient family history includes Hypertension in her father and mother; Tremor in her sister. ? ?SOCIAL HISTORY:  ?The patient  reports that she has never smoked. She has never used smokeless tobacco. She reports current alcohol use of about 1.0 standard drink per week. She reports that she does not use drugs. ? ?REVIEW OF SYSTEMS: ?Review of Systems  ?Constitutional: Negative for chills,  fever, weight gain and weight loss.  ?HENT:  Negative for hoarse voice and nosebleeds.   ?Eyes:  Negative for discharge, double vision and pain.  ?Cardiovascular:  Positive for chest pain (less frequent) and palpitations (single epsidoe in 6 months). Negative for claudication, dyspnea on exertion, leg swelling, near-syncope, orthopnea, paroxysmal nocturnal dyspnea and syncope.  ?Respiratory:  Positive for snoring. Negative for hemoptysis, shortness of breath and wheezing.    ?Musculoskeletal:  Negative for muscle cramps and myalgias.  ?Gastrointestinal:  Negative for abdominal pain, constipation, diarrhea, hematemesis, hematochezia, melena, nausea and vomiting.  ?Neurological:  Negative for dizziness and light-headedness.  ? ?PHYSICAL EXAM: ? ?  06/12/2021  ? 11:36 AM 02/19/2021  ? 10:36 AM 12/13/2020  ? 10:25 AM  ?Vitals with BMI  ?Height 5\' 8"   5\' 8"   ?Weight 210 lbs 10 oz  211 lbs 6 oz  ?BMI 32.03  32.15  ?Systolic [id number]  [id number]  ?Diastolic 84  85  ?Pulse 80  58  ?  ? Information is confidential and restricted. Go to Review Flowsheets to unlock data.  ? ?CONSTITUTIONAL: Well-developed and well-nourished. No acute distress.  ?SKIN: Skin is warm and dry. No rash noted. No cyanosis. No pallor. No jaundice ?HEAD: Normocephalic and atraumatic.  ?EYES: No scleral icterus ?MOUTH/THROAT: Moist oral membranes.  ?NECK: No JVD present. No thyromegaly noted. No carotid bruits  ?LYMPHATIC: No visible cervical adenopathy.  ?CHEST Normal respiratory effort. No intercostal retractions  ?LUNGS: Clear to auscultation bilaterally.  No stridor. No wheezes. No rales.  ?CARDIOVASCULAR: Regular rate and rhythm, positive S1-S2, no murmurs rubs or gallops appreciated. ?ABDOMINAL: Obese, soft, nontender, nondistended, positive bowel sounds in all 4 quadrants. no apparent ascites.  ?EXTREMITIES: No peripheral edema  ?HEMATOLOGIC: No significant bruising ?NEUROLOGIC: Oriented to person, place, and time. Nonfocal. Normal muscle tone.  ?PSYCHIATRIC: Normal mood and affect. Normal behavior. Cooperative ?Physical exam unchanged compared to previous office visit.  ? ?CARDIAC DATABASE: ?EKG: ?06/12/2021: Sinus rhythm at a rate of 59 bpm.  Normal axis.  No evidence of ischemia or underlying injury pattern.  Change compared to EKG 12/13/2020. ? ?Echocardiogram: ?05/22/2020:  ?Left ventricle cavity is normal in size and wall thickness.  ?Normal global wall motion.  ?Normal LV systolic function with EF 63%.  ?Doppler evidence of  grade I (impaired) diastolic dysfunction, normal LAP. Mild pulmonic regurgitation.  ?No evidence of pulmonary hypertension. ?  ?Stress Testing: ?05/21/2020: ?Exercise treadmill stress test performed using Bruce protocol.   ?Patient reached 7 METS, and 89% of age predicted maximum heart rate.   ?Exercise capacity was low.   ?No chest pain reported.  ?Normal heart rate and hemodynamic response.  ?Frequent PVC's seen in stage 1 of Bruce protocol.  ?Peak ECG demonstrated sinus tachycardia, no ST-T wave abnormalities.  ?Low risk study. ? ?Heart Catheterization: ?None ? ?7 day extended Holter monitor: ?Dominant rhythm normal sinus.  ?Heart rate 30-159 bpm.  Avg HR 71 bpm. ?No atrial fibrillation, supraventricular or ventricular tachycardia, high grade AV block, pauses (3 seconds or longer). ?Two episodes of second degree AV block on 05/10/20 and 05/11/20 during early hours (5:47am and 5:46am respectively-asymptomatic).  ?Total ventricular ectopic burden <1%. ?Total supraventricular ectopic burden <1%. ?Patient triggered events: 4.  These did not correlate with any significant dysrhythmias.  ? ?LABORATORY DATA: ? ?  Latest Ref Rng & Units 06/18/2018  ?  7:01 PM  ?CBC  ?WBC 4.0 - 10.5 K/uL 12.4    ?Hemoglobin 12.0 - 15.0 g/dL 13.4    ?Hematocrit  36.0 - 46.0 % 41.0    ?Platelets 150 - 400 K/uL 228    ? ? ? ?  Latest Ref Rng & Units 06/18/2018  ?  7:01 PM  ?CMP  ?Glucose 70 - 99 mg/dL 107    ?BUN 6 - 20 mg/dL 16    ?Creatinine 0.44 - 1.00 mg/dL 0.96    ?Sodium 135 - 145 mmol/L 137    ?Potassium 3.5 - 5.1 mmol/L 3.4    ?Chloride 98 - 111 mmol/L 104    ?CO2 22 - 32 mmol/L 24    ?Calcium 8.9 - 10.3 mg/dL 8.8    ? ? ?Lipid Panel  ?No results found for: CHOL, TRIG, HDL, CHOLHDL, VLDL, LDLCALC, LDLDIRECT, LABVLDL ? ?No components found for: NTPROBNP ?No results for input(s): PROBNP in the last 8760 hours. ?No results for input(s): TSH in the last 8760 hours. ? ?BMP ?No results for input(s): NA, K, CL, CO2, GLUCOSE, BUN, CREATININE,  CALCIUM, GFRNONAA, GFRAA in the last 8760 hours. ? ?HEMOGLOBIN A1C ?No results found for: HGBA1C, MPG ? ?External Labs: ?Collected: 04/2020, provided by PCP ?Hemoglobin 12.4 g/dL, hematocrit 37.4% ?Creatinine 0.95

## 2021-06-13 ENCOUNTER — Ambulatory Visit: Payer: BC Managed Care – PPO | Admitting: Cardiology

## 2021-06-13 ENCOUNTER — Ambulatory Visit
Admission: RE | Admit: 2021-06-13 | Discharge: 2021-06-13 | Disposition: A | Discharge status: Home / Self Care | Payer: BC Managed Care – PPO | Source: Ambulatory Visit | Attending: Family Medicine | Admitting: Family Medicine

## 2021-06-13 DIAGNOSIS — Z1231 Encounter for screening mammogram for malignant neoplasm of breast: Secondary | ICD-10-CM

## 2021-06-21 ENCOUNTER — Telehealth: Payer: Self-pay | Admitting: Psychiatry

## 2021-06-21 ENCOUNTER — Other Ambulatory Visit: Payer: Self-pay | Admitting: Psychiatry

## 2021-06-21 DIAGNOSIS — F902 Attention-deficit hyperactivity disorder, combined type: Secondary | ICD-10-CM

## 2021-06-21 MED ORDER — LISDEXAMFETAMINE DIMESYLATE 50 MG PO CAPS: 50.0000 mg | ORAL_CAPSULE | Freq: Every day | ORAL | 0 refills | Status: DC

## 2021-06-21 NOTE — Telephone Encounter (Signed)
Pt lvm at 2:45 pm asking for a refill on her vyvanse 50 mg . [facility] [address]. She is completely ouit ?

## 2021-06-26 DIAGNOSIS — G4733 Obstructive sleep apnea (adult) (pediatric): Secondary | ICD-10-CM | POA: Diagnosis not present

## 2021-06-28 ENCOUNTER — Other Ambulatory Visit: Payer: Self-pay | Admitting: Psychiatry

## 2021-06-28 DIAGNOSIS — G2581 Restless legs syndrome: Secondary | ICD-10-CM

## 2021-07-15 ENCOUNTER — Other Ambulatory Visit: Payer: Self-pay | Admitting: Psychiatry

## 2021-07-15 DIAGNOSIS — G2581 Restless legs syndrome: Secondary | ICD-10-CM

## 2021-07-27 DIAGNOSIS — G4733 Obstructive sleep apnea (adult) (pediatric): Secondary | ICD-10-CM | POA: Diagnosis not present

## 2021-07-30 ENCOUNTER — Ambulatory Visit (INDEPENDENT_AMBULATORY_CARE_PROVIDER_SITE_OTHER): Payer: BC Managed Care – PPO | Admitting: Psychiatry

## 2021-07-30 ENCOUNTER — Encounter: Payer: Self-pay | Admitting: Psychiatry

## 2021-07-30 VITALS — BP 126/81 | HR 67

## 2021-07-30 DIAGNOSIS — F902 Attention-deficit hyperactivity disorder, combined type: Secondary | ICD-10-CM | POA: Diagnosis not present

## 2021-07-30 DIAGNOSIS — G250 Essential tremor: Secondary | ICD-10-CM

## 2021-07-30 DIAGNOSIS — F411 Generalized anxiety disorder: Secondary | ICD-10-CM | POA: Diagnosis not present

## 2021-07-30 DIAGNOSIS — F4001 Agoraphobia with panic disorder: Secondary | ICD-10-CM

## 2021-07-30 DIAGNOSIS — F5105 Insomnia due to other mental disorder: Secondary | ICD-10-CM

## 2021-07-30 DIAGNOSIS — G2581 Restless legs syndrome: Secondary | ICD-10-CM

## 2021-07-30 DIAGNOSIS — G4733 Obstructive sleep apnea (adult) (pediatric): Secondary | ICD-10-CM

## 2021-07-30 MED ORDER — LISDEXAMFETAMINE DIMESYLATE 60 MG PO CAPS: 60.0000 mg | ORAL_CAPSULE | Freq: Every day | ORAL | 0 refills | Status: DC

## 2021-07-30 NOTE — Progress Notes (Signed)
Pamela Little [id number] [birth year] 51 y.o.    Subjective:   Patient ID:  Pamela Little is a 51 y.o. (DOB [birth year]) female.  Chief Complaint:  Chief Complaint  Patient presents with   Follow-up   Anxiety   ADHD   Sleeping Problem    Anxiety Symptoms include decreased concentration and palpitations. Patient reports no chest pain, confusion or nervous/anxious behavior.    Medication Refill Pertinent negatives include no chest pain, congestion or weakness.   Pamela Little presents to the office today for follow-up ofweight concerns and pharmacy problems and FU anxiety.  When seen May 2020.  She was having insomnia as we increased the Xanax to 0.5 mg twice daily and 1 mg nightly.  She continued Wellbutrin XL 300 mg every morning, sertraline 150, ropinirole for restless legs, propranolol as needed for anxiety, naltrexone for weight gain. Also taking pramipexole ER.     seen September.  Following change: DT insomnia increase Xanax 0.5 mg BID and  HS.  seen February 16, 2019.  The following was noted: Hanging in there.  Quit taking Naltrexone and pramipexole ER  .  After tennis would feel unusually fatigued and like she might pass out. More RLS in the afternoon without the pramipexole  The following medical decisions were made: Re: RLS is still on unusual med combination for the following reason: Pramipexole only interfered with sleep.  So stopped. Ropinirole only was too fatiguing. So taking the mixture of the 2 for severe RLS Failed iron trials for RLS Trial gabapentin 300 mg 2 twice daily for RLS.  04/2019 appt noted: She does not feel the gabapentin does much so still taking the ropinirole. No SE.   Milder RLS.   Since the higher dosage of Wellbutrin did not help with weight will leave at  daily.  May retry higherdose once son is placed.  Consider phentermine. Has been told she's in menopause and is having sweating and hot flashes.  Sleep comes and goes and is  not consistent.  Overall mentally OK for the most part.  Definitely anxious.  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  Not depressed.   Can be chronically tired.  Always upset with her weight.  Working normal 40 + hours.  Chronically stressed. Has been a little anxious and been stressed with H sick with leukemia.  Son 21 yo still driving them nuts and not doing school.  Still struggling big time with him.  That will make her weepy.     Definitely anxious.  Struggles with weight.  Plans to see GYN  Trouble staying asleep and 5 hours average, not enough.  Some awakening with anxiety and worrying over son being out and whether he's safe.  Typical Xanax 0.5mg  TID Took Wellb 450 am for awhile and did not lose weight or see any benefit or SE.  Emotional eater and stressed by son's mental health and substance abuse problems and refusing school.  Very angry and defiant.  Anticipates placement somewhere.  Drinking and pot.  Chronically anxious.  Hard on the whole family.  Refusing church.  Pt doesn't think changing her med will help.  I keep eating.  Not ususal for her.  Not dep but upset with her son.  No severe panic attacks.  Best very frustrated with her binge eating at this time but feels the stress at home is precipitating it.  Switched Ativan to Xanax and been OK with that.  Not sure re: differences except  sleeps better ususally.    Tremor manageable. Plan:   Change to 1 mg Xanax DT QL.   Re: RLS is still on unusual med combination for the following reason: Pramipexole only interfered with sleep. So stopped. Ropinirole only was too fatiguing.  Failed iron trials for RLS Trial higher gabapentin 600 mg 2 twice daily for RLS.  Might also help anxiety. Caffeine 1 diet Coke or less.    11/24/19 appt noted: A lot of stress.  2 panic attacks.  Never been so busy and stressed as now.  Menopause.  Mind racing at night and not resting well. Had stopped naltrexone and ER pramipexole bc felt it was  sedating. RLS is still a problem and reduced ropinirole and pramipexole to deal with sedative SE. Wishes she slept better.  CO EMA usually not due to RLS.  Feels RLS more in hands. Sleep 4-5 hours average. Tries to do too much and trouble finishing things.  Son has ADD and wonders if that's part of her problem. Plan: no med changes  06/05/2020 appointment with the following noted: Doing OK. Covid December 2021.  Can still struggle with RLS in legs and hands. Not taking propranolol much. Asks to increase pramipexole for RLS to  0.375 mg in addition to Ropinirole 2 mg PM. Limited to 1 Coke Zero as late as late afternoon. Wonders about ADD. Not binge eating. History palpitations with cardiology appt next week. Some tremor and wonders about reducing Wellbutrin bc no problems with depression lately. Considering ADD tx.  Defer. Anxiety manageable. Plan: Re: RLS is still on unusual med combination for the following reason: Pramipexole only interfered with sleep. Ropinirole only was too fatiguing. OK to use combo if necessary as previously required. Ropinirole 2 mg PM with pramipexole 0.375 mg PM Stop afternoon caffeine. Disc insomnia trazodone vs Sonata.   Reduce Wellbutrin to see if tremor is better.  12/12/20 appt noted: Caffeine contributes to tremor.  Not sure problem with less Wellbutrin.  Always mild depression. Still active. Above dosing and still had terrrible RLS and trouble sleeping.  With Ropinirole alone it helped some.  Spreading out ropinirole helped.  Pramipexole didn't help. No SE except some fatigue.  Split ropinirole helped that and trazodone helped. Rare anxiety in middle of sports activity.  Cleared by cardiology.  Anxiety managed. Still wonders about ADD bc trouble finishing things.  So many things she doesn't finish.  Frustrated.  H notices.  Wants to try ADD tx. Feels needs AM Xanax bc gets demands of day stressed. Plan: Wean AM Xanax and start concerta for ADD 36  mg AM  02/19/2021 appt noted: Not much benefit noted with Concerta after the first couple of days.   Took it off and on over the holidays. Only Xanax at night. MorE  RLS lately and taking 2 ropinirole 1 mg  usually but increased to 3 in PM. Asks about increasin gConcerta.  No SE Palpitations not worse.   Dx mild - Moderate OSA ordered CPAP but back log. Plan: Increase Concerta 54 mg daily Continue ropinirole 2 to 3 mg in the evening as needed for restless legs Continue sertraline 150 mg daily Continue trazodone 150 to 100 mg nightly as needed Continue Xanax 0.5 mg 3 times daily as needed anxiety or insomnia  05/15/21 appt noted: She thinks it was ok to increase Concerta 54 mg AM.  After a short time doesn't notice much difference.  Initially noticed a change but didn't thereafter.  Skips weekends.  No  sig withdrawal on the weekends.  Constantly moving.   Not real impressed with benefit Concerta.  In the beginning was more productive and efficient.   Sleep pretty good.  Ropinirole and trazodone help with 6-7 hours of sleep.  Less awakening.   No SE.  No sig Xanax during the day.  For the most part anxiety is manageable.  Stress H lost his job and a lot on her plate but is OK with anxiety usually.  No sig panic. Plan: Switch Concerta 54 to Vyvanse 50 mg AM  07/30/2021 appointment the following noted: Busy time at work.  Works for [employer]. Concerta 54 was highest dose and limited response. Can't tell a big difference with Vyvanse and Concerta. A little anxious lately but has some stress. Overwhelmed at times.  Near panic a couple of times. Not depressed. Hates RLS.  Hard to manage it at times. Sleep 4-7 hours.  Using CPAP trying masks starting in Feb and use off and on.  Stress H with Leukemia and recent hospitalization for heart problems.  Past Psychiatric Medication Trials:  Fluoxetine, Wellbutrin 450 no wt loss,  venlafaxine, sertraline 300, Lexapro 30, fluvoxamine, buspirone,   clonazepam, lorazepam, Xanax 0.5 Abilify,   Concerta 54 limited effect Vyvanse 50  Vistaril, propranolol, Mirapex, naltrexone nausea  topiramate 200 fatigue, Pramipexole only interfered with sleep. Pramipexole ER Ropinirole only was too fatiguing. So taking the mixture of the 2 for severe RLS Failed iron trials for RLS Gabapentin 600 Naltrexone excessive weakness.  Review of Systems:  Review of Systems  HENT:  Negative for congestion.   Cardiovascular:  Positive for palpitations. Negative for chest pain.  Neurological:  Negative for tremors and weakness.  Psychiatric/Behavioral:  Positive for decreased concentration and sleep disturbance. Negative for agitation, behavioral problems, confusion, dysphoric mood, hallucinations and self-injury. The patient is not nervous/anxious and is not hyperactive.     Medications: I have reviewed the patient's current medications.  Current Outpatient Medications  Medication Sig Dispense Refill   albuterol (VENTOLIN HFA) 108 (90 Base) MCG/ACT inhaler      buPROPion (WELLBUTRIN XL) 150 MG 24 hr tablet TAKE 1 TABLET DAILY 90 tablet 3   estradiol (ESTRACE) 2 MG tablet Take 2 mg by mouth daily.     lisdexamfetamine (VYVANSE) 50 MG capsule Take 1 capsule (50 mg total) by mouth daily. 30 capsule 0   lisdexamfetamine (VYVANSE) 50 MG capsule Take 1 capsule (50 mg total) by mouth daily. 30 capsule 0   progesterone (PROMETRIUM) 100 MG capsule Take 100 mg by mouth daily.     rOPINIRole (REQUIP) 1 MG tablet TAKE 3 TABLETS BY MOUTH AT BEDTIME 90 tablet 0   sertraline (ZOLOFT) 100 MG tablet TAKE ONE AND ONE-HALF TABLETS DAILY (Patient taking differently: 1 daily) 135 tablet 3   traZODone (DESYREL) 50 MG tablet TAKE 1 TO 2 TABLETS AT BEDTIME 180 tablet 3   No current facility-administered medications for this visit.    Medication Side Effects: None  Allergies: No Known Allergies  Past Medical History:  Diagnosis Date   Asthma    with activity    Depression    Migraine 03/21/2014   Panic attacks    Restless legs syndrome 03/21/2014   Tremor, essential 03/21/2014   Vertigo     Family History  Problem Relation Age of Onset   Hypertension Mother    Hypertension Father    Tremor Sister    Heart attack Neg Hx    Diabetes Neg Hx  Colon cancer Neg Hx    Colon polyps Neg Hx    Stomach cancer Neg Hx    Esophageal cancer Neg Hx     Social History   Socioeconomic History   Marital status: Married    Spouse name: Not on file   Number of children: 3   Years of education: college   Highest education level: Not on file  Occupational History   Not on file  Tobacco Use   Smoking status: Never   Smokeless tobacco: Never  Vaping Use   Vaping Use: Never used  Substance and Sexual Activity   Alcohol use: Yes    Alcohol/week: 1.0 standard drink of alcohol    Types: 1 Cans of beer per week    Comment: occasionaly   Drug use: Never   Sexual activity: Not on file  Other Topics Concern   Not on file  Social History Narrative   Patient is right handed.   Patient drinks 1 cup caffeine daily.   Social Determinants of Health   Financial Resource Strain: Not on file  Food Insecurity: Not on file  Transportation Needs: Not on file  Physical Activity: Not on file  Stress: Not on file  Social Connections: Not on file  Intimate Partner Violence: Not on file    Past Medical History, Surgical history, Social history, and Family history were reviewed and updated as appropriate.   Please see review of systems for further details on the patient's review from today.   Objective:   Physical Exam:  BP 126/81   Pulse 67   Physical Exam Constitutional:      General: She is not in acute distress. Musculoskeletal:        General: No deformity.  Neurological:     Mental Status: She is alert and oriented to person, place, and time.     Cranial Nerves: No dysarthria.     Coordination: Coordination normal.  Psychiatric:         Attention and Perception: Perception normal. She is inattentive. She does not perceive auditory or visual hallucinations.        Mood and Affect: Mood is anxious and depressed. Affect is not labile, blunt, angry or inappropriate.        Speech: Speech normal.        Behavior: Behavior normal. Behavior is cooperative.        Thought Content: Thought content normal. Thought content is not paranoid or delusional. Thought content does not include homicidal or suicidal ideation. Thought content does not include suicidal plan.        Cognition and Memory: Cognition and memory normal.        Judgment: Judgment normal.     Comments: Insight pretty good. She is chronically anxious it is never been completely controlled but is better Residual depression     Lab Review:     Component Value Date/Time   NA 137 06/18/2018 1901   K 3.4 (L) 06/18/2018 1901   CL 104 06/18/2018 1901   CO2 24 06/18/2018 1901   GLUCOSE 107 (H) 06/18/2018 1901   BUN 16 06/18/2018 1901   CREATININE 0.96 06/18/2018 1901   CALCIUM 8.8 (L) 06/18/2018 1901   GFRNONAA >60 06/18/2018 1901   GFRAA >60 06/18/2018 1901       Component Value Date/Time   WBC 12.4 (H) 06/18/2018 1901   RBC 4.43 06/18/2018 1901   HGB 13.4 06/18/2018 1901   HCT 41.0 06/18/2018 1901   PLT 228 06/18/2018  1901   MCV 92.6 06/18/2018 1901   MCH 30.2 06/18/2018 1901   MCHC 32.7 06/18/2018 1901   RDW 13.4 06/18/2018 1901    No results found for: "POCLITH", "LITHIUM"   No results found for: "PHENYTOIN", "PHENOBARB", "VALPROATE", "CBMZ"   .res Assessment: Plan:    Generalized anxiety disorder  Panic disorder with agoraphobia  Attention deficit hyperactivity disorder (ADHD), combined type  Restless legs syndrome  Insomnia due to mental condition  Tremor, essential  Moderate obstructive sleep apnea-hypopnea syndrome    Disc various dx and status of current med regimen.  depression and anxiety are managed.  RLS is not.   Binge  eating managed.  Wellbutrin XL 150 mg AM LED bc stimulants. CONTINUE  Sertraline 100 per her request.   Change to 1 mg Xanax DT QL.  We discussed the short-term risks associated with benzodiazepines including sedation and increased fall risk among others.  Discussed long-term side effect risk including dependence, potential withdrawal symptoms, and the potential eventual dose-related risk of dementia. Disc new studies refuting the link.  Pramipexole not very effective.  Tolerating ropinirole better by splitting the dosage. Disc timing of meds.  Disc options but have failed multiple ones. Ropinirole 2 mg - 3 mg PM  Stop afternoon caffeine.  Disc insomnia trazodone helpful with Xanax 0.5  Disc mental health ris of untreated OSA.  Extensive discussion of adjustments and masks.  Disc her questions about ADD.  High inattentive score on questionaire. Push fluids.   Increase Vyvanse 60 mg AM Discussed potential benefits, risks, and side effects of stimulants with patient to include increased heart rate, palpitations, insomnia, increased anxiety, increased irritability, or decreased appetite.  Instructed patient to contact office if experiencing any significant tolerability issues. Disc difference between brand, generics  Call if worsening sx  .25 min appt.  FU 2 mos.  Meredith Staggersarey Cottle, MD, DFAPA   Please see After Visit Summary for patient specific instructions.  No future appointments.   No orders of the defined types were placed in this encounter.      -------------------------------

## 2021-08-07 DIAGNOSIS — R87610 Atypical squamous cells of undetermined significance on cytologic smear of cervix (ASC-US): Secondary | ICD-10-CM | POA: Diagnosis not present

## 2021-08-07 DIAGNOSIS — R87611 Atypical squamous cells cannot exclude high grade squamous intraepithelial lesion on cytologic smear of cervix (ASC-H): Secondary | ICD-10-CM | POA: Diagnosis not present

## 2021-08-08 ENCOUNTER — Other Ambulatory Visit: Payer: Self-pay | Admitting: Psychiatry

## 2021-08-08 DIAGNOSIS — G2581 Restless legs syndrome: Secondary | ICD-10-CM

## 2021-08-19 ENCOUNTER — Other Ambulatory Visit: Payer: Self-pay | Admitting: Psychiatry

## 2021-08-26 DIAGNOSIS — G4733 Obstructive sleep apnea (adult) (pediatric): Secondary | ICD-10-CM | POA: Diagnosis not present

## 2021-09-06 ENCOUNTER — Other Ambulatory Visit: Payer: Self-pay | Admitting: Psychiatry

## 2021-09-06 DIAGNOSIS — F5105 Insomnia due to other mental disorder: Secondary | ICD-10-CM

## 2021-09-10 ENCOUNTER — Other Ambulatory Visit: Payer: Self-pay | Admitting: Psychiatry

## 2021-09-10 DIAGNOSIS — G2581 Restless legs syndrome: Secondary | ICD-10-CM

## 2021-09-13 ENCOUNTER — Telehealth: Payer: Self-pay | Admitting: Psychiatry

## 2021-09-13 NOTE — Telephone Encounter (Signed)
Pt LVM @ 4:06p.  The script for Vyvanse sent in on 7/11 could not be filled.  She said she needs something else to be sent in to [facility] [address]  No upcoming appts scheduled.

## 2021-09-16 ENCOUNTER — Other Ambulatory Visit: Payer: Self-pay

## 2021-09-16 DIAGNOSIS — F902 Attention-deficit hyperactivity disorder, combined type: Secondary | ICD-10-CM

## 2021-09-16 MED ORDER — LISDEXAMFETAMINE DIMESYLATE 60 MG PO CAPS: 60.0000 mg | ORAL_CAPSULE | Freq: Every day | ORAL | 0 refills | Status: DC

## 2021-09-16 NOTE — Telephone Encounter (Signed)
Pended.

## 2021-09-16 NOTE — Telephone Encounter (Signed)
Called pharmacy and medication is out of stock and on backorder.  Pharmacist did not know when a supply would be received. [facility] is [facility] [place] showing [facility] capsules, but none any closer.

## 2021-10-10 ENCOUNTER — Other Ambulatory Visit: Payer: Self-pay | Admitting: Psychiatry

## 2021-10-10 DIAGNOSIS — G2581 Restless legs syndrome: Secondary | ICD-10-CM

## 2021-10-25 ENCOUNTER — Other Ambulatory Visit: Payer: Self-pay

## 2021-10-25 ENCOUNTER — Telehealth: Payer: Self-pay | Admitting: Psychiatry

## 2021-10-25 DIAGNOSIS — F902 Attention-deficit hyperactivity disorder, combined type: Secondary | ICD-10-CM

## 2021-10-25 NOTE — Telephone Encounter (Signed)
Pt LVM at 2:48p.  [facility].  She would rather have something than nothing.  But in order to get the 50mg , she has to have a new script sent.  Next appt 10/30

## 2021-10-25 NOTE — Telephone Encounter (Signed)
Pt LVM at 1:31p.  She would like refill of Vyvanse sent to [facility].  Next appt 10/30

## 2021-10-25 NOTE — Telephone Encounter (Signed)
Pended 50 mg

## 2021-10-25 NOTE — Telephone Encounter (Signed)
Pended.

## 2021-10-28 MED ORDER — LISDEXAMFETAMINE DIMESYLATE 50 MG PO CAPS: 50.0000 mg | ORAL_CAPSULE | Freq: Every day | ORAL | 0 refills | Status: DC

## 2021-10-30 ENCOUNTER — Ambulatory Visit: Payer: BC Managed Care – PPO | Admitting: Psychiatry

## 2021-11-07 ENCOUNTER — Other Ambulatory Visit: Payer: Self-pay | Admitting: Psychiatry

## 2021-11-07 DIAGNOSIS — G2581 Restless legs syndrome: Secondary | ICD-10-CM

## 2021-11-22 ENCOUNTER — Other Ambulatory Visit: Payer: Self-pay

## 2021-11-22 ENCOUNTER — Telehealth: Payer: Self-pay | Admitting: Psychiatry

## 2021-11-22 DIAGNOSIS — F902 Attention-deficit hyperactivity disorder, combined type: Secondary | ICD-10-CM

## 2021-11-22 NOTE — Telephone Encounter (Signed)
Patient called in stating that she is having trouble getting her prescription filled for Vyvanse 60mg . Due to been out of stock last refill of 50mg  she got the 60mg  instead. She needs prescription for 60mg  Vyvanse 60mg  [facility] [place] Ph: [phone] Appt 10/30

## 2021-11-22 NOTE — Telephone Encounter (Signed)
Due 10/10

## 2021-11-25 ENCOUNTER — Other Ambulatory Visit: Payer: Self-pay | Admitting: Psychiatry

## 2021-11-25 DIAGNOSIS — G2581 Restless legs syndrome: Secondary | ICD-10-CM

## 2021-11-26 ENCOUNTER — Other Ambulatory Visit: Payer: Self-pay

## 2021-11-26 DIAGNOSIS — F902 Attention-deficit hyperactivity disorder, combined type: Secondary | ICD-10-CM

## 2021-11-26 MED ORDER — LISDEXAMFETAMINE DIMESYLATE 60 MG PO CAPS: 60.0000 mg | ORAL_CAPSULE | Freq: Every day | ORAL | 0 refills | Status: DC

## 2021-11-26 NOTE — Telephone Encounter (Signed)
Pended.

## 2021-12-16 ENCOUNTER — Encounter: Payer: Self-pay | Admitting: Psychiatry

## 2021-12-16 ENCOUNTER — Ambulatory Visit (INDEPENDENT_AMBULATORY_CARE_PROVIDER_SITE_OTHER): Payer: BC Managed Care – PPO | Admitting: Psychiatry

## 2021-12-16 VITALS — BP 127/85 | HR 84

## 2021-12-16 DIAGNOSIS — F5105 Insomnia due to other mental disorder: Secondary | ICD-10-CM

## 2021-12-16 DIAGNOSIS — G2581 Restless legs syndrome: Secondary | ICD-10-CM

## 2021-12-16 DIAGNOSIS — G250 Essential tremor: Secondary | ICD-10-CM

## 2021-12-16 DIAGNOSIS — F902 Attention-deficit hyperactivity disorder, combined type: Secondary | ICD-10-CM

## 2021-12-16 DIAGNOSIS — F411 Generalized anxiety disorder: Secondary | ICD-10-CM | POA: Diagnosis not present

## 2021-12-16 DIAGNOSIS — F4001 Agoraphobia with panic disorder: Secondary | ICD-10-CM | POA: Diagnosis not present

## 2021-12-16 DIAGNOSIS — G4733 Obstructive sleep apnea (adult) (pediatric): Secondary | ICD-10-CM

## 2021-12-16 DIAGNOSIS — F5081 Binge eating disorder: Secondary | ICD-10-CM

## 2021-12-16 MED ORDER — LISDEXAMFETAMINE DIMESYLATE 60 MG PO CAPS: 60.0000 mg | ORAL_CAPSULE | Freq: Every day | ORAL | 0 refills | Status: DC

## 2021-12-16 MED ORDER — BUPROPION HCL ER (XL) 150 MG PO TB24: 150.0000 mg | ORAL_TABLET | Freq: Every day | ORAL | 1 refills | Status: DC

## 2021-12-16 MED ORDER — SERTRALINE HCL 100 MG PO TABS: 150.0000 mg | ORAL_TABLET | Freq: Every day | ORAL | 3 refills | Status: DC

## 2021-12-16 MED ORDER — ROPINIROLE HCL 1 MG PO TABS
4.0000 mg | ORAL_TABLET | Freq: Every day | ORAL | 2 refills | Status: DC
Start: 2021-12-16 — End: 2022-01-13

## 2021-12-16 NOTE — Progress Notes (Signed)
BLESSIN GEERTS [id number] 09/07/1970 51 y.o.    Subjective:   Patient ID:  Pamela Little is a 51 y.o. (DOB 04-14-1970) female.  Chief Complaint:  Chief Complaint  Patient presents with   Follow-up   Anxiety   ADHD   Sleeping Problem    Anxiety Symptoms include decreased concentration and palpitations. Patient reports no chest pain, confusion or nervous/anxious behavior.    Medication Refill Pertinent negatives include no chest pain or congestion.   Pamela Little presents to the office today for follow-up ofweight concerns and pharmacy problems and FU anxiety.  When seen May 2020.  She was having insomnia as we increased the Xanax to 0.5 mg twice daily and 1 mg nightly.  She continued Wellbutrin XL 300 mg every morning, sertraline 150, ropinirole for restless legs, propranolol as needed for anxiety, naltrexone for weight gain. Also taking pramipexole ER.     seen September.  Following change: DT insomnia increase Xanax 0.5 mg BID and 1mg  HS.  seen February 16, 2019.  The following was noted: Hanging in there.  Quit taking Naltrexone and pramipexole ER 3mg  .  After tennis would feel unusually fatigued and like she might pass out. More RLS in the afternoon without the pramipexole  The following medical decisions were made: Re: RLS is still on unusual med combination for the following reason: Pramipexole only interfered with sleep.  So stopped. Ropinirole only was too fatiguing. So taking the mixture of the 2 for severe RLS Failed iron trials for RLS Trial gabapentin 300 mg 2 twice daily for RLS.  04/2019 appt noted: She does not feel the gabapentin does much so still taking the ropinirole. No SE.   Milder RLS.   Since the higher dosage of Wellbutrin did not help with weight will leave at 300mg  daily.  May retry higherdose once son is placed.  Consider phentermine. Has been told she's in menopause and is having sweating and hot flashes.  Sleep comes and goes and is not  consistent.  Overall mentally OK for the most part.  Definitely anxious.  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  Not depressed.   Can be chronically tired.  Always upset with her weight.  Working normal 40 + hours.  Chronically stressed. Has been a little anxious and been stressed with H sick with leukemia.  Son 52 yo still driving them nuts and not doing school.  Still struggling big time with him.  That will make her weepy.     Definitely anxious.  Struggles with weight.  Plans to see GYN  Trouble staying asleep and 5 hours average, not enough.  Some awakening with anxiety and worrying over son being out and whether he's safe.  Typical Xanax 0.5mg  TID Took Wellb 450 am for awhile and did not lose weight or see any benefit or SE.  Emotional eater and stressed by son's mental health and substance abuse problems and refusing school.  Very angry and defiant.  Anticipates placement somewhere.  Drinking and pot.  Chronically anxious.  Hard on the whole family.  Refusing church.  Pt doesn't think changing her med will help.  I keep eating.  Not ususal for her.  Not dep but upset with her son.  No severe panic attacks.  Best very frustrated with her binge eating at this time but feels the stress at home is precipitating it.  Switched Ativan to Xanax and been OK with that.  Not sure re: differences except sleeps  better ususally.    Tremor manageable. Plan:   Change to 1 mg Xanax DT QL.   Re: RLS is still on unusual med combination for the following reason: Pramipexole only interfered with sleep. So stopped. Ropinirole only was too fatiguing.  Failed iron trials for RLS Trial higher gabapentin 600 mg 2 twice daily for RLS.  Might also help anxiety. Caffeine 1 diet Coke or less.    11/24/19 appt noted: A lot of stress.  2 panic attacks.  Never been so busy and stressed as now.  Menopause.  Mind racing at night and not resting well. Had stopped naltrexone and ER pramipexole bc felt it was  sedating. RLS is still a problem and reduced ropinirole and pramipexole to deal with sedative SE. Wishes she slept better.  CO EMA usually not due to RLS.  Feels RLS more in hands. Sleep 4-5 hours average. Tries to do too much and trouble finishing things.  Son has ADD and wonders if that's part of her problem. Plan: no med changes  06/05/2020 appointment with the following noted: Doing OK. Covid December 2021.  Can still struggle with RLS in legs and hands. Not taking propranolol much. Asks to increase pramipexole for RLS to  0.375 mg in addition to Ropinirole 2 mg PM. Limited to 1 Coke Zero as late as late afternoon. Wonders about ADD. Not binge eating. History palpitations with cardiology appt next week. Some tremor and wonders about reducing Wellbutrin bc no problems with depression lately. Considering ADD tx.  Defer. Anxiety manageable. Plan: Re: RLS is still on unusual med combination for the following reason: Pramipexole only interfered with sleep. Ropinirole only was too fatiguing. OK to use combo if necessary as previously required. Ropinirole 2 mg PM with pramipexole 0.375 mg PM Stop afternoon caffeine. Disc insomnia trazodone vs Sonata.   Reduce Wellbutrin to see if tremor is better.  12/12/20 appt noted: Caffeine contributes to tremor.  Not sure problem with less Wellbutrin.  Always mild depression. Still active. Above dosing and still had terrrible RLS and trouble sleeping.  With Ropinirole alone it helped some.  Spreading out ropinirole helped.  Pramipexole didn't help. No SE except some fatigue.  Split ropinirole helped that and trazodone helped. Rare anxiety in middle of sports activity.  Cleared by cardiology.  Anxiety managed. Still wonders about ADD bc trouble finishing things.  So many things she doesn't finish.  Frustrated.  H notices.  Wants to try ADD tx. Feels needs AM Xanax bc gets demands of day stressed. Plan: Wean AM Xanax and start concerta for ADD 36  mg AM  02/19/2021 appt noted: Not much benefit noted with Concerta after the first couple of days.   Took it off and on over the holidays. Only Xanax at night. MorE  RLS lately and taking 2 ropinirole 1 mg  usually but increased to 3 in PM. Asks about increasin gConcerta.  No SE Palpitations not worse.   Dx mild - Moderate OSA ordered CPAP but back log. Plan: Increase Concerta 54 mg daily Continue ropinirole 2 to 3 mg in the evening as needed for restless legs Continue sertraline 150 mg daily Continue trazodone 150 to 100 mg nightly as needed Continue Xanax 0.5 mg 3 times daily as needed anxiety or insomnia  05/15/21 appt noted: She thinks it was ok to increase Concerta 54 mg AM.  After a short time doesn't notice much difference.  Initially noticed a change but didn't thereafter.  Skips weekends.  No sig  withdrawal on the weekends.  Constantly moving.   Not real impressed with benefit Concerta.  In the beginning was more productive and efficient.   Sleep pretty good.  Ropinirole and trazodone help with 6-7 hours of sleep.  Less awakening.   No SE.  No sig Xanax during the day.  For the most part anxiety is manageable.  Stress H lost his job and a lot on her plate but is OK with anxiety usually.  No sig panic. Plan: Switch Concerta 54 to Vyvanse 50 mg AM  07/30/2021 appointment the following noted: Busy time at work.  Works for [employer] Concerta 54 was highest dose and limited response. Can't tell a big difference with Vyvanse and Concerta. A little anxious lately but has some stress. Overwhelmed at times.  Near panic a couple of times. Not depressed. Hates RLS.  Hard to manage it at times. Sleep 4-7 hours.  Using CPAP trying masks starting in Feb and use off and on. Plan: Wellbutrin XL 150 mg AM LED bc stimulants. CONTINUE  Sertraline 100 per her request. Increase Vyvanse to 60 mg AM Continue ropinirole 3 mg PM and trazodone 50-100 mg HS  12/16/2021 appointment noted: Problems with  insurance DT H losing his job and change to her insurance.  Insurance made mistakes.   Disc shortage of vyvanse.  Saw more benefit with increase Vyvanse 60 mg with initial jitteriness.  Helps her productivity.    Not jitery most days. This time of year is busiest with work and activities.   Sleep is OK but worst RLS lately.  Not much caffeine but some Coke Zero.    Stress H with Leukemia and recent hospitalization for heart problems.  Past Psychiatric Medication Trials:  Fluoxetine, Wellbutrin 450 no wt loss,  venlafaxine, sertraline 300, Lexapro 30, fluvoxamine, buspirone,  clonazepam, lorazepam, Xanax 0.5, trazodone 50-100mg  HS Abilify,   Concerta 54 limited effect Vyvanse 50  Vistaril, propranolol, Mirapex, naltrexone nausea  topiramate 200 fatigue, Pramipexole only interfered with sleep. Pramipexole ER Ropinirole only was too fatiguing.   So hx taking the mixture of the 2 for severe RLS Failed iron trials for RLS Gabapentin 600 Naltrexone excessive weakness.  Review of Systems:  Review of Systems  HENT:  Negative for congestion.   Cardiovascular:  Positive for palpitations. Negative for chest pain.  Neurological:  Negative for tremors.  Psychiatric/Behavioral:  Positive for decreased concentration and sleep disturbance. Negative for agitation, behavioral problems, confusion, dysphoric mood, hallucinations and self-injury. The patient is not nervous/anxious and is not hyperactive.     Medications: I have reviewed the patient's current medications.  Current Outpatient Medications  Medication Sig Dispense Refill   albuterol (VENTOLIN HFA) 108 (90 Base) MCG/ACT inhaler      buPROPion (WELLBUTRIN XL) 150 MG 24 hr tablet TAKE 1 TABLET BY MOUTH EVERY DAY 90 tablet 0   estradiol (ESTRACE) 2 MG tablet Take 2 mg by mouth daily.     lisdexamfetamine (VYVANSE) 60 MG capsule Take 1 capsule (60 mg total) by mouth daily. 30 capsule 0   lisdexamfetamine (VYVANSE) 60 MG capsule Take 1  capsule (60 mg total) by mouth daily. 30 capsule 0   progesterone (PROMETRIUM) 100 MG capsule Take 100 mg by mouth daily.     rOPINIRole (REQUIP) 1 MG tablet TAKE 3 TABLETS BY MOUTH AT BEDTIME 90 tablet 0   sertraline (ZOLOFT) 100 MG tablet TAKE ONE AND ONE-HALF TABLETS DAILY (Patient taking differently: 1 daily) 135 tablet 3   traZODone (DESYREL) 50  MG tablet TAKE 1 TO 2 TABLETS AT BEDTIME 180 tablet 3   No current facility-administered medications for this visit.    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   Vertigo     Family History  Problem Relation Age of Onset   Hypertension Mother    Hypertension Father    Tremor Sister    Heart attack Neg Hx    Diabetes Neg Hx    Colon cancer Neg Hx    Colon polyps Neg Hx    Stomach cancer Neg Hx    Esophageal cancer Neg Hx     Social History   Socioeconomic History   Marital status: Married    Spouse name: Not on file   Number of children: 3   Years of education: college   Highest education level: Not on file  Occupational History   Not on file  Tobacco Use   Smoking status: Never   Smokeless tobacco: Never  Vaping Use   Vaping Use: Never used  Substance and Sexual Activity   Alcohol use: Yes    Alcohol/week: 1.0 standard drink of alcohol    Types: 1 Cans of beer per week    Comment: occasionaly   Drug use: Never   Sexual activity: Not on file  Other Topics Concern   Not on file  Social History Narrative   Patient is right handed.   Patient drinks 1 cup caffeine daily.   Social Determinants of Health   Financial Resource Strain: Not on file  Food Insecurity: Not on file  Transportation Needs: Not on file  Physical Activity: Not on file  Stress: Not on file  Social Connections: Not on file  Intimate  Partner Violence: Not on file    Past Medical History, Surgical history, Social history, and Family history were reviewed and updated as appropriate.   Please see review of systems for further details on the patient's review from today.   Objective:   Physical Exam:  BP 127/85   Pulse 84   Physical Exam Constitutional:      General: She is not in acute distress. Musculoskeletal:        General: No deformity.  Neurological:     Mental Status: She is alert and oriented to person, place, and time.     Cranial Nerves: No dysarthria.     Coordination: Coordination normal.  Psychiatric:        Attention and Perception: Perception normal. She is inattentive. She does not perceive auditory or visual hallucinations.        Mood and Affect: Mood is anxious. Mood is not depressed. Affect is not labile, blunt, angry or inappropriate.        Speech: Speech normal.        Behavior: Behavior normal. Behavior is cooperative.        Thought Content: Thought content normal. Thought content is not paranoid or delusional. Thought content does not include homicidal or suicidal ideation. Thought content does not include suicidal plan.        Cognition and Memory: Cognition and memory normal.        Judgment: Judgment normal.     Comments: Insight pretty good. She is chronically anxious it is never been completely controlled but  is better Residual depression not severe     Lab Review:     Component Value Date/Time   NA 137 06/18/2018 1901   K 3.4 (L) 06/18/2018 1901   CL 104 06/18/2018 1901   CO2 24 06/18/2018 1901   GLUCOSE 107 (H) 06/18/2018 1901   BUN 16 06/18/2018 1901   CREATININE 0.96 06/18/2018 1901   CALCIUM 8.8 (L) 06/18/2018 1901   GFRNONAA >60 06/18/2018 1901   GFRAA >60 06/18/2018 1901       Component Value Date/Time   WBC 12.4 (H) 06/18/2018 1901   RBC 4.43 06/18/2018 1901   HGB 13.4 06/18/2018 1901   HCT 41.0 06/18/2018 1901   PLT 228 06/18/2018 1901   MCV 92.6  06/18/2018 1901   MCH 30.2 06/18/2018 1901   MCHC 32.7 06/18/2018 1901   RDW 13.4 06/18/2018 1901    No results found for: "POCLITH", "LITHIUM"   No results found for: "PHENYTOIN", "PHENOBARB", "VALPROATE", "CBMZ"   .res Assessment: Plan:    Generalized anxiety disorder  Panic disorder with agoraphobia  Attention deficit hyperactivity disorder (ADHD), combined type  Restless legs syndrome  Insomnia due to mental condition  Tremor, essential  Moderate obstructive sleep apnea-hypopnea syndrome  Binge-eating disorder, moderate  Greater than 50% of 30 min face to face time with patient was spent on counseling and coordination of care. We discussed the following:  Disc various dx and status of current med regimen.  depression and anxiety are managed.  RLS is not great.   Binge eating managed.  Wellbutrin XL 150 mg AM LED bc stimulants. CONTINUE  Sertraline 100 per her request.  1 mg Xanax DT QL.  We discussed the short-term risks associated with benzodiazepines including sedation and increased fall risk among others.  Discussed long-term side effect risk including dependence, potential withdrawal symptoms, and the potential eventual dose-related risk of dementia. Disc new studies refuting the link.  Pramipexole not very effective.  Tolerating ropinirole better by splitting the dosage. Disc timing of meds.  Disc options but have failed multiple ones. Disc dosing and risk of going to high in dose and causing RLS.  Nevertheless she feels need to increase it. Needing increase Ropinirole 3 mg - 4 mg PM  Stop afternoon caffeine.  Disc insomnia trazodone helpful with Xanax 0.5  Disc mental health ris of untreated OSA.  Extensive discussion of adjustments and masks.  Disc her questions about ADD.  High inattentive score on questionaire. Push fluids.   continue Vyvanse 60 mg AM Discussed potential benefits, risks, and side effects of stimulants with patient to include increased  heart rate, palpitations, insomnia, increased anxiety, increased irritability, or decreased appetite.  Instructed patient to contact office if experiencing any significant tolerability issues. Disc difference between brand, generics.  Disc this in depth.  Call if worsening sx  30 min appt.  FU 2 mos.  Lynder Parents, MD, DFAPA   Please see After Visit Summary for patient specific instructions.  No future appointments.   No orders of the defined types were placed in this encounter.      -------------------------------

## 2021-12-31 ENCOUNTER — Telehealth: Payer: Self-pay | Admitting: Psychiatry

## 2021-12-31 NOTE — Telephone Encounter (Signed)
At OV with CC on 10/30 a 90-day supply was sent to [facility]. Called patient to ask if she was going to use that RF.

## 2021-12-31 NOTE — Telephone Encounter (Signed)
Patient lvm at 3:24 for refill on Vyvanse 60mg . Ph: [phone] Appt 2/14 [facility] [address] [place]

## 2022-01-01 ENCOUNTER — Other Ambulatory Visit: Payer: Self-pay

## 2022-01-01 DIAGNOSIS — F902 Attention-deficit hyperactivity disorder, combined type: Secondary | ICD-10-CM

## 2022-01-01 NOTE — Telephone Encounter (Signed)
Left another VM to RC.  

## 2022-01-01 NOTE — Telephone Encounter (Signed)
Patient said [facility] not filling the Vyvanse. Will cancel and pend to [facility] as requested.

## 2022-01-02 MED ORDER — LISDEXAMFETAMINE DIMESYLATE 60 MG PO CAPS: 60.0000 mg | ORAL_CAPSULE | Freq: Every day | ORAL | 0 refills | Status: DC

## 2022-01-09 ENCOUNTER — Telehealth: Payer: Self-pay | Admitting: Psychiatry

## 2022-01-09 DIAGNOSIS — G2581 Restless legs syndrome: Secondary | ICD-10-CM

## 2022-01-13 MED ORDER — ROPINIROLE HCL 1 MG PO TABS: 4.0000 mg | ORAL_TABLET | Freq: Every day | ORAL | 2 refills | Status: DC

## 2022-01-13 NOTE — Telephone Encounter (Signed)
An earlier Rx had been sent to [facility], so RF request was denied.  [facility] per request. Two Rx already pending for V[facility]vanse at [facility].

## 2022-01-13 NOTE — Telephone Encounter (Signed)
PT is requesting the Ropinerole/Requip. Not sure why it was denied because she takes it. Also needs to get Vyvanse and [facility]  [id number] [place] [place] - [address]  [address], [place] [address]  Do not use [facility]

## 2022-01-17 ENCOUNTER — Other Ambulatory Visit: Payer: Self-pay | Admitting: Psychiatry

## 2022-01-17 ENCOUNTER — Telehealth: Payer: Self-pay | Admitting: Psychiatry

## 2022-01-17 DIAGNOSIS — F902 Attention-deficit hyperactivity disorder, combined type: Secondary | ICD-10-CM

## 2022-01-17 MED ORDER — LISDEXAMFETAMINE DIMESYLATE 60 MG PO CAPS: 60.0000 mg | ORAL_CAPSULE | Freq: Every day | ORAL | 0 refills | Status: DC

## 2022-01-17 NOTE — Telephone Encounter (Signed)
[facility]. Please send to [facility] [address] - I called and was told "they have it at the moment" so she can get it filled. Thanks.

## 2022-01-17 NOTE — Telephone Encounter (Signed)
FYI RX sent

## 2022-01-21 DIAGNOSIS — D2272 Melanocytic nevi of left lower limb, including hip: Secondary | ICD-10-CM | POA: Diagnosis not present

## 2022-01-21 DIAGNOSIS — D2262 Melanocytic nevi of left upper limb, including shoulder: Secondary | ICD-10-CM | POA: Diagnosis not present

## 2022-01-21 DIAGNOSIS — D485 Neoplasm of uncertain behavior of skin: Secondary | ICD-10-CM | POA: Diagnosis not present

## 2022-01-21 DIAGNOSIS — L821 Other seborrheic keratosis: Secondary | ICD-10-CM | POA: Diagnosis not present

## 2022-01-21 DIAGNOSIS — D225 Melanocytic nevi of trunk: Secondary | ICD-10-CM | POA: Diagnosis not present

## 2022-01-21 DIAGNOSIS — L738 Other specified follicular disorders: Secondary | ICD-10-CM | POA: Diagnosis not present

## 2022-01-21 NOTE — Telephone Encounter (Signed)
Left message on VM. Vyvanse is generic now and it looks like she got brand. Told her to ask for generic.

## 2022-01-21 NOTE — Telephone Encounter (Signed)
Pt LVM @ 9:50a.  She is asking if there is a discount code/card for Vyvanse? She said it's expensive.   Next appt 2/14

## 2022-02-14 ENCOUNTER — Telehealth: Payer: Self-pay | Admitting: Psychiatry

## 2022-02-14 DIAGNOSIS — F4001 Agoraphobia with panic disorder: Secondary | ICD-10-CM

## 2022-02-14 DIAGNOSIS — F411 Generalized anxiety disorder: Secondary | ICD-10-CM

## 2022-02-14 MED ORDER — SERTRALINE HCL 100 MG PO TABS: 150.0000 mg | ORAL_TABLET | Freq: Every day | ORAL | 0 refills | Status: DC

## 2022-02-14 NOTE — Telephone Encounter (Signed)
Next visit is 04/02/22. Requesting refill on Zoloft and hoping to get 90 days sent to   [id number] [place], [place] - [address]   Phone: [phone]  Fax: [phone]

## 2022-02-14 NOTE — Telephone Encounter (Signed)
Rx sent 

## 2022-03-28 ENCOUNTER — Other Ambulatory Visit: Payer: Self-pay

## 2022-03-28 ENCOUNTER — Telehealth: Payer: Self-pay | Admitting: Psychiatry

## 2022-03-28 NOTE — Telephone Encounter (Signed)
Pt has a RF available from January per chart.

## 2022-03-28 NOTE — Telephone Encounter (Signed)
Called pharmacy to verify RF available. They do have 2 RF, but not enough stock. Patient notified.

## 2022-03-28 NOTE — Telephone Encounter (Signed)
Pamela Little called and Pamela Little at 2:58 to request refill of  her Vyvanse.  No appt because we cancelled her appt next week since Dr. Clovis Pu will be out of the office.  We have Pamela Little for her to call to RS.  [facility] [address]

## 2022-04-02 ENCOUNTER — Ambulatory Visit: Payer: BC Managed Care – PPO | Admitting: Psychiatry

## 2022-04-09 ENCOUNTER — Other Ambulatory Visit: Payer: Self-pay | Admitting: Psychiatry

## 2022-04-09 DIAGNOSIS — G2581 Restless legs syndrome: Secondary | ICD-10-CM

## 2022-04-17 ENCOUNTER — Other Ambulatory Visit: Payer: Self-pay | Admitting: Psychiatry

## 2022-04-17 ENCOUNTER — Telehealth: Payer: Self-pay | Admitting: Psychiatry

## 2022-04-17 DIAGNOSIS — G2581 Restless legs syndrome: Secondary | ICD-10-CM

## 2022-04-17 DIAGNOSIS — F411 Generalized anxiety disorder: Secondary | ICD-10-CM

## 2022-04-17 DIAGNOSIS — F4001 Agoraphobia with panic disorder: Secondary | ICD-10-CM

## 2022-04-17 DIAGNOSIS — F902 Attention-deficit hyperactivity disorder, combined type: Secondary | ICD-10-CM

## 2022-04-17 DIAGNOSIS — F5105 Insomnia due to other mental disorder: Secondary | ICD-10-CM

## 2022-04-17 MED ORDER — LISDEXAMFETAMINE DIMESYLATE 60 MG PO CAPS: 60.0000 mg | ORAL_CAPSULE | Freq: Every day | ORAL | 0 refills | Status: DC

## 2022-04-17 NOTE — Telephone Encounter (Signed)
Pt LVM @ 8:16a.  She has found Vyvanse at [place].  Pls send in script.  Next appt 4/4

## 2022-04-17 NOTE — Telephone Encounter (Signed)
Pended.

## 2022-05-22 ENCOUNTER — Ambulatory Visit (INDEPENDENT_AMBULATORY_CARE_PROVIDER_SITE_OTHER): Payer: BC Managed Care – PPO | Admitting: Psychiatry

## 2022-05-22 ENCOUNTER — Encounter: Payer: Self-pay | Admitting: Psychiatry

## 2022-05-22 ENCOUNTER — Other Ambulatory Visit (HOSPITAL_COMMUNITY): Payer: Self-pay

## 2022-05-22 DIAGNOSIS — F5105 Insomnia due to other mental disorder: Secondary | ICD-10-CM

## 2022-05-22 DIAGNOSIS — F4001 Agoraphobia with panic disorder: Secondary | ICD-10-CM

## 2022-05-22 DIAGNOSIS — F411 Generalized anxiety disorder: Secondary | ICD-10-CM | POA: Diagnosis not present

## 2022-05-22 DIAGNOSIS — G2581 Restless legs syndrome: Secondary | ICD-10-CM

## 2022-05-22 DIAGNOSIS — F5081 Binge eating disorder: Secondary | ICD-10-CM

## 2022-05-22 DIAGNOSIS — G250 Essential tremor: Secondary | ICD-10-CM

## 2022-05-22 DIAGNOSIS — F902 Attention-deficit hyperactivity disorder, combined type: Secondary | ICD-10-CM | POA: Diagnosis not present

## 2022-05-22 DIAGNOSIS — G4733 Obstructive sleep apnea (adult) (pediatric): Secondary | ICD-10-CM

## 2022-05-22 MED ORDER — LISDEXAMFETAMINE DIMESYLATE 60 MG PO CAPS
60.0000 mg | ORAL_CAPSULE | Freq: Every day | ORAL | 0 refills | Status: DC
  Filled 2022-06-23: qty 30, 30d supply, fill #0

## 2022-05-22 MED ORDER — LISDEXAMFETAMINE DIMESYLATE 60 MG PO CAPS
60.0000 mg | ORAL_CAPSULE | Freq: Every day | ORAL | 0 refills | Status: DC
  Filled 2022-05-22: qty 30, 30d supply, fill #0

## 2022-05-22 NOTE — Progress Notes (Signed)
Pamela Little [id number] [birth year] 52 y.o.    Subjective:   Patient ID:  Pamela Little is a 52 y.o. (DOB [birth year]) female.  Chief Complaint:  Chief Complaint  Patient presents with   Follow-up   Depression   Anxiety   Sleeping Problem    Anxiety Symptoms include decreased concentration and palpitations. Patient reports no chest pain, confusion or nervous/anxious behavior.    Medication Refill Pertinent negatives include no chest pain or congestion.   Pamela Little presents to the office today for follow-up ofweight concerns and pharmacy problems and FU anxiety.  When seen May 2020.  She was having insomnia as we increased the Xanax to 0.5 mg twice daily and 1 mg nightly.  She continued Wellbutrin XL 300 mg every morning, sertraline 150, ropinirole for restless legs, propranolol as needed for anxiety, naltrexone for weight gain. Also taking pramipexole ER.     seen September.  Following change: DT insomnia increase Xanax 0.5 mg BID and 1mg  HS.  seen February 16, 2019.  The following was noted: Hanging in there.  Quit taking Naltrexone and pramipexole ER 3mg  .  After tennis would feel unusually fatigued and like she might pass out. More RLS in the afternoon without the pramipexole  The following medical decisions were made: Re: RLS is still on unusual med combination for the following reason: Pramipexole only interfered with sleep.  So stopped. Ropinirole only was too fatiguing. So taking the mixture of the 2 for severe RLS Failed iron trials for RLS Trial gabapentin 300 mg 2 twice daily for RLS.  04/2019 appt noted: She does not feel the gabapentin does much so still taking the ropinirole. No SE.   Milder RLS.   Since the higher dosage of Wellbutrin did not help with weight will leave at 300mg  daily.  May retry higherdose once son is placed.  Consider phentermine. Has been told she's in menopause and is having sweating and hot flashes.  Sleep comes and goes and is not  consistent.  Overall mentally OK for the most part.  Definitely anxious.  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  Not depressed.   Can be chronically tired.  Always upset with her weight.  Working normal 40 + hours.  Chronically stressed. Has been a little anxious and been stressed with H sick with leukemia.  Son 57 yo still driving them nuts and not doing school.  Still struggling big time with him.  That will make her weepy.     Definitely anxious.  Struggles with weight.  Plans to see GYN  Trouble staying asleep and 5 hours average, not enough.  Some awakening with anxiety and worrying over son being out and whether he's safe.  Typical Xanax 0.5mg  TID Took Wellb 450 am for awhile and did not lose weight or see any benefit or SE.  Emotional eater and stressed by son's mental health and substance abuse problems and refusing school.  Very angry and defiant.  Anticipates placement somewhere.  Drinking and pot.  Chronically anxious.  Hard on the whole family.  Refusing church.  Pt doesn't think changing her med will help.  I keep eating.  Not ususal for her.  Not dep but upset with her son.  No severe panic attacks.  Best very frustrated with her binge eating at this time but feels the stress at home is precipitating it.  Switched Ativan to Xanax and been OK with that.  Not sure re: differences except sleeps  better ususally.    Tremor manageable. Plan:   Change to 1 mg Xanax DT QL.   Re: RLS is still on unusual med combination for the following reason: Pramipexole only interfered with sleep. So stopped. Ropinirole only was too fatiguing.  Failed iron trials for RLS Trial higher gabapentin 600 mg 2 twice daily for RLS.  Might also help anxiety. Caffeine 1 diet Coke or less.    11/24/19 appt noted: A lot of stress.  2 panic attacks.  Never been so busy and stressed as now.  Menopause.  Mind racing at night and not resting well. Had stopped naltrexone and ER pramipexole bc felt it was  sedating. RLS is still a problem and reduced ropinirole and pramipexole to deal with sedative SE. Wishes she slept better.  CO EMA usually not due to RLS.  Feels RLS more in hands. Sleep 4-5 hours average. Tries to do too much and trouble finishing things.  Son has ADD and wonders if that's part of her problem. Plan: no med changes  06/05/2020 appointment with the following noted: Doing OK. Covid December 2021.  Can still struggle with RLS in legs and hands. Not taking propranolol much. Asks to increase pramipexole for RLS to  0.375 mg in addition to Ropinirole 2 mg PM. Limited to 1 Coke Zero as late as late afternoon. Wonders about ADD. Not binge eating. History palpitations with cardiology appt next week. Some tremor and wonders about reducing Wellbutrin bc no problems with depression lately. Considering ADD tx.  Defer. Anxiety manageable. Plan: Re: RLS is still on unusual med combination for the following reason: Pramipexole only interfered with sleep. Ropinirole only was too fatiguing. OK to use combo if necessary as previously required. Ropinirole 2 mg PM with pramipexole 0.375 mg PM Stop afternoon caffeine. Disc insomnia trazodone vs Sonata.   Reduce Wellbutrin to see if tremor is better.  12/12/20 appt noted: Caffeine contributes to tremor.  Not sure problem with less Wellbutrin.  Always mild depression. Still active. Above dosing and still had terrrible RLS and trouble sleeping.  With Ropinirole alone it helped some.  Spreading out ropinirole helped.  Pramipexole didn't help. No SE except some fatigue.  Split ropinirole helped that and trazodone helped. Rare anxiety in middle of sports activity.  Cleared by cardiology.  Anxiety managed. Still wonders about ADD bc trouble finishing things.  So many things she doesn't finish.  Frustrated.  H notices.  Wants to try ADD tx. Feels needs AM Xanax bc gets demands of day stressed. Plan: Wean AM Xanax and start concerta for ADD 36  mg AM  02/19/2021 appt noted: Not much benefit noted with Concerta after the first couple of days.   Took it off and on over the holidays. Only Xanax at night. MorE  RLS lately and taking 2 ropinirole 1 mg  usually but increased to 3 in PM. Asks about increasin gConcerta.  No SE Palpitations not worse.   Dx mild - Moderate OSA ordered CPAP but back log. Plan: Increase Concerta 54 mg daily Continue ropinirole 2 to 3 mg in the evening as needed for restless legs Continue sertraline 150 mg daily Continue trazodone 150 to 100 mg nightly as needed Continue Xanax 0.5 mg 3 times daily as needed anxiety or insomnia  05/15/21 appt noted: She thinks it was ok to increase Concerta 54 mg AM.  After a short time doesn't notice much difference.  Initially noticed a change but didn't thereafter.  Skips weekends.  No sig  withdrawal on the weekends.  Constantly moving.   Not real impressed with benefit Concerta.  In the beginning was more productive and efficient.   Sleep pretty good.  Ropinirole and trazodone help with 6-7 hours of sleep.  Less awakening.   No SE.  No sig Xanax during the day.  For the most part anxiety is manageable.  Stress H lost his job and a lot on her plate but is OK with anxiety usually.  No sig panic. Plan: Switch Concerta 54 to Vyvanse 50 mg AM  07/30/2021 appointment the following noted: Busy time at work.  Works for [employer] Concerta 54 was highest dose and limited response. Can't tell a big difference with Vyvanse and Concerta. A little anxious lately but has some stress. Overwhelmed at times.  Near panic a couple of times. Not depressed. Hates RLS.  Hard to manage it at times. Sleep 4-7 hours.  Using CPAP trying masks starting in Feb and use off and on. Plan: Wellbutrin XL 150 mg AM LED bc stimulants. CONTINUE  Sertraline 100 per her request. Increase Vyvanse to 60 mg AM Continue ropinirole 3 mg PM and trazodone 50-100 mg HS  12/16/2021 appointment noted: Problems with  insurance DT H losing his job and change to her insurance.  Insurance made mistakes.   Disc shortage of vyvanse.  Saw more benefit with increase Vyvanse 60 mg with initial jitteriness.  Helps her productivity.    Not jitery most days. This time of year is busiest with work and activities.   Sleep is OK but worst RLS lately.  Not much caffeine but some Coke Zero.   05/22/22 appt: Change generic ropinirole and got dizzy and wiped out.  But this mainly seemed to be only after playing tennis. Hard time getting Vyvanse.  Disc this at length.  Has been without it for weeks at a time. On Vyvanse 60 am better mood focus and eating behavior Was in poor shape without it.  Couldn't get started and complete things. Residual depression and anxiety. Gritting teeth.   Stress H with Leukemia and recent hospitalization for heart problems.  Past Psychiatric Medication Trials:  Fluoxetine, Wellbutrin 450 no wt loss,  venlafaxine, sertraline 300, Lexapro 30, fluvoxamine, buspirone,  clonazepam, lorazepam, Xanax 0.5, trazodone 50-100mg  HS Abilify,   Concerta 54 limited effect Vyvanse 60  Vistaril, propranolol, Mirapex, naltrexone nausea  topiramate 200 fatigue, Pramipexole only interfered with sleep. Pramipexole ER Ropinirole only was too fatiguing.   So hx taking the mixture of the 2 for severe RLS Failed iron trials for RLS Gabapentin 600 Naltrexone excessive weakness.  Review of Systems:  Review of Systems  HENT:  Negative for congestion.   Cardiovascular:  Positive for palpitations. Negative for chest pain.  Psychiatric/Behavioral:  Positive for decreased concentration and sleep disturbance. Negative for agitation, behavioral problems, confusion, dysphoric mood, hallucinations and self-injury. The patient is not nervous/anxious and is not hyperactive.     Medications: I have reviewed the patient's current medications.  Current Outpatient Medications  Medication Sig Dispense Refill    albuterol (VENTOLIN HFA) 108 (90 Base) MCG/ACT inhaler      buPROPion (WELLBUTRIN XL) 150 MG 24 hr tablet TAKE 1 TABLET BY MOUTH EVERY DAY 90 tablet 0   estradiol (ESTRACE) 2 MG tablet Take 2 mg by mouth daily.     lisdexamfetamine (VYVANSE) 60 MG capsule Take 1 capsule (60 mg total) by mouth daily. 30 capsule 0   progesterone (PROMETRIUM) 100 MG capsule Take 100 mg by mouth  daily.     rOPINIRole (REQUIP) 1 MG tablet TAKE 4 TABLETS BY MOUTH AT BEDTIME 240 tablet 0   sertraline (ZOLOFT) 100 MG tablet TAKE 1.5 TABLETS (150MG  TOTAL) BY MOUTH DAILY (Patient taking differently: Take 100 mg by mouth daily.) 135 tablet 0   traZODone (DESYREL) 50 MG tablet TAKE 1 TO 2 TABLETS BY MOUTH AT BEDTIME (Patient taking differently: Take 50 mg by mouth at bedtime.) 180 tablet 0   lisdexamfetamine (VYVANSE) 60 MG capsule Take 1 capsule (60 mg total) by mouth daily. 30 capsule 0   [START ON 06/19/2022] lisdexamfetamine (VYVANSE) 60 MG capsule Take 1 capsule (60 mg total) by mouth daily. 30 capsule 0   No current facility-administered medications for this visit.    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   Vertigo     Family History  Problem Relation Age of Onset   Hypertension Mother    Hypertension Father    Tremor Sister    Heart attack Neg Hx    Diabetes Neg Hx    Colon cancer Neg Hx    Colon polyps Neg Hx    Stomach cancer Neg Hx    Esophageal cancer Neg Hx     Social History   Socioeconomic History   Marital status: Married    Spouse name: Not on file   Number of children: 3   Years of education: college   Highest education level: Not on file  Occupational History   Not on file  Tobacco Use   Smoking status: Never   Smokeless tobacco: Never  Vaping Use   Vaping Use: Never used   Substance and Sexual Activity   Alcohol use: Yes    Alcohol/week: 1.0 standard drink of alcohol    Types: 1 Cans of beer per week    Comment: occasionaly   Drug use: Never   Sexual activity: Not on file  Other Topics Concern   Not on file  Social History Narrative   Patient is right handed.   Patient drinks 1 cup caffeine daily.   Social Determinants of Health   Financial Resource Strain: Not on file  Food Insecurity: Not on file  Transportation Needs: Not on file  Physical Activity: Not on file  Stress: Not on file  Social Connections: Not on file  Intimate Partner Violence: Not on file    Past Medical History, Surgical history, Social history, and Family history were reviewed and updated as appropriate.   Please see review of systems for further details on the patient's review from today.   Objective:   Physical Exam:  There were no vitals taken for this visit.  Physical Exam Constitutional:      General: She is not in acute distress. Musculoskeletal:        General: No deformity.  Neurological:     Mental Status: She is alert and oriented to person, place, and time.     Cranial Nerves: No dysarthria.     Coordination: Coordination normal.  Psychiatric:        Attention and Perception: Perception normal. She is inattentive. She does not perceive auditory or visual hallucinations.        Mood and Affect: Mood is anxious. Mood is not depressed. Affect is not  labile, blunt, angry or inappropriate.        Speech: Speech normal.        Behavior: Behavior normal. Behavior is cooperative.        Thought Content: Thought content normal. Thought content is not paranoid or delusional. Thought content does not include homicidal or suicidal ideation. Thought content does not include suicidal plan.        Cognition and Memory: Cognition and memory normal.        Judgment: Judgment normal.     Comments: Insight pretty good. She is chronically anxious it is never been  completely controlled but is better Residual depression not severe     Lab Review:     Component Value Date/Time   NA 137 06/18/2018 1901   K 3.4 (L) 06/18/2018 1901   CL 104 06/18/2018 1901   CO2 24 06/18/2018 1901   GLUCOSE 107 (H) 06/18/2018 1901   BUN 16 06/18/2018 1901   CREATININE 0.96 06/18/2018 1901   CALCIUM 8.8 (L) 06/18/2018 1901   GFRNONAA >60 06/18/2018 1901   GFRAA >60 06/18/2018 1901       Component Value Date/Time   WBC 12.4 (H) 06/18/2018 1901   RBC 4.43 06/18/2018 1901   HGB 13.4 06/18/2018 1901   HCT 41.0 06/18/2018 1901   PLT 228 06/18/2018 1901   MCV 92.6 06/18/2018 1901   MCH 30.2 06/18/2018 1901   MCHC 32.7 06/18/2018 1901   RDW 13.4 06/18/2018 1901    No results found for: "POCLITH", "LITHIUM"   No results found for: "PHENYTOIN", "PHENOBARB", "VALPROATE", "CBMZ"   .res Assessment: Plan:    Generalized anxiety disorder  Panic disorder with agoraphobia  Attention deficit hyperactivity disorder (ADHD), combined type - Plan: lisdexamfetamine (VYVANSE) 60 MG capsule, lisdexamfetamine (VYVANSE) 60 MG capsule  Restless legs syndrome  Insomnia due to mental condition  Tremor, essential  Moderate obstructive sleep apnea-hypopnea syndrome  Binge-eating disorder, moderate  Greater than 50% of 30 min face to face time with patient was spent on counseling and coordination of care. We discussed the following:  Disc various dx and status of current med regimen.  depression and anxiety are managed.  RLS is not great.   Binge eating managed.  Wellbutrin XL 150 mg AM LED bc stimulants.  Consisder alternative Auvelity CONTINUE  Sertraline 100 per her request.  1 mg Xanax DT QL.  We discussed the short-term risks associated with benzodiazepines including sedation and increased fall risk among others.  Discussed long-term side effect risk including dependence, potential withdrawal symptoms, and the potential eventual dose-related risk of dementia.  Disc new studies refuting the link.  Pramipexole not very effective.  Tolerating ropinirole better by splitting the dosage. Disc timing of meds.  Disc options but have failed multiple ones. Disc dosing and risk of going to high in dose and causing RLS.  Nevertheless she feels need to increase it. Needing increase Ropinirole 3 mg - 4 mg PM  Stop afternoon caffeine. Reduced Coffee to 1 daily and Coke Zero 1-2 daily  Disc insomnia trazodone helpful with Xanax 0.5  Disc mental health ris of untreated OSA.  Extensive discussion of adjustments and masks.  Disc her questions about ADD.  High inattentive score on questionaire. Push fluids.   continue Vyvanse 60 mg AM Discussed potential benefits, risks, and side effects of stimulants with patient to include increased heart rate, palpitations, insomnia, increased anxiety, increased irritability, or decreased appetite.  Instructed patient to contact office if experiencing any significant tolerability  issues. Disc difference between brand, generics.  Disc this in depth.  Call if worsening sx  30 min appt.  FU 3 mos.  Lynder Parents, MD, DFAPA   Please see After Visit Summary for patient specific instructions.  No future appointments.   No orders of the defined types were placed in this encounter.      -------------------------------

## 2022-05-22 NOTE — Patient Instructions (Addendum)
Other option Adzenys instead of Vyvanse Option Auvelity in place of Wellbutrin

## 2022-05-23 ENCOUNTER — Other Ambulatory Visit (HOSPITAL_COMMUNITY): Payer: Self-pay

## 2022-06-23 ENCOUNTER — Other Ambulatory Visit (HOSPITAL_COMMUNITY): Payer: Self-pay

## 2022-06-30 DIAGNOSIS — Z13220 Encounter for screening for lipoid disorders: Secondary | ICD-10-CM | POA: Diagnosis not present

## 2022-06-30 DIAGNOSIS — J4520 Mild intermittent asthma, uncomplicated: Secondary | ICD-10-CM | POA: Diagnosis not present

## 2022-06-30 DIAGNOSIS — Z0000 Encounter for general adult medical examination without abnormal findings: Secondary | ICD-10-CM | POA: Diagnosis not present

## 2022-06-30 DIAGNOSIS — J309 Allergic rhinitis, unspecified: Secondary | ICD-10-CM | POA: Diagnosis not present

## 2022-07-02 DIAGNOSIS — Z01419 Encounter for gynecological examination (general) (routine) without abnormal findings: Secondary | ICD-10-CM | POA: Diagnosis not present

## 2022-07-02 DIAGNOSIS — Z124 Encounter for screening for malignant neoplasm of cervix: Secondary | ICD-10-CM | POA: Diagnosis not present

## 2022-07-02 DIAGNOSIS — Z6832 Body mass index (BMI) 32.0-32.9, adult: Secondary | ICD-10-CM | POA: Diagnosis not present

## 2022-07-06 ENCOUNTER — Other Ambulatory Visit: Payer: Self-pay | Admitting: Psychiatry

## 2022-07-06 DIAGNOSIS — G2581 Restless legs syndrome: Secondary | ICD-10-CM

## 2022-07-16 ENCOUNTER — Other Ambulatory Visit: Payer: Self-pay | Admitting: Psychiatry

## 2022-07-16 DIAGNOSIS — G2581 Restless legs syndrome: Secondary | ICD-10-CM

## 2022-07-16 DIAGNOSIS — F902 Attention-deficit hyperactivity disorder, combined type: Secondary | ICD-10-CM

## 2022-07-16 DIAGNOSIS — F5105 Insomnia due to other mental disorder: Secondary | ICD-10-CM

## 2022-07-16 NOTE — Telephone Encounter (Signed)
LV 5/6, due 6/3

## 2022-07-21 ENCOUNTER — Other Ambulatory Visit (HOSPITAL_COMMUNITY): Payer: Self-pay

## 2022-07-21 MED ORDER — LISDEXAMFETAMINE DIMESYLATE 60 MG PO CAPS
60.0000 mg | ORAL_CAPSULE | Freq: Every day | ORAL | 0 refills | Status: DC
  Filled 2022-07-21 – 2022-10-02 (×4): qty 30, 30d supply, fill #0

## 2022-07-22 ENCOUNTER — Other Ambulatory Visit: Payer: Self-pay | Admitting: Psychiatry

## 2022-07-22 DIAGNOSIS — F902 Attention-deficit hyperactivity disorder, combined type: Secondary | ICD-10-CM

## 2022-07-24 ENCOUNTER — Other Ambulatory Visit (HOSPITAL_COMMUNITY): Payer: Self-pay

## 2022-07-29 ENCOUNTER — Other Ambulatory Visit: Payer: Self-pay | Admitting: Psychiatry

## 2022-07-29 DIAGNOSIS — F902 Attention-deficit hyperactivity disorder, combined type: Secondary | ICD-10-CM

## 2022-07-29 MED ORDER — LISDEXAMFETAMINE DIMESYLATE 60 MG PO CAPS
60.0000 mg | ORAL_CAPSULE | Freq: Every day | ORAL | 0 refills | Status: DC
Start: 2022-07-29 — End: 2022-10-28
  Filled 2022-07-29 – 2022-08-31 (×3): qty 30, 30d supply, fill #0

## 2022-07-30 ENCOUNTER — Other Ambulatory Visit (HOSPITAL_COMMUNITY): Payer: Self-pay

## 2022-07-30 ENCOUNTER — Other Ambulatory Visit: Payer: Self-pay

## 2022-07-31 ENCOUNTER — Other Ambulatory Visit (HOSPITAL_COMMUNITY): Payer: Self-pay

## 2022-08-01 ENCOUNTER — Other Ambulatory Visit: Payer: Self-pay | Admitting: Psychiatry

## 2022-08-01 ENCOUNTER — Other Ambulatory Visit (HOSPITAL_COMMUNITY): Payer: Self-pay

## 2022-08-01 ENCOUNTER — Telehealth: Payer: Self-pay | Admitting: Psychiatry

## 2022-08-01 MED ORDER — LISDEXAMFETAMINE DIMESYLATE 50 MG PO CAPS
50.0000 mg | ORAL_CAPSULE | Freq: Every day | ORAL | 0 refills | Status: DC
  Filled 2022-08-01: qty 30, 30d supply, fill #0

## 2022-08-01 NOTE — Telephone Encounter (Signed)
Pt called and said that the [facility] only has vyvanse 50 mg in stock. So please send in that mg in.

## 2022-08-16 ENCOUNTER — Other Ambulatory Visit: Payer: Self-pay | Admitting: Psychiatry

## 2022-08-16 DIAGNOSIS — F4001 Agoraphobia with panic disorder: Secondary | ICD-10-CM

## 2022-08-16 DIAGNOSIS — F411 Generalized anxiety disorder: Secondary | ICD-10-CM

## 2022-08-17 NOTE — Telephone Encounter (Signed)
Is she taking 100 or 150. Note says 100.

## 2022-08-19 ENCOUNTER — Other Ambulatory Visit (HOSPITAL_COMMUNITY): Payer: Self-pay

## 2022-08-20 DIAGNOSIS — U071 COVID-19: Secondary | ICD-10-CM | POA: Diagnosis not present

## 2022-08-20 DIAGNOSIS — R52 Pain, unspecified: Secondary | ICD-10-CM | POA: Diagnosis not present

## 2022-08-20 DIAGNOSIS — J029 Acute pharyngitis, unspecified: Secondary | ICD-10-CM | POA: Diagnosis not present

## 2022-08-20 DIAGNOSIS — R5383 Other fatigue: Secondary | ICD-10-CM | POA: Diagnosis not present

## 2022-09-01 ENCOUNTER — Other Ambulatory Visit: Payer: Self-pay

## 2022-09-01 ENCOUNTER — Other Ambulatory Visit (HOSPITAL_COMMUNITY): Payer: Self-pay

## 2022-09-25 ENCOUNTER — Other Ambulatory Visit (HOSPITAL_COMMUNITY): Payer: Self-pay

## 2022-09-29 ENCOUNTER — Other Ambulatory Visit (HOSPITAL_COMMUNITY): Payer: Self-pay

## 2022-10-02 ENCOUNTER — Other Ambulatory Visit (HOSPITAL_COMMUNITY): Payer: Self-pay

## 2022-10-02 ENCOUNTER — Other Ambulatory Visit: Payer: Self-pay | Admitting: Family Medicine

## 2022-10-02 DIAGNOSIS — Z0000 Encounter for general adult medical examination without abnormal findings: Secondary | ICD-10-CM

## 2022-10-03 ENCOUNTER — Ambulatory Visit: Admission: RE | Admit: 2022-10-03 | Payer: BC Managed Care – PPO | Source: Ambulatory Visit

## 2022-10-03 DIAGNOSIS — Z0000 Encounter for general adult medical examination without abnormal findings: Secondary | ICD-10-CM

## 2022-10-03 DIAGNOSIS — Z1231 Encounter for screening mammogram for malignant neoplasm of breast: Secondary | ICD-10-CM | POA: Diagnosis not present

## 2022-10-12 ENCOUNTER — Other Ambulatory Visit: Payer: Self-pay | Admitting: Psychiatry

## 2022-10-12 DIAGNOSIS — F5105 Insomnia due to other mental disorder: Secondary | ICD-10-CM

## 2022-10-12 DIAGNOSIS — G2581 Restless legs syndrome: Secondary | ICD-10-CM

## 2022-10-26 ENCOUNTER — Other Ambulatory Visit: Payer: Self-pay | Admitting: Psychiatry

## 2022-10-26 DIAGNOSIS — F902 Attention-deficit hyperactivity disorder, combined type: Secondary | ICD-10-CM

## 2022-10-26 NOTE — Telephone Encounter (Signed)
Due 9/13.

## 2022-10-28 ENCOUNTER — Encounter: Payer: Self-pay | Admitting: Psychiatry

## 2022-10-28 ENCOUNTER — Other Ambulatory Visit (HOSPITAL_COMMUNITY): Payer: Self-pay

## 2022-10-28 ENCOUNTER — Ambulatory Visit (INDEPENDENT_AMBULATORY_CARE_PROVIDER_SITE_OTHER): Payer: BC Managed Care – PPO | Admitting: Psychiatry

## 2022-10-28 DIAGNOSIS — F411 Generalized anxiety disorder: Secondary | ICD-10-CM

## 2022-10-28 DIAGNOSIS — F4001 Agoraphobia with panic disorder: Secondary | ICD-10-CM

## 2022-10-28 DIAGNOSIS — G4733 Obstructive sleep apnea (adult) (pediatric): Secondary | ICD-10-CM

## 2022-10-28 DIAGNOSIS — G2581 Restless legs syndrome: Secondary | ICD-10-CM | POA: Diagnosis not present

## 2022-10-28 DIAGNOSIS — F5105 Insomnia due to other mental disorder: Secondary | ICD-10-CM

## 2022-10-28 DIAGNOSIS — F902 Attention-deficit hyperactivity disorder, combined type: Secondary | ICD-10-CM

## 2022-10-28 MED ORDER — LISDEXAMFETAMINE DIMESYLATE 60 MG PO CAPS
60.0000 mg | ORAL_CAPSULE | Freq: Every day | ORAL | 0 refills | Status: DC
Start: 2022-11-25 — End: 2023-01-27
  Filled 2022-11-27: qty 30, 30d supply, fill #0

## 2022-10-28 MED ORDER — LISDEXAMFETAMINE DIMESYLATE 60 MG PO CAPS
60.0000 mg | ORAL_CAPSULE | Freq: Every day | ORAL | 0 refills | Status: DC
Start: 2022-10-28 — End: 2023-01-25
  Filled 2022-10-28 – 2022-10-30 (×3): qty 30, 30d supply, fill #0

## 2022-10-28 MED ORDER — ROPINIROLE HCL ER 2 MG PO TB24
2.0000 mg | ORAL_TABLET | Freq: Every evening | ORAL | 0 refills | Status: DC
Start: 2022-10-28 — End: 2022-11-11

## 2022-10-28 NOTE — Progress Notes (Signed)
Pamela Little [id number] [birth year] 52 y.o.    Subjective:   Patient ID:  Pamela Little is a 52 y.o. (DOB 01/12/1971) female.  Chief Complaint:  Chief Complaint  Patient presents with   Follow-up   Depression   Anxiety   ADD   Sleeping Problem    Anxiety Symptoms include decreased concentration and palpitations. Patient reports no chest pain, confusion or nervous/anxious behavior.    Medication Refill Pertinent negatives include no chest pain, congestion or weakness.   Pamela Little presents to the office today for follow-up ofweight concerns and pharmacy problems and FU anxiety.  When seen May 2020.  She was having insomnia as we increased the Xanax to 0.5 mg twice daily and 1 mg nightly.  She continued Wellbutrin XL 300 mg every morning, sertraline 150, ropinirole for restless legs, propranolol as needed for anxiety, naltrexone for weight gain. Also taking pramipexole ER.     seen September.  Following change: DT insomnia increase Xanax 0.5 mg BID and 1mg  HS.  seen February 16, 2019.  The following was noted: Hanging in there.  Quit taking Naltrexone and pramipexole ER 3mg  .  After tennis would feel unusually fatigued and like she might pass out. More RLS in the afternoon without the pramipexole  The following medical decisions were made: Re: RLS is still on unusual med combination for the following reason: Pramipexole only interfered with sleep.  So stopped. Ropinirole only was too fatiguing. So taking the mixture of the 2 for severe RLS Failed iron trials for RLS Trial gabapentin 300 mg 2 twice daily for RLS.  04/2019 appt noted: She does not feel the gabapentin does much so still taking the ropinirole. No SE.   Milder RLS.   Since the higher dosage of Wellbutrin did not help with weight will leave at 300mg  daily.  May retry higherdose once son is placed.  Consider phentermine. Has been told she's in menopause and is having sweating and hot flashes.  Sleep comes and  goes and is not consistent.  Overall mentally OK for the most part.  Definitely anxious.  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  Not depressed.   Can be chronically tired.  Always upset with her weight.  Working normal 40 + hours.  Chronically stressed. Has been a little anxious and been stressed with H sick with leukemia.  Son 16 yo still driving them nuts and not doing school.  Still struggling big time with him.  That will make her weepy.     Definitely anxious.  Struggles with weight.  Plans to see GYN  Trouble staying asleep and 5 hours average, not enough.  Some awakening with anxiety and worrying over son being out and whether he's safe.  Typical Xanax 0.5mg  TID Took Wellb 450 am for awhile and did not lose weight or see any benefit or SE.  Emotional eater and stressed by son's mental health and substance abuse problems and refusing school.  Very angry and defiant.  Anticipates placement somewhere.  Drinking and pot.  Chronically anxious.  Hard on the whole family.  Refusing church.  Pt doesn't think changing her med will help.  I keep eating.  Not ususal for her.  Not dep but upset with her son.  No severe panic attacks.  Best very frustrated with her binge eating at this time but feels the stress at home is precipitating it.  Switched Ativan to Xanax and been OK with that.  Not sure  re: differences except sleeps better ususally.    Tremor manageable. Plan:   Change to 1 mg Xanax DT QL.   Re: RLS is still on unusual med combination for the following reason: Pramipexole only interfered with sleep. So stopped. Ropinirole only was too fatiguing.  Failed iron trials for RLS Trial higher gabapentin 600 mg 2 twice daily for RLS.  Might also help anxiety. Caffeine 1 diet Coke or less.    11/24/19 appt noted: A lot of stress.  2 panic attacks.  Never been so busy and stressed as now.  Menopause.  Mind racing at night and not resting well. Had stopped naltrexone and ER pramipexole bc felt  it was sedating. RLS is still a problem and reduced ropinirole and pramipexole to deal with sedative SE. Wishes she slept better.  CO EMA usually not due to RLS.  Feels RLS more in hands. Sleep 4-5 hours average. Tries to do too much and trouble finishing things.  Son has ADD and wonders if that's part of her problem. Plan: no med changes  06/05/2020 appointment with the following noted: Doing OK. Covid December 2021.  Can still struggle with RLS in legs and hands. Not taking propranolol much. Asks to increase pramipexole for RLS to  0.375 mg in addition to Ropinirole 2 mg PM. Limited to 1 Coke Zero as late as late afternoon. Wonders about ADD. Not binge eating. History palpitations with cardiology appt next week. Some tremor and wonders about reducing Wellbutrin bc no problems with depression lately. Considering ADD tx.  Defer. Anxiety manageable. Plan: Re: RLS is still on unusual med combination for the following reason: Pramipexole only interfered with sleep. Ropinirole only was too fatiguing. OK to use combo if necessary as previously required. Ropinirole 2 mg PM with pramipexole 0.375 mg PM Stop afternoon caffeine. Disc insomnia trazodone vs Sonata.   Reduce Wellbutrin to see if tremor is better.  12/12/20 appt noted: Caffeine contributes to tremor.  Not sure problem with less Wellbutrin.  Always mild depression. Still active. Above dosing and still had terrrible RLS and trouble sleeping.  With Ropinirole alone it helped some.  Spreading out ropinirole helped.  Pramipexole didn't help. No SE except some fatigue.  Split ropinirole helped that and trazodone helped. Rare anxiety in middle of sports activity.  Cleared by cardiology.  Anxiety managed. Still wonders about ADD bc trouble finishing things.  So many things she doesn't finish.  Frustrated.  H notices.  Wants to try ADD tx. Feels needs AM Xanax bc gets demands of day stressed. Plan: Wean AM Xanax and start concerta for  ADD 36 mg AM  02/19/2021 appt noted: Not much benefit noted with Concerta after the first couple of days.   Took it off and on over the holidays. Only Xanax at night. MorE  RLS lately and taking 2 ropinirole 1 mg  usually but increased to 3 in PM. Asks about increasin gConcerta.  No SE Palpitations not worse.   Dx mild - Moderate OSA ordered CPAP but back log. Plan: Increase Concerta 54 mg daily Continue ropinirole 2 to 3 mg in the evening as needed for restless legs Continue sertraline 150 mg daily Continue trazodone 150 to 100 mg nightly as needed Continue Xanax 0.5 mg 3 times daily as needed anxiety or insomnia  05/15/21 appt noted: She thinks it was ok to increase Concerta 54 mg AM.  After a short time doesn't notice much difference.  Initially noticed a change but didn't thereafter.  Skips  weekends.  No sig withdrawal on the weekends.  Constantly moving.   Not real impressed with benefit Concerta.  In the beginning was more productive and efficient.   Sleep pretty good.  Ropinirole and trazodone help with 6-7 hours of sleep.  Less awakening.   No SE.  No sig Xanax during the day.  For the most part anxiety is manageable.  Stress H lost his job and a lot on her plate but is OK with anxiety usually.  No sig panic. Plan: Switch Concerta 54 to Vyvanse 50 mg AM  07/30/2021 appointment the following noted: Busy time at work.  Works for [employer]. Concerta 54 was highest dose and limited response. Can't tell a big difference with Vyvanse and Concerta. A little anxious lately but has some stress. Overwhelmed at times.  Near panic a couple of times. Not depressed. Hates RLS.  Hard to manage it at times. Sleep 4-7 hours.  Using CPAP trying masks starting in Feb and use off and on. Plan: Wellbutrin XL 150 mg AM LED bc stimulants. CONTINUE  Sertraline 100 per her request. Increase Vyvanse to 60 mg AM Continue ropinirole 3 mg PM and trazodone 50-100 mg HS  12/16/2021 appointment noted: Problems  with insurance DT H losing his job and change to her insurance.  Insurance made mistakes.   Disc shortage of vyvanse.  Saw more benefit with increase Vyvanse 60 mg with initial jitteriness.  Helps her productivity.    Not jitery most days. This time of year is busiest with work and activities.   Sleep is OK but worst RLS lately.  Not much caffeine but some Coke Zero.   05/22/22 appt: Change generic ropinirole and got dizzy and wiped out.  But this mainly seemed to be only after playing tennis. Hard time getting Vyvanse.  Disc this at length.  Has been without it for weeks at a time. On Vyvanse 60 am better mood focus and eating behavior Was in poor shape without it.  Couldn't get started and complete things. Residual depression and anxiety. Gritting teeth.  Plan: Needing increase Ropinirole 3 mg - 4 mg PM   10/28/22 appt noted:  Psych meds: Wellbutrin XL 150 every morning, Vyvanse 60 every morning, ropinirole 1 mg tablets 4 in the evening, sertraline 150 daily, trazodone 50 to 100 mg nightly. Usually able to get Vyvanse 60 mg AM. Having trouble with RLS again.  Limits caffeine.  RLS often worse after playing tennis.  Tennis varies.    Trazodone helps sleep.   SE ropinirole if takes more than 2 mg at once.  Brand Vyvanse was clearer.  Mood and anxiety are OK  Stress H with Leukemia and recent hospitalization for heart problems.  Past Psychiatric Medication Trials:  Fluoxetine, Wellbutrin 450 no wt loss,  venlafaxine, sertraline 300, Lexapro 30, fluvoxamine, buspirone,  clonazepam, lorazepam, Xanax 0.5, trazodone 50-100mg  HS Abilify,   Concerta 54 limited effect Vyvanse 60  Vistaril, propranolol, Mirapex, naltrexone nausea  topiramate 200 fatigue, Pramipexole only interfered with sleep NR.   Pramipexole ER Ropinirole only was too fatiguing.   So hx taking the mixture of the 2 for severe RLS Failed iron trials for RLS Gabapentin 600 ? SE Naltrexone excessive weakness.  Review of  Systems:  Review of Systems  HENT:  Negative for congestion.   Cardiovascular:  Positive for palpitations. Negative for chest pain.  Neurological:  Negative for weakness.  Psychiatric/Behavioral:  Positive for decreased concentration and sleep disturbance. Negative for agitation, behavioral problems, confusion,  dysphoric mood, hallucinations and self-injury. The patient is not nervous/anxious and is not hyperactive.     Medications: I have reviewed the patient's current medications.  Current Outpatient Medications  Medication Sig Dispense Refill   albuterol (VENTOLIN HFA) 108 (90 Base) MCG/ACT inhaler      buPROPion (WELLBUTRIN XL) 150 MG 24 hr tablet TAKE 1 TABLET BY MOUTH EVERY DAY 30 tablet 0   estradiol (ESTRACE) 2 MG tablet Take 2 mg by mouth daily.     lisdexamfetamine (VYVANSE) 60 MG capsule Take 1 capsule (60 mg total) by mouth daily. 30 capsule 0   progesterone (PROMETRIUM) 100 MG capsule Take 100 mg by mouth daily.     rOPINIRole (REQUIP XL) 2 MG 24 hr tablet Take 1 tablet (2 mg total) by mouth every evening. 30 tablet 0   sertraline (ZOLOFT) 100 MG tablet TAKE 1.5 TABLETS (150 MG TOTAL) BY MOUTH DAILY (Patient taking differently: Take 100 mg by mouth daily.) 135 tablet 0   traZODone (DESYREL) 50 MG tablet TAKE 1 TO 2 TABLETS BY MOUTH AT BEDTIME 60 tablet 0   lisdexamfetamine (VYVANSE) 60 MG capsule Take 1 capsule (60 mg total) by mouth daily. 30 capsule 0   [START ON 11/25/2022] lisdexamfetamine (VYVANSE) 60 MG capsule Take 1 capsule (60 mg total) by mouth daily. 30 capsule 0   No current facility-administered medications for this visit.    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   Vertigo     Family History  Problem Relation Age of Onset   Hypertension Mother     Hypertension Father    Tremor Sister    Heart attack Neg Hx    Diabetes Neg Hx    Colon cancer Neg Hx    Colon polyps Neg Hx    Stomach cancer Neg Hx    Esophageal cancer Neg Hx     Social History   Socioeconomic History   Marital status: Married    Spouse name: Not on file   Number of children: 3   Years of education: college   Highest education level: Not on file  Occupational History   Not on file  Tobacco Use   Smoking status: Never   Smokeless tobacco: Never  Vaping Use   Vaping status: Never Used  Substance and Sexual Activity   Alcohol use: Yes    Alcohol/week: 1.0 standard drink of alcohol    Types: 1 Cans of beer per week    Comment: occasionaly   Drug use: Never   Sexual activity: Not on file  Other Topics Concern   Not on file  Social History Narrative   Patient is right handed.   Patient drinks 1 cup caffeine daily.   Social Determinants of Health   Financial Resource Strain: Not on file  Food Insecurity: Not on file  Transportation Needs: Not on file  Physical Activity: Not on file  Stress: Not on file  Social Connections: Not on file  Intimate Partner Violence: Unknown (05/23/2021)   [facility]   HITS    Physically Hurt: Not on file    Insult or Talk Down To: Not on file    Threaten Physical Harm: Not on file    Scream or Curse: Not  on file    Past Medical History, Surgical history, Social history, and Family history were reviewed and updated as appropriate.   Please see review of systems for further details on the patient's review from today.   Objective:   Physical Exam:  There were no vitals taken for this visit.  Physical Exam Constitutional:      General: She is not in acute distress. Musculoskeletal:        General: No deformity.  Neurological:     Mental Status: She is alert and oriented to person, place, and time.     Cranial Nerves: No dysarthria.     Coordination: Coordination normal.   Psychiatric:        Attention and Perception: Perception normal. She is inattentive. She does not perceive auditory or visual hallucinations.        Mood and Affect: Mood is anxious. Mood is not depressed. Affect is not labile, blunt, angry or inappropriate.        Speech: Speech normal.        Behavior: Behavior normal. Behavior is cooperative.        Thought Content: Thought content normal. Thought content is not paranoid or delusional. Thought content does not include homicidal or suicidal ideation. Thought content does not include suicidal plan.        Cognition and Memory: Cognition and memory normal.        Judgment: Judgment normal.     Comments: Insight pretty good. She is chronically anxious it is never been completely controlled but is better Residual depression not severe     Lab Review:     Component Value Date/Time   NA 137 06/18/2018 1901   K 3.4 (L) 06/18/2018 1901   CL 104 06/18/2018 1901   CO2 24 06/18/2018 1901   GLUCOSE 107 (H) 06/18/2018 1901   BUN 16 06/18/2018 1901   CREATININE 0.96 06/18/2018 1901   CALCIUM 8.8 (L) 06/18/2018 1901   GFRNONAA >60 06/18/2018 1901   GFRAA >60 06/18/2018 1901       Component Value Date/Time   WBC 12.4 (H) 06/18/2018 1901   RBC 4.43 06/18/2018 1901   HGB 13.4 06/18/2018 1901   HCT 41.0 06/18/2018 1901   PLT 228 06/18/2018 1901   MCV 92.6 06/18/2018 1901   MCH 30.2 06/18/2018 1901   MCHC 32.7 06/18/2018 1901   RDW 13.4 06/18/2018 1901    No results found for: "POCLITH", "LITHIUM"   No results found for: "PHENYTOIN", "PHENOBARB", "VALPROATE", "CBMZ"   .res Assessment: Plan:    Generalized anxiety disorder  Restless legs syndrome - Plan: rOPINIRole (REQUIP XL) 2 MG 24 hr tablet  Panic disorder with agoraphobia  Attention deficit hyperactivity disorder (ADHD), combined type - Plan: lisdexamfetamine (VYVANSE) 60 MG capsule, lisdexamfetamine (VYVANSE) 60 MG capsule  Insomnia due to mental condition  Moderate  obstructive sleep apnea-hypopnea syndrome  30 min face to face time with patient was spent on counseling and coordination of care. We discussed the following:  Disc various dx and status of current med regimen.  depression and anxiety are managed.  RLS is not great.   Binge eating managed.  Wellbutrin XL 150 mg AM LED bc stimulants.  Consisder alternative [place] CONTINUE  Sertraline 100 per her request.  1 mg Xanax DT QL.  We discussed the short-term risks associated with benzodiazepines including sedation and increased fall risk among others.  Discussed long-term side effect risk including dependence, potential withdrawal symptoms, and the potential eventual dose-related risk  of dementia. Disc new studies refuting the link.  RLS not well controlled and having to split ropinirole to tolerate Disc timing of meds.  Disc options but have failed multiple ones. Disc dosing and risk of going to high in dose and causing RLS.    Needing increase Ropinirole 3 mg - 4 mg PM or ER or trial Lyrica as alternative Switch ropinirole to ER 2 mg at evening meal with 1 mg IR.  Stop afternoon caffeine. Reduced Coffee to 1 daily and Coke Zero 1-2 daily.  Needs to stop it DT RLS    Disc insomnia trazodone helpful with Xanax 0.5  Disc mental health ris of untreated OSA.  Extensive discussion of adjustments and masks.  Disc her questions about ADD.  High inattentive score on questionaire. Push fluids.   continue Vyvanse 60 mg AM Discussed potential benefits, risks, and side effects of stimulants with patient to include increased heart rate, palpitations, insomnia, increased anxiety, increased irritability, or decreased appetite.  Instructed patient to contact office if experiencing any significant tolerability issues. Disc difference between brand, generics.  Disc this in depth.  Call if worsening sx  FU 3 mos.  Meredith Staggers, MD, DFAPA   Please see After Visit Summary for patient specific  instructions.  No future appointments.   No orders of the defined types were placed in this encounter.      -------------------------------

## 2022-10-29 ENCOUNTER — Other Ambulatory Visit (HOSPITAL_COMMUNITY): Payer: Self-pay

## 2022-10-30 ENCOUNTER — Other Ambulatory Visit (HOSPITAL_COMMUNITY): Payer: Self-pay

## 2022-11-10 ENCOUNTER — Other Ambulatory Visit: Payer: Self-pay | Admitting: Psychiatry

## 2022-11-10 DIAGNOSIS — F5105 Insomnia due to other mental disorder: Secondary | ICD-10-CM

## 2022-11-10 DIAGNOSIS — G2581 Restless legs syndrome: Secondary | ICD-10-CM

## 2022-11-11 ENCOUNTER — Other Ambulatory Visit: Payer: Self-pay

## 2022-11-11 DIAGNOSIS — G2581 Restless legs syndrome: Secondary | ICD-10-CM

## 2022-11-11 MED ORDER — ROPINIROLE HCL ER 2 MG PO TB24
2.0000 mg | ORAL_TABLET | Freq: Every evening | ORAL | 1 refills | Status: DC
Start: 2022-11-11 — End: 2023-05-20

## 2022-11-14 ENCOUNTER — Other Ambulatory Visit: Payer: Self-pay | Admitting: Psychiatry

## 2022-11-14 DIAGNOSIS — G2581 Restless legs syndrome: Secondary | ICD-10-CM

## 2022-11-22 ENCOUNTER — Other Ambulatory Visit: Payer: Self-pay | Admitting: Psychiatry

## 2022-11-22 DIAGNOSIS — F411 Generalized anxiety disorder: Secondary | ICD-10-CM

## 2022-11-22 DIAGNOSIS — F4001 Agoraphobia with panic disorder: Secondary | ICD-10-CM

## 2022-11-23 NOTE — Telephone Encounter (Signed)
Last note said 100 mg, confirm dose

## 2022-11-28 ENCOUNTER — Other Ambulatory Visit (HOSPITAL_COMMUNITY): Payer: Self-pay

## 2022-12-30 ENCOUNTER — Other Ambulatory Visit: Payer: Self-pay | Admitting: Psychiatry

## 2022-12-30 ENCOUNTER — Other Ambulatory Visit (HOSPITAL_COMMUNITY): Payer: Self-pay

## 2022-12-30 DIAGNOSIS — F902 Attention-deficit hyperactivity disorder, combined type: Secondary | ICD-10-CM

## 2022-12-30 MED ORDER — LISDEXAMFETAMINE DIMESYLATE 60 MG PO CAPS
60.0000 mg | ORAL_CAPSULE | Freq: Every day | ORAL | 0 refills | Status: DC
Start: 2022-12-30 — End: 2023-01-27
  Filled 2022-12-30: qty 30, 30d supply, fill #0

## 2022-12-30 NOTE — Telephone Encounter (Signed)
LF 10/11; LV 09/10

## 2023-01-25 ENCOUNTER — Other Ambulatory Visit: Payer: Self-pay | Admitting: Psychiatry

## 2023-01-25 DIAGNOSIS — F902 Attention-deficit hyperactivity disorder, combined type: Secondary | ICD-10-CM

## 2023-01-26 NOTE — Telephone Encounter (Signed)
Due 12/10

## 2023-01-28 MED ORDER — LISDEXAMFETAMINE DIMESYLATE 60 MG PO CAPS: 60.0000 mg | ORAL_CAPSULE | Freq: Every day | ORAL | 0 refills | Status: DC

## 2023-01-28 MED ORDER — LISDEXAMFETAMINE DIMESYLATE 60 MG PO CAPS
60.0000 mg | ORAL_CAPSULE | Freq: Every day | ORAL | 0 refills | Status: DC
  Filled 2023-01-28: qty 30, 30d supply, fill #0

## 2023-01-28 NOTE — Telephone Encounter (Signed)
Thank you for pending 3.

## 2023-01-29 ENCOUNTER — Other Ambulatory Visit (HOSPITAL_COMMUNITY): Payer: Self-pay

## 2023-01-29 ENCOUNTER — Other Ambulatory Visit: Payer: Self-pay

## 2023-02-20 ENCOUNTER — Other Ambulatory Visit: Payer: Self-pay

## 2023-03-03 ENCOUNTER — Telehealth: Payer: Self-pay | Admitting: Psychiatry

## 2023-03-03 NOTE — Telephone Encounter (Signed)
 Pt called and said that the ropinrole 2 mg xr she has been taking 2 pills a day. She would like a new script [facility] to [facility] [address] for 2 mg 2x daily

## 2023-03-03 NOTE — Telephone Encounter (Signed)
 From 9/10 visit:  Switch ropinirole to ER 2 mg at evening meal with 1 mg IR.

## 2023-03-04 NOTE — Telephone Encounter (Signed)
 LVM to [facility]

## 2023-03-05 ENCOUNTER — Other Ambulatory Visit: Payer: Self-pay

## 2023-03-05 MED ORDER — ROPINIROLE HCL ER 4 MG PO TB24: 4.0000 mg | ORAL_TABLET | Freq: Every day | ORAL | 0 refills | Status: DC

## 2023-03-05 NOTE — Telephone Encounter (Signed)
Pt returned call and she said Dr. Jennelle Human told her she could increase dose as needed. She is taking 2 of the 2 mg ER and not taking the 1 mg IR dose. Pended RF for 4 mg to Dr. Jennelle Human for his review.

## 2023-03-05 NOTE — Telephone Encounter (Signed)
Left second VM to [facility]. Also sent MyChart message.

## 2023-03-23 ENCOUNTER — Other Ambulatory Visit: Payer: Self-pay | Admitting: Psychiatry

## 2023-03-23 DIAGNOSIS — F902 Attention-deficit hyperactivity disorder, combined type: Secondary | ICD-10-CM

## 2023-03-23 MED ORDER — LISDEXAMFETAMINE DIMESYLATE 60 MG PO CAPS
60.0000 mg | ORAL_CAPSULE | Freq: Every day | ORAL | 0 refills | Status: DC
  Filled 2023-03-23: qty 30, 30d supply, fill #0

## 2023-03-24 ENCOUNTER — Other Ambulatory Visit (HOSPITAL_COMMUNITY): Payer: Self-pay

## 2023-04-01 ENCOUNTER — Other Ambulatory Visit: Payer: Self-pay | Admitting: Psychiatry

## 2023-04-09 ENCOUNTER — Other Ambulatory Visit: Payer: Self-pay

## 2023-04-09 DIAGNOSIS — H5213 Myopia, bilateral: Secondary | ICD-10-CM | POA: Diagnosis not present

## 2023-04-09 DIAGNOSIS — H52223 Regular astigmatism, bilateral: Secondary | ICD-10-CM | POA: Diagnosis not present

## 2023-04-09 DIAGNOSIS — G2581 Restless legs syndrome: Secondary | ICD-10-CM

## 2023-04-09 DIAGNOSIS — H524 Presbyopia: Secondary | ICD-10-CM | POA: Diagnosis not present

## 2023-04-09 MED ORDER — BUPROPION HCL ER (XL) 150 MG PO TB24: 150.0000 mg | ORAL_TABLET | Freq: Every day | ORAL | 0 refills | Status: DC

## 2023-04-13 DIAGNOSIS — R051 Acute cough: Secondary | ICD-10-CM | POA: Diagnosis not present

## 2023-04-13 DIAGNOSIS — R509 Fever, unspecified: Secondary | ICD-10-CM | POA: Diagnosis not present

## 2023-04-13 DIAGNOSIS — R0981 Nasal congestion: Secondary | ICD-10-CM | POA: Diagnosis not present

## 2023-04-13 DIAGNOSIS — J111 Influenza due to unidentified influenza virus with other respiratory manifestations: Secondary | ICD-10-CM | POA: Diagnosis not present

## 2023-04-13 DIAGNOSIS — R52 Pain, unspecified: Secondary | ICD-10-CM | POA: Diagnosis not present

## 2023-04-15 DIAGNOSIS — R051 Acute cough: Secondary | ICD-10-CM | POA: Diagnosis not present

## 2023-04-15 DIAGNOSIS — J40 Bronchitis, not specified as acute or chronic: Secondary | ICD-10-CM | POA: Diagnosis not present

## 2023-04-15 DIAGNOSIS — J029 Acute pharyngitis, unspecified: Secondary | ICD-10-CM | POA: Diagnosis not present

## 2023-04-25 ENCOUNTER — Other Ambulatory Visit: Payer: Self-pay | Admitting: Psychiatry

## 2023-04-26 ENCOUNTER — Other Ambulatory Visit: Payer: Self-pay | Admitting: Psychiatry

## 2023-04-26 DIAGNOSIS — F902 Attention-deficit hyperactivity disorder, combined type: Secondary | ICD-10-CM

## 2023-04-27 ENCOUNTER — Other Ambulatory Visit: Payer: Self-pay | Admitting: Psychiatry

## 2023-04-27 ENCOUNTER — Ambulatory Visit: Payer: BC Managed Care – PPO | Admitting: Psychiatry

## 2023-04-27 MED ORDER — LISDEXAMFETAMINE DIMESYLATE 60 MG PO CAPS
60.0000 mg | ORAL_CAPSULE | Freq: Every day | ORAL | 0 refills | Status: DC
  Filled 2023-04-27: qty 30, 30d supply, fill #0

## 2023-04-27 NOTE — Telephone Encounter (Signed)
 Has appt today

## 2023-04-28 ENCOUNTER — Other Ambulatory Visit (HOSPITAL_COMMUNITY): Payer: Self-pay

## 2023-04-28 DIAGNOSIS — D2262 Melanocytic nevi of left upper limb, including shoulder: Secondary | ICD-10-CM | POA: Diagnosis not present

## 2023-04-28 DIAGNOSIS — L718 Other rosacea: Secondary | ICD-10-CM | POA: Diagnosis not present

## 2023-04-28 DIAGNOSIS — L821 Other seborrheic keratosis: Secondary | ICD-10-CM | POA: Diagnosis not present

## 2023-04-28 DIAGNOSIS — D2261 Melanocytic nevi of right upper limb, including shoulder: Secondary | ICD-10-CM | POA: Diagnosis not present

## 2023-05-01 NOTE — Telephone Encounter (Signed)
 Submitted PA with [employer] and response was covered medication no PA required.

## 2023-05-01 NOTE — Telephone Encounter (Signed)
 Pt has not been seen since 10/2022 was due back at 3 months. No showed last scheduled visit Needs an apt

## 2023-05-03 ENCOUNTER — Other Ambulatory Visit: Payer: Self-pay | Admitting: Psychiatry

## 2023-05-03 DIAGNOSIS — F4001 Agoraphobia with panic disorder: Secondary | ICD-10-CM

## 2023-05-03 DIAGNOSIS — F411 Generalized anxiety disorder: Secondary | ICD-10-CM

## 2023-05-04 ENCOUNTER — Other Ambulatory Visit: Payer: Self-pay | Admitting: Psychiatry

## 2023-05-04 DIAGNOSIS — G2581 Restless legs syndrome: Secondary | ICD-10-CM

## 2023-05-04 NOTE — Telephone Encounter (Signed)
 Past due for FU. Sent MyChart message.

## 2023-05-15 ENCOUNTER — Telehealth: Payer: Self-pay

## 2023-05-18 ENCOUNTER — Telehealth: Payer: Self-pay

## 2023-05-19 ENCOUNTER — Other Ambulatory Visit: Payer: Self-pay | Admitting: Psychiatry

## 2023-05-19 DIAGNOSIS — F902 Attention-deficit hyperactivity disorder, combined type: Secondary | ICD-10-CM

## 2023-05-20 ENCOUNTER — Ambulatory Visit: Admitting: Psychiatry

## 2023-05-20 ENCOUNTER — Encounter: Payer: Self-pay | Admitting: Psychiatry

## 2023-05-20 ENCOUNTER — Other Ambulatory Visit (HOSPITAL_COMMUNITY): Payer: Self-pay

## 2023-05-20 DIAGNOSIS — F411 Generalized anxiety disorder: Secondary | ICD-10-CM

## 2023-05-20 DIAGNOSIS — F902 Attention-deficit hyperactivity disorder, combined type: Secondary | ICD-10-CM

## 2023-05-20 DIAGNOSIS — F5105 Insomnia due to other mental disorder: Secondary | ICD-10-CM

## 2023-05-20 DIAGNOSIS — F4001 Agoraphobia with panic disorder: Secondary | ICD-10-CM | POA: Diagnosis not present

## 2023-05-20 DIAGNOSIS — G250 Essential tremor: Secondary | ICD-10-CM

## 2023-05-20 DIAGNOSIS — G2581 Restless legs syndrome: Secondary | ICD-10-CM

## 2023-05-20 DIAGNOSIS — G4733 Obstructive sleep apnea (adult) (pediatric): Secondary | ICD-10-CM

## 2023-05-20 DIAGNOSIS — F50811 Binge eating disorder, moderate: Secondary | ICD-10-CM

## 2023-05-20 MED ORDER — LISDEXAMFETAMINE DIMESYLATE 60 MG PO CAPS: 60.0000 mg | ORAL_CAPSULE | Freq: Every day | ORAL | 0 refills | Status: DC

## 2023-05-20 MED ORDER — ROPINIROLE HCL 1 MG PO TABS: 1.0000 mg | ORAL_TABLET | Freq: Every evening | ORAL | 0 refills | Status: DC | PRN

## 2023-05-20 MED ORDER — ROPINIROLE HCL ER 4 MG PO TB24: 4.0000 mg | ORAL_TABLET | Freq: Every day | ORAL | 2 refills | Status: DC

## 2023-05-20 MED ORDER — SERTRALINE HCL 100 MG PO TABS
150.0000 mg | ORAL_TABLET | Freq: Every day | ORAL | 3 refills | Status: DC
Start: 2023-05-20 — End: 2023-12-09

## 2023-05-20 MED ORDER — TRAZODONE HCL 50 MG PO TABS: 50.0000 mg | ORAL_TABLET | Freq: Every day | ORAL | 3 refills | Status: DC

## 2023-05-20 MED ORDER — VYVANSE 60 MG PO CAPS
60.0000 mg | ORAL_CAPSULE | ORAL | 0 refills | Status: DC
  Filled 2023-05-20 – 2023-05-28 (×2): qty 30, 30d supply, fill #0

## 2023-05-20 MED ORDER — BUPROPION HCL ER (XL) 150 MG PO TB24
150.0000 mg | ORAL_TABLET | Freq: Every day | ORAL | 3 refills | Status: DC
Start: 2023-05-20 — End: 2023-12-09

## 2023-05-20 NOTE — Progress Notes (Signed)
 Pamela Little [id number] 02/17/1971 53 y.o.    Subjective:   Patient ID:  Pamela Little is a 53 y.o. (DOB 12-Jan-1971) female.  Chief Complaint:  Chief Complaint  Patient presents with   Follow-up   Depression   Anxiety   Sleeping Problem    Pamela Little presents to the office today for follow-up ofweight concerns and pharmacy problems and FU anxiety.  When seen May 2020.  She was having insomnia as we increased the Xanax to 0.5 mg twice daily and 1 mg nightly.  She continued Wellbutrin XL 300 mg every morning, sertraline 150, ropinirole for restless legs, propranolol as needed for anxiety, naltrexone for weight gain. Also taking pramipexole ER.     seen September.  Following change: DT insomnia increase Xanax 0.5 mg BID and 1mg  HS.  seen February 16, 2019.  The following was noted: Hanging in there.  Quit taking Naltrexone and pramipexole ER 3mg  .  After tennis would feel unusually fatigued and like she might pass out. More RLS in the afternoon without the pramipexole  The following medical decisions were made: Re: RLS is still on unusual med combination for the following reason: Pramipexole only interfered with sleep.  So stopped. Ropinirole only was too fatiguing. So taking the mixture of the 2 for severe RLS Failed iron trials for RLS Trial gabapentin 300 mg 2 twice daily for RLS.  04/2019 appt noted: She does not feel the gabapentin does much so still taking the ropinirole. No SE.   Milder RLS.   Since the higher dosage of Wellbutrin did not help with weight will leave at 300mg  daily.  May retry higherdose once son is placed.  Consider phentermine. Has been told she's in menopause and is having sweating and hot flashes.  Sleep comes and goes and is not consistent.  Overall mentally OK for the most part.  Definitely anxious.  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  Not depressed.   Can be chronically tired.  Always upset with her weight.  Working normal 40 +  hours.  Chronically stressed. Has been a little anxious and been stressed with H sick with leukemia.  Son 76 yo still driving them nuts and not doing school.  Still struggling big time with him.  That will make her weepy.     Definitely anxious.  Struggles with weight.  Plans to see GYN  Trouble staying asleep and 5 hours average, not enough.  Some awakening with anxiety and worrying over son being out and whether he's safe.  Typical Xanax 0.5mg  TID Took Wellb 450 am for awhile and did not lose weight or see any benefit or SE.  Emotional eater and stressed by son's mental health and substance abuse problems and refusing school.  Very angry and defiant.  Anticipates placement somewhere.  Drinking and pot.  Chronically anxious.  Hard on the whole family.  Refusing church.  Pt doesn't think changing her med will help.  I keep eating.  Not ususal for her.  Not dep but upset with her son.  No severe panic attacks.  Best very frustrated with her binge eating at this time but feels the stress at home is precipitating it.  Switched Ativan to Xanax and been OK with that.  Not sure re: differences except sleeps better ususally.    Tremor manageable. Plan:   Change to 1 mg Xanax DT QL.   Re: RLS is still on unusual med combination for the following reason:  Pramipexole only interfered with sleep. So stopped. Ropinirole only was too fatiguing.  Failed iron trials for RLS Trial higher gabapentin 600 mg 2 twice daily for RLS.  Might also help anxiety. Caffeine 1 diet Coke or less.    11/24/19 appt noted: A lot of stress.  2 panic attacks.  Never been so busy and stressed as now.  Menopause.  Mind racing at night and not resting well. Had stopped naltrexone and ER pramipexole bc felt it was sedating. RLS is still a problem and reduced ropinirole and pramipexole to deal with sedative SE. Wishes she slept better.  CO EMA usually not due to RLS.  Feels RLS more in hands. Sleep 4-5 hours average. Tries to do too much  and trouble finishing things.  Son has ADD and wonders if that's part of her problem. Plan: no med changes  06/05/2020 appointment with the following noted: Doing OK. Covid December 2021.  Can still struggle with RLS in legs and hands. Not taking propranolol much. Asks to increase pramipexole for RLS to  0.375 mg in addition to Ropinirole 2 mg PM. Limited to 1 Coke Zero as late as late afternoon. Wonders about ADD. Not binge eating. History palpitations with cardiology appt next week. Some tremor and wonders about reducing Wellbutrin bc no problems with depression lately. Considering ADD tx.  Defer. Anxiety manageable. Plan: Re: RLS is still on unusual med combination for the following reason: Pramipexole only interfered with sleep. Ropinirole only was too fatiguing. OK to use combo if necessary as previously required. Ropinirole 2 mg PM with pramipexole 0.375 mg PM Stop afternoon caffeine. Disc insomnia trazodone vs Sonata.   Reduce Wellbutrin to see if tremor is better.  12/12/20 appt noted: Caffeine contributes to tremor.  Not sure problem with less Wellbutrin.  Always mild depression. Still active. Above dosing and still had terrrible RLS and trouble sleeping.  With Ropinirole alone it helped some.  Spreading out ropinirole helped.  Pramipexole didn't help. No SE except some fatigue.  Split ropinirole helped that and trazodone helped. Rare anxiety in middle of sports activity.  Cleared by cardiology.  Anxiety managed. Still wonders about ADD bc trouble finishing things.  So many things she doesn't finish.  Frustrated.  H notices.  Wants to try ADD tx. Feels needs AM Xanax bc gets demands of day stressed. Plan: Wean AM Xanax and start concerta for ADD 36 mg AM  02/19/2021 appt noted: Not much benefit noted with Concerta after the first couple of days.   Took it off and on over the holidays. Only Xanax at night. MorE  RLS lately and taking 2 ropinirole 1 mg  usually but increased  to 3 in PM. Asks about increasin gConcerta.  No SE Palpitations not worse.   Dx mild - Moderate OSA ordered CPAP but back log. Plan: Increase Concerta 54 mg daily Continue ropinirole 2 to 3 mg in the evening as needed for restless legs Continue sertraline 150 mg daily Continue trazodone 150 to 100 mg nightly as needed Continue Xanax 0.5 mg 3 times daily as needed anxiety or insomnia  05/15/21 appt noted: She thinks it was ok to increase Concerta 54 mg AM.  After a short time doesn't notice much difference.  Initially noticed a change but didn't thereafter.  Skips weekends.  No sig withdrawal on the weekends.  Constantly moving.   Not real impressed with benefit Concerta.  In the beginning was more productive and efficient.   Sleep pretty good.  Ropinirole  and trazodone help with 6-7 hours of sleep.  Less awakening.   No SE.  No sig Xanax during the day.  For the most part anxiety is manageable.  Stress H lost his job and a lot on her plate but is OK with anxiety usually.  No sig panic. Plan: Switch Concerta 54 to Vyvanse 50 mg AM  07/30/2021 appointment the following noted: Busy time at work.  Works for [employer]. Concerta 54 was highest dose and limited response. Can't tell a big difference with Vyvanse and Concerta. A little anxious lately but has some stress. Overwhelmed at times.  Near panic a couple of times. Not depressed. Hates RLS.  Hard to manage it at times. Sleep 4-7 hours.  Using CPAP trying masks starting in Feb and use off and on. Plan: Wellbutrin XL 150 mg AM LED bc stimulants. CONTINUE  Sertraline 100 per her request. Increase Vyvanse to 60 mg AM Continue ropinirole 3 mg PM and trazodone 50-100 mg HS  12/16/2021 appointment noted: Problems with insurance DT H losing his job and change to her insurance.  Insurance made mistakes.   Disc shortage of vyvanse.  Saw more benefit with increase Vyvanse 60 mg with initial jitteriness.  Helps her productivity.    Not jitery most  days. This time of year is busiest with work and activities.   Sleep is OK but worst RLS lately.  Not much caffeine but some Coke Zero.   05/22/22 appt: Change generic ropinirole and got dizzy and wiped out.  But this mainly seemed to be only after playing tennis. Hard time getting Vyvanse.  Disc this at length.  Has been without it for weeks at a time. On Vyvanse 60 am better mood focus and eating behavior Was in poor shape without it.  Couldn't get started and complete things. Residual depression and anxiety. Gritting teeth.  Plan: Needing increase Ropinirole 3 mg - 4 mg PM   10/28/22 appt noted:  Psych meds: Wellbutrin XL 150 every morning, Vyvanse 60 every morning, ropinirole 1 mg tablets 4 in the evening, sertraline 150 daily, trazodone 50 to 100 mg nightly. Usually able to get Vyvanse 60 mg AM. Having trouble with RLS again.  Limits caffeine.  RLS often worse after playing tennis.  Tennis varies.    Trazodone helps sleep.   SE ropinirole if takes more than 2 mg at once.  Brand Vyvanse was clearer.  Mood and anxiety are OK Plan: Needing increase Ropinirole 3 mg - 4 mg PM or ER or trial Lyrica as alternative Switch ropinirole to ER 2 mg at evening meal with 1 mg IR.  05/20/23 appt noted: Psych meds: Wellbutrin XL 150 every morning, Vyvanse 60 every morning, sertraline 150 daily, trazodone 50 to 100 mg nightly. ropinirole ER 4 mg tablets  Working all the time.  Short staffed.  Need to say no to more things.   RLS can get bad but not too often.  Sometimes waits too long to take ropinirole.  Otherwise satisfactory response.  Sleep variable good the last couple of  nights.   Wants to try brand Vyvanse again for better response if affordable. Mood fine.  Anxious with all the work but pretty normal for high volume work.    Stress H with Leukemia and recent hospitalization for heart problems.  Past Psychiatric Medication Trials:  Fluoxetine, Wellbutrin 450 no wt loss,  venlafaxine,  sertraline 300, Lexapro 30, fluvoxamine, buspirone,  clonazepam, lorazepam, Xanax 0.5, trazodone 50-100mg  HS Abilify,   Concerta  54 limited effect Vyvanse 60  Vistaril, propranolol, Mirapex, naltrexone nausea  topiramate 200 fatigue, Pramipexole only interfered with sleep NR.   Pramipexole ER Ropinirole only was too fatiguing.   So hx taking the mixture of the 2 for severe RLS Failed iron trials for RLS Gabapentin 600 ? SE Naltrexone excessive weakness.  Review of Systems:  Review of Systems  HENT:  Negative for congestion.   Cardiovascular:  Positive for palpitations. Negative for chest pain.  Neurological:  Negative for weakness.  Psychiatric/Behavioral:  Positive for decreased concentration and sleep disturbance. Negative for agitation, behavioral problems, confusion, dysphoric mood, hallucinations and self-injury. The patient is not nervous/anxious and is not hyperactive.     Medications: I have reviewed the patient's current medications.  Current Outpatient Medications  Medication Sig Dispense Refill   albuterol (VENTOLIN HFA) 108 (90 Base) MCG/ACT inhaler      buPROPion (WELLBUTRIN XL) 150 MG 24 hr tablet Take 1 tablet (150 mg total) by mouth daily. 90 tablet 3   estradiol (ESTRACE) 2 MG tablet Take 2 mg by mouth daily.     lisdexamfetamine (VYVANSE) 60 MG capsule Take 1 capsule (60 mg total) by mouth daily. 30 capsule 0   lisdexamfetamine (VYVANSE) 60 MG capsule Take 1 capsule (60 mg total) by mouth daily. 30 capsule 0   lisdexamfetamine (VYVANSE) 60 MG capsule Take 1 capsule (60 mg total) by mouth daily. 90 capsule 0   progesterone (PROMETRIUM) 100 MG capsule Take 100 mg by mouth daily.     rOPINIRole (REQUIP XL) 4 MG 24 hr tablet Take 1 tablet (4 mg total) by mouth at bedtime. 90 tablet 2   rOPINIRole (REQUIP) 1 MG tablet Take 1 tablet (1 mg total) by mouth at bedtime as needed (restless legs). 30 tablet 0   sertraline (ZOLOFT) 100 MG tablet Take 1.5 tablets (150 mg  total) by mouth daily. 135 tablet 3   traZODone (DESYREL) 50 MG tablet Take 1-2 tablets (50-100 mg total) by mouth at bedtime. 180 tablet 3   VYVANSE 60 MG capsule Take 1 capsule (60 mg total) by mouth every morning. 30 capsule 0   No current facility-administered medications for this visit.    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   Vertigo     Family History  Problem Relation Age of Onset   Hypertension Mother    Hypertension Father    Tremor Sister    Heart attack Neg Hx    Diabetes Neg Hx    Colon cancer Neg Hx    Colon polyps Neg Hx    Stomach cancer Neg Hx    Esophageal cancer Neg Hx     Social History   Socioeconomic History   Marital status: Married    Spouse name: Not on file   Number of children: 3   Years of education: college   Highest education level: Not on file  Occupational History   Not on file  Tobacco Use   Smoking status: Never   Smokeless tobacco: Never  Vaping Use   Vaping status: Never Used  Substance and Sexual Activity   Alcohol use: Yes    Alcohol/week: 1.0 standard drink of alcohol    Types: 1 Cans of beer per week  Comment: occasionaly   Drug use: Never   Sexual activity: Not on file  Other Topics Concern   Not on file  Social History Narrative   Patient is right handed.   Patient drinks 1 cup caffeine daily.   Social Drivers of Corporate investment banker Strain: Not on file  Food Insecurity: Not on file  Transportation Needs: Not on file  Physical Activity: Not on file  Stress: Not on file  Social Connections: Not on file  Intimate Partner Violence: Unknown (05/23/2021)   Received from [facility], [facility]   HITS    Physically Hurt: Not on file    Insult or Talk Down To: Not on file    Threaten Physical  Harm: Not on file    Scream or Curse: Not on file    Past Medical History, Surgical history, Social history, and Family history were reviewed and updated as appropriate.   Please see review of systems for further details on the patient's review from today.   Objective:   Physical Exam:  There were no vitals taken for this visit.  Physical Exam Constitutional:      General: She is not in acute distress. Musculoskeletal:        General: No deformity.  Neurological:     Mental Status: She is alert and oriented to person, place, and time.     Cranial Nerves: No dysarthria.     Coordination: Coordination normal.  Psychiatric:        Attention and Perception: Perception normal. She is inattentive. She does not perceive auditory or visual hallucinations.        Mood and Affect: Mood is anxious. Mood is not depressed. Affect is not labile, blunt or inappropriate.        Speech: Speech normal.        Behavior: Behavior normal. Behavior is cooperative.        Thought Content: Thought content normal. Thought content is not paranoid or delusional. Thought content does not include homicidal or suicidal ideation. Thought content does not include suicidal plan.        Cognition and Memory: Cognition and memory normal.        Judgment: Judgment normal.     Comments: Insight pretty good. She is chronically anxious it is never been completely controlled but is better Residual depression not severe     Lab Review:     Component Value Date/Time   NA 137 06/18/2018 1901   K 3.4 (L) 06/18/2018 1901   CL 104 06/18/2018 1901   CO2 24 06/18/2018 1901   GLUCOSE 107 (H) 06/18/2018 1901   BUN 16 06/18/2018 1901   CREATININE 0.96 06/18/2018 1901   CALCIUM 8.8 (L) 06/18/2018 1901   GFRNONAA >60 06/18/2018 1901   GFRAA >60 06/18/2018 1901       Component Value Date/Time   WBC 12.4 (H) 06/18/2018 1901   RBC 4.43 06/18/2018 1901   HGB 13.4 06/18/2018 1901   HCT 41.0 06/18/2018 1901   PLT 228  06/18/2018 1901   MCV 92.6 06/18/2018 1901   MCH 30.2 06/18/2018 1901   MCHC 32.7 06/18/2018 1901   RDW 13.4 06/18/2018 1901    No results found for: "POCLITH", "LITHIUM"   No results found for: "PHENYTOIN", "PHENOBARB", "VALPROATE", "CBMZ"   .res Assessment: Plan:    Generalized anxiety disorder - Plan: sertraline (ZOLOFT) 100 MG tablet  Restless legs syndrome - Plan: rOPINIRole (REQUIP) 1 MG tablet, buPROPion (WELLBUTRIN XL) 150 MG  24 hr tablet  Panic disorder with agoraphobia - Plan: sertraline (ZOLOFT) 100 MG tablet  Attention deficit hyperactivity disorder (ADHD), combined type - Plan: VYVANSE 60 MG capsule, lisdexamfetamine (VYVANSE) 60 MG capsule  Insomnia due to mental condition - Plan: traZODone (DESYREL) 50 MG tablet  Tremor, essential  Binge-eating disorder, moderate  Moderate obstructive sleep apnea-hypopnea syndrome  30 min face to face time with patient was spent on counseling and coordination of care. We discussed the following:  Disc various dx and status of current med regimen.  depression and anxiety are managed.  RLS is not great.   Binge eating managed.  Wellbutrin XL 150 mg AM LED bc stimulants.  Consisder alternative [place] CONTINUE  Sertraline 100 per her request.  1 mg Xanax DT QL.  We discussed the short-term risks associated with benzodiazepines including sedation and increased fall risk among others.  Discussed long-term side effect risk including dependence, potential withdrawal symptoms, and the potential eventual dose-related risk of dementia. Disc new studies refuting the link.  RLS not well controlled and having to split ropinirole to tolerate Disc timing of meds.  Disc options but have failed multiple ones. Disc dosing and risk of going to high in dose and causing RLS.     ropinirole ER 4 mg at evening meal prn  1 mg IR.  Stop afternoon caffeine. Reduced Coffee to 1 daily and Coke Zero 1-2 daily.  Needs to stop it DT RLS    Disc  insomnia trazodone helpful with Xanax 0.5  Disc mental health ris of untreated OSA.  Extensive discussion of adjustments and masks.  Disc her questions about ADD.  High inattentive score on questionaire. Push fluids.   continue Vyvanse 60 mg AM.  She wants to try brand again if affordable. Discussed potential benefits, risks, and side effects of stimulants with patient to include increased heart rate, palpitations, insomnia, increased anxiety, increased irritability, or decreased appetite.  Instructed patient to contact office if experiencing any significant tolerability issues. Disc difference between brand, generics.  Disc this in depth.  Sent Brand Vyvanse 30 day to see if affordable. If not  she has backup RX generic 90 day RX  Call if worsening sx  FU 6 mos.  Meredith Staggers, MD, DFAPA   Please see After Visit Summary for patient specific instructions.  Future Appointments  Date Time Provider Department Center  11/19/2023  4:30 PM Cottle, Steva Ready., MD [facility] None     No orders of the defined types were placed in this encounter.      -------------------------------

## 2023-05-20 NOTE — Telephone Encounter (Signed)
 Pamela Little

## 2023-05-22 ENCOUNTER — Encounter (HOSPITAL_COMMUNITY): Payer: Self-pay

## 2023-05-22 ENCOUNTER — Other Ambulatory Visit (HOSPITAL_COMMUNITY): Payer: Self-pay

## 2023-05-28 ENCOUNTER — Other Ambulatory Visit (HOSPITAL_COMMUNITY): Payer: Self-pay

## 2023-05-31 NOTE — Telephone Encounter (Signed)
 Marland Kitchen

## 2023-06-10 ENCOUNTER — Encounter (HOSPITAL_BASED_OUTPATIENT_CLINIC_OR_DEPARTMENT_OTHER): Payer: Self-pay | Admitting: Emergency Medicine

## 2023-06-10 ENCOUNTER — Emergency Department (HOSPITAL_BASED_OUTPATIENT_CLINIC_OR_DEPARTMENT_OTHER)

## 2023-06-10 ENCOUNTER — Emergency Department (HOSPITAL_BASED_OUTPATIENT_CLINIC_OR_DEPARTMENT_OTHER)
Admission: EM | Admit: 2023-06-10 | Discharge: 2023-06-11 | Disposition: A | Discharge status: Home / Self Care | Attending: Emergency Medicine | Admitting: Emergency Medicine

## 2023-06-10 DIAGNOSIS — K200 Eosinophilic esophagitis: Secondary | ICD-10-CM | POA: Diagnosis not present

## 2023-06-10 DIAGNOSIS — K222 Esophageal obstruction: Secondary | ICD-10-CM

## 2023-06-10 DIAGNOSIS — T18128A Food in esophagus causing other injury, initial encounter: Secondary | ICD-10-CM

## 2023-06-10 DIAGNOSIS — K449 Diaphragmatic hernia without obstruction or gangrene: Secondary | ICD-10-CM | POA: Insufficient documentation

## 2023-06-10 DIAGNOSIS — W44F3XA Food entering into or through a natural orifice, initial encounter: Secondary | ICD-10-CM

## 2023-06-10 DIAGNOSIS — K56699 Other intestinal obstruction unspecified as to partial versus complete obstruction: Secondary | ICD-10-CM | POA: Diagnosis not present

## 2023-06-10 DIAGNOSIS — R1314 Dysphagia, pharyngoesophageal phase: Secondary | ICD-10-CM | POA: Insufficient documentation

## 2023-06-10 DIAGNOSIS — R079 Chest pain, unspecified: Secondary | ICD-10-CM | POA: Diagnosis not present

## 2023-06-10 DIAGNOSIS — K2100 Gastro-esophageal reflux disease with esophagitis, without bleeding: Secondary | ICD-10-CM

## 2023-06-10 DIAGNOSIS — R1110 Vomiting, unspecified: Secondary | ICD-10-CM | POA: Diagnosis not present

## 2023-06-10 DIAGNOSIS — R1310 Dysphagia, unspecified: Secondary | ICD-10-CM | POA: Diagnosis not present

## 2023-06-10 LAB — CBC WITH DIFFERENTIAL/PLATELET
Abs Immature Granulocytes: 0.01 10*3/uL (ref 0.00–0.07)
Basophils Absolute: 0 10*3/uL (ref 0.0–0.1)
Basophils Relative: 1 %
Eosinophils Absolute: 0.2 10*3/uL (ref 0.0–0.5)
Eosinophils Relative: 3 %
HCT: 42.4 % (ref 36.0–46.0)
Hemoglobin: 14.7 g/dL (ref 12.0–15.0)
Immature Granulocytes: 0 %
Lymphocytes Relative: 19 %
Lymphs Abs: 1.2 10*3/uL (ref 0.7–4.0)
MCH: 31.5 pg (ref 26.0–34.0)
MCHC: 34.7 g/dL (ref 30.0–36.0)
MCV: 91 fL (ref 80.0–100.0)
Monocytes Absolute: 0.6 10*3/uL (ref 0.1–1.0)
Monocytes Relative: 9 %
Neutro Abs: 4.4 10*3/uL (ref 1.7–7.7)
Neutrophils Relative %: 68 %
Platelets: 240 10*3/uL (ref 150–400)
RBC: 4.66 MIL/uL (ref 3.87–5.11)
RDW: 12.9 % (ref 11.5–15.5)
WBC: 6.5 10*3/uL (ref 4.0–10.5)
nRBC: 0 % (ref 0.0–0.2)

## 2023-06-10 LAB — COMPREHENSIVE METABOLIC PANEL WITH GFR
ALT: 25 U/L (ref 0–44)
AST: 34 U/L (ref 15–41)
Albumin: 5 g/dL (ref 3.5–5.0)
Alkaline Phosphatase: 81 U/L (ref 38–126)
Anion gap: 12 (ref 5–15)
BUN: 15 mg/dL (ref 6–20)
CO2: 24 mmol/L (ref 22–32)
Calcium: 10.4 mg/dL — ABNORMAL HIGH (ref 8.9–10.3)
Chloride: 103 mmol/L (ref 98–111)
Creatinine, Ser: 0.99 mg/dL (ref 0.44–1.00)
GFR, Estimated: >60 mL/min (ref >60)
Glucose, Bld: 93 mg/dL (ref 70–99)
Potassium: 4 mmol/L (ref 3.5–5.1)
Sodium: 140 mmol/L (ref 135–145)
Total Bilirubin: 0.8 mg/dL (ref 0.0–1.2)
Total Protein: 7.7 g/dL (ref 6.5–8.1)

## 2023-06-10 LAB — LIPASE, BLOOD: Lipase: 23 U/L (ref 11–51)

## 2023-06-10 MED ORDER — GLUCAGON HCL RDNA (DIAGNOSTIC) 1 MG IJ SOLR
1.0000 mg | Freq: Once | INTRAMUSCULAR | Status: AC
  Administered 2023-06-10: 1 mg via INTRAVENOUS
  Filled 2023-06-10: qty 1

## 2023-06-10 MED ORDER — ONDANSETRON HCL 4 MG/2ML IJ SOLN
4.0000 mg | Freq: Once | INTRAMUSCULAR | Status: AC
  Administered 2023-06-10: 4 mg via INTRAVENOUS
  Filled 2023-06-10: qty 2

## 2023-06-10 MED ORDER — LACTATED RINGERS IV SOLN: INTRAVENOUS | Status: DC

## 2023-06-10 MED ORDER — FAMOTIDINE IN NACL 20-0.9 MG/50ML-% IV SOLN
20.0000 mg | Freq: Once | INTRAVENOUS | Status: AC
  Administered 2023-06-10: 20 mg via INTRAVENOUS
  Filled 2023-06-10: qty 50

## 2023-06-10 MED ORDER — DIAZEPAM 5 MG/ML IJ SOLN
5.0000 mg | Freq: Once | INTRAMUSCULAR | Status: AC
  Administered 2023-06-10: 5 mg via INTRAVENOUS
  Filled 2023-06-10: qty 2

## 2023-06-10 NOTE — ED Provider Notes (Signed)
 [place] EMERGENCY DEPARTMENT AT [facility] Provider Note   CSN: [id number] Arrival date & time: 06/10/23  1752     History  Chief Complaint  Patient presents with   Gastroesophageal Reflux    Pamela Little is a 53 y.o. female.  Patient is a 53 year old female with a past medical history of anxiety and restless leg syndrome presenting to the emergency department with concern for food bolus impaction.  Patient states that she does have frequent choking episodes but normally is able to pass the food on her own.  She states that she did feel like she was choking on her food at dinner last night and is continued to have a sensation of food stuck in her chest since then.  She states that she has been nauseous and vomiting all night.  She states that she has tried to take sips of water but vomits every time she tries.  She states that she is feeling dehydrated.  She states that she has never seen GI before ever had endoscopy before.  The history is provided by the patient and the spouse.  Gastroesophageal Reflux       Home Medications Prior to Admission medications   Medication Sig Start Date End Date Taking? Authorizing Provider  albuterol (VENTOLIN HFA) 108 (90 Base) MCG/ACT inhaler  09/30/18   [provider]  buPROPion  (WELLBUTRIN  XL) 150 MG 24 hr tablet Take 1 tablet (150 mg total) by mouth daily. 05/20/23   Cottle, Kennedy Peabody., MD  estradiol (ESTRACE) 2 MG tablet Take 2 mg by mouth daily. 06/10/21   [provider]  lisdexamfetamine (VYVANSE ) 60 MG capsule Take 1 capsule (60 mg total) by mouth daily. 02/24/23   Cottle, Kennedy Peabody., MD  lisdexamfetamine (VYVANSE ) 60 MG capsule Take 1 capsule (60 mg total) by mouth daily. 04/27/23   Cottle, Kennedy Peabody., MD  lisdexamfetamine (VYVANSE ) 60 MG capsule Take 1 capsule (60 mg total) by mouth daily. 05/20/23   Cottle, Kennedy Peabody., MD  progesterone (PROMETRIUM) 100 MG capsule Take 100 mg by mouth daily. 06/10/21    [provider]  rOPINIRole  (REQUIP  XL) 4 MG 24 hr tablet Take 1 tablet (4 mg total) by mouth at bedtime. 05/20/23   Cottle, Kennedy Peabody., MD  rOPINIRole  (REQUIP ) 1 MG tablet Take 1 tablet (1 mg total) by mouth at bedtime as needed (restless legs). 05/20/23   Cottle, Kennedy Peabody., MD  sertraline  (ZOLOFT ) 100 MG tablet Take 1.5 tablets (150 mg total) by mouth daily. 05/20/23   Cottle, Kennedy Peabody., MD  traZODone  (DESYREL ) 50 MG tablet Take 1-2 tablets (50-100 mg total) by mouth at bedtime. 05/20/23   Cottle, Kennedy Peabody., MD  VYVANSE  60 MG capsule Take 1 capsule (60 mg total) by mouth every morning. 05/20/23   Cottle, Kennedy Peabody., MD      Allergies    Patient has no known allergies.    Review of Systems   Review of Systems  Physical Exam Updated Vital Signs BP (!) 137/98   Pulse 66   Temp 98.3 F (36.8 C) (Oral)   Resp 18   SpO2 100%  Physical Exam Vitals and nursing note reviewed.  Constitutional:      General: She is not in acute distress.    Appearance: Normal appearance.  HENT:     Head: Normocephalic and atraumatic.     Nose: Nose normal.     Mouth/Throat:     Mouth:  Mucous membranes are moist.  Eyes:     Extraocular Movements: Extraocular movements intact.     Conjunctiva/sclera: Conjunctivae normal.  Cardiovascular:     Rate and Rhythm: Normal rate and regular rhythm.     Heart sounds: Normal heart sounds.  Pulmonary:     Effort: Pulmonary effort is normal.     Breath sounds: Normal breath sounds.  Abdominal:     General: Abdomen is flat.     Palpations: Abdomen is soft.     Tenderness: There is no abdominal tenderness.  Musculoskeletal:        General: Normal range of motion.     Cervical back: Normal range of motion.  Skin:    General: Skin is warm and dry.  Neurological:     General: No focal deficit present.     Mental Status: She is alert and oriented to person, place, and time.  Psychiatric:        Mood and Affect: Mood normal.        Behavior: Behavior  normal.     ED Results / Procedures / Treatments   Labs (all labs ordered are listed, but only abnormal results are displayed) Labs Reviewed  COMPREHENSIVE METABOLIC PANEL WITH GFR - Abnormal; Notable for the following components:      Result Value   Calcium 10.4 (*)    All other components within normal limits  CBC WITH DIFFERENTIAL/PLATELET  LIPASE, BLOOD    EKG None  Radiology DG Chest Port 1 View Result Date: 06/10/2023 CLINICAL DATA:  Chest pain EXAM: PORTABLE CHEST 1 VIEW COMPARISON:  Chest x-ray 09/07/2018 FINDINGS: The heart size and mediastinal contours are within normal limits. Both lungs are clear. The visualized skeletal structures are unremarkable. IMPRESSION: No active disease. Electronically Signed   By: Tyron Gallon M.D.   On: 06/10/2023 22:10    Procedures Procedures    Medications Ordered in ED Medications  diazepam  (VALIUM ) injection 5 mg (has no administration in time range)  glucagon  (human recombinant) (GLUCAGEN ) injection 1 mg (1 mg Intravenous Given 06/10/23 2115)  ondansetron  (ZOFRAN ) injection 4 mg (4 mg Intravenous Given 06/10/23 2117)  famotidine  (PEPCID ) IVPB 20 mg premix (0 mg Intravenous Stopped 06/10/23 2323)    ED Course/ Medical Decision Making/ A&P Clinical Course as of 06/10/23 2334  Wed Jun 10, 2023  2234 Patient's labs within normal range. She reports that she has mildly decreased pain but is still spitting up and the pain is still present. No significant improvement with glucagon  and still unable to tolerate PO. Will consult GI for consult for food impaction. [VK]  2253 Dr. Dominic Friendly notified about patient, he is currently in a procedure and is pending recs. [VK]  2325 I spoke with Dr. Dominic Friendly who recommends transfer to [facility] ED with plan for likely endoscopy with Dr. Leonia Raman in the AM. Dr. Alison Irvine is accepting for transfer. Patient requested anxiolysis for restless leg. Was given valium . [VK]    Clinical Course User Index [VK] Kingsley,  Henryk Ursin K, DO                                 Medical Decision Making This patient presents to the ED with chief complaint(s) of choking, vomiting, chest pain with pertinent past medical history of anxiety, restless leg which further complicates the presenting complaint. The complaint involves an extensive differential diagnosis and also carries with it a high risk of complications and morbidity.  The differential diagnosis includes high suspicion for food bolus impaction, also considering gastritis, GERD, atypical ACS, gastroenteritis, dehydration, electrolyte abnormality, pancreatitis, hepatitis  Additional history obtained: Additional history obtained from spouse Records reviewed Care Everywhere/External Records  ED Course and Reassessment: On patient's arrival she is hemodynamically stable in no acute distress.  Has been unable to tolerate p.o. and have high suspicion for food bolus impaction.  Patient will be given glucagon  and Zofran .  Will additionally have EKG, labs and chest x-ray to evaluate for alternative etiology of her symptoms.  If she does not have improvement and is unable to tolerate p.o. will need urgent GI evaluation.  Independent labs interpretation:  The following labs were independently interpreted: within normal range  Independent visualization of imaging: - I independently visualized the following imaging with scope of interpretation limited to determining acute life threatening conditions related to emergency care: CXR, which revealed no acute disease  Consultation: - Consulted or discussed management/test interpretation w/ external professional: GI  Consideration for admission or further workup: patient requires transfer for endoscopy Social Determinants of health: N/A    Amount and/or Complexity of Data Reviewed Labs: ordered. Radiology: ordered.  Risk Prescription drug management.          Final Clinical Impression(s) / ED Diagnoses Final  diagnoses:  Food impaction of esophagus, initial encounter    Rx / DC Orders ED Discharge Orders     None         Nolberto Batty, DO 06/10/23 2334

## 2023-06-10 NOTE — ED Notes (Signed)
 Patient states nausea has improved, but still uncomfortable. Continues to be unable to swallow own salvia

## 2023-06-10 NOTE — ED Triage Notes (Addendum)
 Pt has had food bolus since last night. Vomiting x 10. Has not been able to get it to clear. Pt has hx of this but never this bad. Usually able to clear it. In no respiratory distress. Talking in complete sentences but visibly uncomfortable. Clearing foam secretions.

## 2023-06-11 ENCOUNTER — Encounter (HOSPITAL_COMMUNITY)
Admission: EM | Disposition: A | Discharge status: Home / Self Care | Payer: Self-pay | Source: Home / Self Care | Attending: Emergency Medicine

## 2023-06-11 ENCOUNTER — Emergency Department (HOSPITAL_COMMUNITY): Admitting: Registered Nurse

## 2023-06-11 ENCOUNTER — Encounter (HOSPITAL_COMMUNITY): Payer: Self-pay | Admitting: *Deleted

## 2023-06-11 DIAGNOSIS — R1310 Dysphagia, unspecified: Secondary | ICD-10-CM

## 2023-06-11 DIAGNOSIS — K449 Diaphragmatic hernia without obstruction or gangrene: Secondary | ICD-10-CM | POA: Diagnosis not present

## 2023-06-11 DIAGNOSIS — K222 Esophageal obstruction: Secondary | ICD-10-CM

## 2023-06-11 DIAGNOSIS — K2100 Gastro-esophageal reflux disease with esophagitis, without bleeding: Secondary | ICD-10-CM | POA: Diagnosis not present

## 2023-06-11 DIAGNOSIS — W44F3XA Food entering into or through a natural orifice, initial encounter: Secondary | ICD-10-CM

## 2023-06-11 HISTORY — PX: ESOPHAGOGASTRODUODENOSCOPY: SHX5428

## 2023-06-11 HISTORY — PX: BIOPSY OF SKIN SUBCUTANEOUS TISSUE AND/OR MUCOUS MEMBRANE: SHX6741

## 2023-06-11 HISTORY — PX: BALLOON DILATION: SHX5330

## 2023-06-11 SURGERY — EGD (ESOPHAGOGASTRODUODENOSCOPY)
Anesthesia: General

## 2023-06-11 MED ORDER — LIDOCAINE VISCOUS HCL 2 % MT SOLN: 15.0000 mL | Freq: Four times a day (QID) | OROMUCOSAL | 0 refills | Status: DC | PRN

## 2023-06-11 MED ORDER — ONDANSETRON HCL 4 MG/2ML IJ SOLN
INTRAMUSCULAR | Status: DC | PRN
  Administered 2023-06-11: 4 mg via INTRAVENOUS

## 2023-06-11 MED ORDER — FENTANYL CITRATE (PF) 100 MCG/2ML IJ SOLN
INTRAMUSCULAR | Status: AC
Start: 2023-06-11 — End: ?
  Filled 2023-06-11: qty 2

## 2023-06-11 MED ORDER — FENTANYL CITRATE (PF) 250 MCG/5ML IJ SOLN
INTRAMUSCULAR | Status: DC | PRN
  Administered 2023-06-11: 75 ug via INTRAVENOUS

## 2023-06-11 MED ORDER — PROPOFOL 10 MG/ML IV BOLUS
INTRAVENOUS | Status: DC | PRN
  Administered 2023-06-11: 200 mg via INTRAVENOUS

## 2023-06-11 MED ORDER — SUCCINYLCHOLINE CHLORIDE 200 MG/10ML IV SOSY
PREFILLED_SYRINGE | INTRAVENOUS | Status: DC | PRN
  Administered 2023-06-11: 100 mg via INTRAVENOUS

## 2023-06-11 MED ORDER — LIDOCAINE 2% (20 MG/ML) 5 ML SYRINGE
INTRAMUSCULAR | Status: DC | PRN
  Administered 2023-06-11: 100 mg via INTRAVENOUS

## 2023-06-11 MED ORDER — ONDANSETRON HCL 4 MG/2ML IJ SOLN
4.0000 mg | Freq: Once | INTRAMUSCULAR | Status: AC
  Administered 2023-06-11: 4 mg via INTRAVENOUS
  Filled 2023-06-11: qty 2

## 2023-06-11 MED ORDER — DEXAMETHASONE SODIUM PHOSPHATE 10 MG/ML IJ SOLN
INTRAMUSCULAR | Status: DC | PRN
  Administered 2023-06-11: 5 mg via INTRAVENOUS

## 2023-06-11 MED ORDER — SODIUM CHLORIDE 0.9 % IV SOLN
INTRAVENOUS | Status: AC | PRN
  Administered 2023-06-11: 250 mL via INTRAVENOUS

## 2023-06-11 MED ORDER — MAGNESIUM SULFATE IN D5W 1-5 GM/100ML-% IV SOLN
1.0000 g | Freq: Once | INTRAVENOUS | Status: AC
  Administered 2023-06-11: 1 g via INTRAVENOUS
  Filled 2023-06-11: qty 100

## 2023-06-11 MED ORDER — PANTOPRAZOLE SODIUM 40 MG PO TBEC: 40.0000 mg | DELAYED_RELEASE_TABLET | Freq: Two times a day (BID) | ORAL | 6 refills | Status: AC

## 2023-06-11 NOTE — H&P (Signed)
 [facility] History and Physical   Primary Care Physician:  Sun, Vyvyan, MD   Reason for Procedure:   Food impaction  Plan:    EGD with disimpaction and possible intervention     HPI: Pamela Little is a 53 y.o. female came in with c/o difficulty swallowing handling secretion concerning for food impaction. She has been having progressive difficulty swallowing. This episode happened on Tuesday and prior to that similar episode on Sunday when she was able to regurgitate back.She has been having intermittent episodes of food impaction once every few months in the last 2-3 years. No abdominal pain, melena or rectal bleeding  The risks and benefits as well as alternatives of endoscopic procedure(s) have been discussed and reviewed. All questions answered. The patient agrees to proceed.    Past Medical History:  Diagnosis Date   Asthma    with activity   Depression    Migraine 03/21/2014   Panic attacks    Restless legs syndrome 03/21/2014   Tremor, essential 03/21/2014   Vertigo     Past Surgical History:  Procedure Laterality Date   ABLATION ON ENDOMETRIOSIS     CESAREAN SECTION WITH BILATERAL TUBAL LIGATION     MYRINGOTOMY WITH TUBE PLACEMENT     TONSILLECTOMY      Prior to Admission medications   Medication Sig Start Date End Date Taking? Authorizing Provider  albuterol (VENTOLIN HFA) 108 (90 Base) MCG/ACT inhaler  09/30/18  Yes [provider]  buPROPion  (WELLBUTRIN  XL) 150 MG 24 hr tablet Take 1 tablet (150 mg total) by mouth daily. 05/20/23  Yes Cottle, Kennedy Peabody., MD  estradiol (ESTRACE) 2 MG tablet Take 2 mg by mouth daily. 06/10/21  Yes [provider]  ipratropium (ATROVENT) 0.06 % nasal spray Place 2 sprays into both nostrils 3 (three) times daily as needed for rhinitis. 04/13/23  Yes [provider]  lisdexamfetamine (VYVANSE ) 60 MG capsule Take 1 capsule (60 mg total) by mouth daily. 02/24/23  Yes Cottle, Kennedy Peabody., MD   lisdexamfetamine (VYVANSE ) 60 MG capsule Take 1 capsule (60 mg total) by mouth daily. 04/27/23  Yes Cottle, Kennedy Peabody., MD  lisdexamfetamine (VYVANSE ) 60 MG capsule Take 1 capsule (60 mg total) by mouth daily. 05/20/23  Yes Cottle, Kennedy Peabody., MD  metroNIDAZOLE (METROCREAM) 0.75 % cream Apply 1 Application topically at bedtime. 04/28/23  Yes [provider]  progesterone (PROMETRIUM) 100 MG capsule Take 100 mg by mouth daily. 06/10/21  Yes [provider]  rOPINIRole  (REQUIP  XL) 4 MG 24 hr tablet Take 1 tablet (4 mg total) by mouth at bedtime. 05/20/23  Yes Cottle, Kennedy Peabody., MD  rOPINIRole  (REQUIP ) 1 MG tablet Take 1 tablet (1 mg total) by mouth at bedtime as needed (restless legs). 05/20/23  Yes Cottle, Kennedy Peabody., MD  sertraline  (ZOLOFT ) 100 MG tablet Take 1.5 tablets (150 mg total) by mouth daily. 05/20/23  Yes Cottle, Kennedy Peabody., MD  traZODone  (DESYREL ) 50 MG tablet Take 1-2 tablets (50-100 mg total) by mouth at bedtime. 05/20/23  Yes Cottle, Kennedy Peabody., MD  VYVANSE  60 MG capsule Take 1 capsule (60 mg total) by mouth every morning. 05/20/23  Yes Cottle, Kennedy Peabody., MD    Current Facility-Administered Medications  Medication Dose Route Frequency Provider Last Rate Last Admin   0.9 %  sodium chloride  infusion    Continuous PRN Violetta Lavalle V, MD   250 mL at 06/11/23 0920   lactated ringers   infusion   Intravenous Continuous Kingsley, Victoria K, DO   Stopped at 06/11/23 1610    Allergies as of 06/10/2023   (No Known Allergies)    Family History  Problem Relation Age of Onset   Hypertension Mother    Hypertension Father    Tremor Sister    Heart attack Neg Hx    Diabetes Neg Hx    Colon cancer Neg Hx    Colon polyps Neg Hx    Stomach cancer Neg Hx    Esophageal cancer Neg Hx     Social History   Socioeconomic History   Marital status: Married    Spouse name: Not on file   Number of children: 3   Years of education: college   Highest education level: Not on  file  Occupational History   Not on file  Tobacco Use   Smoking status: Never   Smokeless tobacco: Never  Vaping Use   Vaping status: Never Used  Substance and Sexual Activity   Alcohol use: Yes    Alcohol/week: 1.0 standard drink of alcohol    Types: 1 Cans of beer per week    Comment: occasionaly   Drug use: Never   Sexual activity: Not on file  Other Topics Concern   Not on file  Social History Narrative   Patient is right handed.   Patient drinks 1 cup caffeine daily.   Social Drivers of Corporate investment banker Strain: Not on file  Food Insecurity: Not on file  Transportation Needs: Not on file  Physical Activity: Not on file  Stress: Not on file  Social Connections: Not on file  Intimate Partner Violence: Unknown (05/23/2021)   Received from [facility]   HITS    Physically Hurt: Not on file    Insult or Talk Down To: Not on file    Threaten Physical Harm: Not on file    Scream or Curse: Not on file    Review of Systems:  All other review of systems negative except as mentioned in the HPI.  Physical Exam: Vital signs in last 24 hours: Temp:  [97.1 F (36.2 C)-98.5 F (36.9 C)] 97.1 F (36.2 C) (04/24 0910) Pulse Rate:  [53-84] 59 (04/24 0910) Resp:  [14-25] 18 (04/24 0910) BP: (100-144)/(55-108) 116/78 (04/24 0910) SpO2:  [93 %-100 %] 98 % (04/24 0910) Weight:  [92.1 kg] 92.1 kg (04/24 0910)   General:   Alert, NAD Lungs:  Clear .   Heart:  Regular rate and rhythm Abdomen:  Soft, nontender and nondistended. Neuro/Psych:  Alert and cooperative. Normal mood and affect. A and O x 3   K. Veena Dahlia Nifong , MD [phone]

## 2023-06-11 NOTE — Op Note (Signed)
 [facility] Patient Name: Pamela Little Procedure Date : 06/11/2023 MRN: [id number] Attending MD: Sergio Dandy , MD, [id number] Date of Birth: 10-09-1970 CSN: [id number] Age: 53 Admit Type: Inpatient Procedure:                Upper GI endoscopy Indications:              Esophageal dysphagia Providers:                Sergio Dandy, MD, Millicent Ally, RN, Joline Ned, Technician Referring MD:              Medicines:                General Anesthesia Complications:            No immediate complications. Estimated Blood Loss:     Estimated blood loss was minimal. Procedure:                Pre-Anesthesia Assessment:                           - Prior to the procedure, a History and Physical                            was performed, and patient medications and                            allergies were reviewed. The patient's tolerance of                            previous anesthesia was also reviewed. The risks                            and benefits of the procedure and the sedation                            options and risks were discussed with the patient.                            All questions were answered, and informed consent                            was obtained. Prior Anticoagulants: The patient has                            taken no anticoagulant or antiplatelet agents. ASA                            Grade Assessment: II - A patient with mild systemic                            disease. After reviewing the risks and benefits,  the patient was deemed in satisfactory condition to                            undergo the procedure.                           After obtaining informed consent, the endoscope was                            passed under direct vision. Throughout the                            procedure, the patient's blood pressure, pulse, and                            oxygen saturations  were monitored continuously. The                            [id number]-[id number] ([id number]) Olympus endoscope was introduced                            through the mouth, and advanced to the second part                            of duodenum. The upper GI endoscopy was                            accomplished without difficulty. The patient                            tolerated the procedure well. Scope In: Scope Out: Findings:      LA Grade B (one or more mucosal breaks greater than 5 mm, not extending       between the tops of two mucosal folds) esophagitis with no bleeding was       found 36 to 38 cm from the incisors. Biopsies were obtained from the       proximal and distal esophagus with cold forceps for histology of       suspected eosinophilic esophagitis.      One benign-appearing, intrinsic mild stenosis was found 37 to 38 cm from       the incisors. This stenosis measured 1.8 cm (inner diameter) x less than       one cm (in length). The stenosis was traversed. A TTS dilator was passed       through the scope. Dilation with an 18-19-20 mm x 8 cm CRE balloon       dilator was performed to 20 mm. The dilation site was examined following       endoscope reinsertion and showed mild mucosal disruption and moderate       improvement in luminal narrowing.      A 3 cm hiatal hernia was present.      The stomach was normal.      The cardia and gastric fundus were normal on retroflexion.      The examined duodenum was normal. Impression:               -  LA Grade B reflux esophagitis with no bleeding.                           - Benign-appearing esophageal stenosis. Dilated.                           - 3 cm hiatal hernia.                           - Normal stomach.                           - Normal examined duodenum.                           - Biopsies were taken with a cold forceps for                            evaluation of eosinophilic esophagitis. Recommendation:           - Patient has a  contact number available for                            emergencies. The signs and symptoms of potential                            delayed complications were discussed with the                            patient. Return to normal activities tomorrow.                            Written discharge instructions were provided to the                            patient.                           - Discharge patient to home (with spouse).                           - Continue present medications.                           - Await pathology results.                           - Clear liquid diet - advance as tolerated to                            mechanical soft diet.                           - No high dose aspirin, ibuprofen, naproxen, or                            other non-steroidal anti-inflammatory drugs.                           -  Use Protonix  (pantoprazole ) 40 mg PO BID.                           - Follow an antireflux regimen.                           - Follow up in GI office next available with                            Dr.Lauryl Seyer or APP and will plan for repeat EGD in                            6-8 weeks Procedure Code(s):        --- Professional ---                           [phone], Esophagogastroduodenoscopy, flexible,                            transoral; with transendoscopic balloon dilation of                            esophagus (less than 30 mm diameter)                           [id number], 59, Esophagogastroduodenoscopy, flexible,                            transoral; with biopsy, single or multiple Diagnosis Code(s):        --- Professional ---                           K21.00, Gastro-esophageal reflux disease with                            esophagitis, without bleeding                           K22.2, Esophageal obstruction                           K44.9, Diaphragmatic hernia without obstruction or                            gangrene                           R13.14,  Dysphagia, pharyngoesophageal phase CPT copyright 2022 American Medical Association. All rights reserved. The codes documented in this report are preliminary and upon coder review may  be revised to meet current compliance requirements. Pamela Salehi V. Stancil Deisher, MD 06/11/2023 10:36:22 AM This report has been signed electronically. Number of Addenda: 0

## 2023-06-11 NOTE — Discharge Instructions (Signed)

## 2023-06-11 NOTE — ED Notes (Signed)
 Sent from [facility] for Endo due to food bolus.

## 2023-06-11 NOTE — ED Notes (Signed)
 Patient c/o continued restless leg issue. Does appear to be handling secretions well at present.  V/s stable

## 2023-06-11 NOTE — Anesthesia Preprocedure Evaluation (Addendum)
 Anesthesia Evaluation  Patient identified by MRN, date of birth, ID band Patient awake    Reviewed: Allergy & Precautions, NPO status , Patient's Chart, lab work & pertinent test results  History of Anesthesia Complications Negative for: history of anesthetic complications  Airway Mallampati: II  TM Distance: >3 FB Neck ROM: Full    Dental no notable dental hx. (+) Teeth Intact, Dental Advisory Given   Pulmonary sleep apnea    Pulmonary exam normal breath sounds clear to auscultation       Cardiovascular Exercise Tolerance: Good (-) hypertension(-) angina (-) Past MI Normal cardiovascular exam Rhythm:Regular Rate:Normal  Nl EF   Neuro/Psych  PSYCHIATRIC DISORDERS         GI/Hepatic negative GI ROS, Neg liver ROS,,,  Endo/Other    Renal/GU Lab Results      Component                Value               Date                        K                        4.0                 06/10/2023                             CREATININE               0.99                06/10/2023                  Musculoskeletal   Abdominal   Peds  Hematology Lab Results      Component                Value               Date                      WBC                      6.5                 06/10/2023                HGB                      14.7                06/10/2023                HCT                      42.4                06/10/2023                MCV                      91.0                06/10/2023  PLT                      240                 06/10/2023              Anesthesia Other Findings   Reproductive/Obstetrics                             Anesthesia Physical Anesthesia Plan  ASA: 2 and emergent  Anesthesia Plan: General   Post-op Pain Management: Minimal or no pain anticipated   Induction: Rapid sequence, Cricoid pressure planned and Intravenous  PONV Risk Score and Plan:  Treatment may vary due to age or medical condition, Ondansetron  and Dexamethasone   Airway Management Planned: Oral ETT  Additional Equipment:   Intra-op Plan:   Post-operative Plan: Extubation in OR  Informed Consent: I have reviewed the patients History and Physical, chart, labs and discussed the procedure including the risks, benefits and alternatives for the proposed anesthesia with the patient or authorized representative who has indicated his/her understanding and acceptance.     Dental advisory given  Plan Discussed with: CRNA and Surgeon  Anesthesia Plan Comments:        Anesthesia Quick Evaluation

## 2023-06-11 NOTE — ED Notes (Signed)
 Ice pack given, patient c/o continued restless leg

## 2023-06-11 NOTE — Transfer of Care (Signed)
 Immediate Anesthesia Transfer of Care Note  Patient: Pamela Little  Procedure(s) Performed: EGD (ESOPHAGOGASTRODUODENOSCOPY) BALLOON DILATION BIOPSY, SKIN, SUBCUTANEOUS TISSUE, OR MUCOUS MEMBRANE  Patient Location: PACU  Anesthesia Type:General  Level of Consciousness: awake and alert   Airway & Oxygen Therapy: Patient Spontanous Breathing and Patient connected to face mask oxygen  Post-op Assessment: Report given to RN, Post -op Vital signs reviewed and stable, and Patient moving all extremities X 4  Post vital signs: Reviewed and stable  Last Vitals:  Vitals Value Taken Time  BP 120/87 06/11/23 1030  Temp    Pulse 60 06/11/23 1031  Resp 14 06/11/23 1031  SpO2 95 % 06/11/23 1031  Vitals shown include unfiled device data.  Last Pain:  Vitals:   06/11/23 0910  TempSrc: Temporal  PainSc: 3          Complications: No notable events documented.

## 2023-06-11 NOTE — Anesthesia Postprocedure Evaluation (Signed)
 Anesthesia Post Note  Patient: Pamela Little  Procedure(s) Performed: EGD (ESOPHAGOGASTRODUODENOSCOPY) BALLOON DILATION BIOPSY, SKIN, SUBCUTANEOUS TISSUE, OR MUCOUS MEMBRANE     Patient location during evaluation: Endoscopy Anesthesia Type: MAC Level of consciousness: awake and alert Pain management: pain level controlled Vital Signs Assessment: post-procedure vital signs reviewed and stable Respiratory status: spontaneous breathing, nonlabored ventilation, respiratory function stable and patient connected to nasal cannula oxygen Cardiovascular status: blood pressure returned to baseline and stable Postop Assessment: no apparent nausea or vomiting Anesthetic complications: no  No notable events documented.  Last Vitals:  Vitals:   06/11/23 1040 06/11/23 1100  BP: 110/76 122/81  Pulse: 64 63  Resp: 10 13  Temp:    SpO2: 94% 97%    Last Pain:  Vitals:   06/11/23 1100  TempSrc:   PainSc: 0-No pain                 Rosalita Combe

## 2023-06-11 NOTE — Anesthesia Procedure Notes (Signed)
 Procedure Name: Intubation Date/Time: 06/11/2023 10:04 AM  Performed by: Merna Aase, CRNAPre-anesthesia Checklist: Patient identified, Patient being monitored, Timeout performed, Emergency Drugs available and Suction available Patient Re-evaluated:Patient Re-evaluated prior to induction Oxygen Delivery Method: Circle system utilized Preoxygenation: Pre-oxygenation with 100% oxygen Induction Type: IV induction Ventilation: Mask ventilation without difficulty Laryngoscope Size: Mac and 4 Grade View: Grade I Tube type: Oral Tube size: 7.0 mm Number of attempts: 1 Airway Equipment and Method: Stylet Placement Confirmation: ETT inserted through vocal cords under direct vision, positive ETCO2 and breath sounds checked- equal and bilateral Secured at: 22 cm Tube secured with: Tape Dental Injury: Teeth and Oropharynx as per pre-operative assessment

## 2023-06-12 LAB — SURGICAL PATHOLOGY

## 2023-06-14 ENCOUNTER — Encounter (HOSPITAL_COMMUNITY): Payer: Self-pay | Admitting: Gastroenterology

## 2023-06-15 ENCOUNTER — Telehealth: Payer: Self-pay | Admitting: Gastroenterology

## 2023-06-15 NOTE — Telephone Encounter (Signed)
 Inbound call from patient, would like to schedule a hospital follow up with Dr. Leonia Raman, patient was advised at this moment she did not have any availability. Patient requested to speak to a nurse to discuss appointment.

## 2023-06-16 NOTE — Telephone Encounter (Signed)
 Clifford thanks

## 2023-06-16 NOTE — Telephone Encounter (Signed)
 Patient contacted and scheduled.  Pamela Little, you do not need to call her. (See procedure report)

## 2023-06-22 DIAGNOSIS — K222 Esophageal obstruction: Secondary | ICD-10-CM | POA: Diagnosis not present

## 2023-06-22 DIAGNOSIS — K2100 Gastro-esophageal reflux disease with esophagitis, without bleeding: Secondary | ICD-10-CM | POA: Diagnosis not present

## 2023-07-03 ENCOUNTER — Other Ambulatory Visit: Payer: Self-pay | Admitting: Psychiatry

## 2023-07-03 DIAGNOSIS — F902 Attention-deficit hyperactivity disorder, combined type: Secondary | ICD-10-CM

## 2023-07-06 ENCOUNTER — Other Ambulatory Visit: Payer: Self-pay | Admitting: Psychiatry

## 2023-07-06 ENCOUNTER — Other Ambulatory Visit (HOSPITAL_COMMUNITY): Payer: Self-pay

## 2023-07-06 DIAGNOSIS — Z01419 Encounter for gynecological examination (general) (routine) without abnormal findings: Secondary | ICD-10-CM | POA: Diagnosis not present

## 2023-07-06 DIAGNOSIS — Z6832 Body mass index (BMI) 32.0-32.9, adult: Secondary | ICD-10-CM | POA: Diagnosis not present

## 2023-07-06 DIAGNOSIS — F902 Attention-deficit hyperactivity disorder, combined type: Secondary | ICD-10-CM

## 2023-07-06 DIAGNOSIS — Z124 Encounter for screening for malignant neoplasm of cervix: Secondary | ICD-10-CM | POA: Diagnosis not present

## 2023-07-06 MED ORDER — LISDEXAMFETAMINE DIMESYLATE 60 MG PO CAPS
60.0000 mg | ORAL_CAPSULE | Freq: Every day | ORAL | 0 refills | Status: DC
  Filled 2023-08-03 – 2023-08-04 (×2): qty 30, 30d supply, fill #0

## 2023-07-06 MED ORDER — LISDEXAMFETAMINE DIMESYLATE 60 MG PO CAPS
60.0000 mg | ORAL_CAPSULE | Freq: Every day | ORAL | 0 refills | Status: DC
  Filled 2023-07-06: qty 30, 30d supply, fill #0

## 2023-07-06 MED ORDER — VYVANSE 60 MG PO CAPS
60.0000 mg | ORAL_CAPSULE | ORAL | 0 refills | Status: DC
  Filled 2023-09-09: qty 30, 30d supply, fill #0

## 2023-07-07 ENCOUNTER — Other Ambulatory Visit (HOSPITAL_COMMUNITY): Payer: Self-pay

## 2023-07-07 DIAGNOSIS — Z13220 Encounter for screening for lipoid disorders: Secondary | ICD-10-CM | POA: Diagnosis not present

## 2023-07-07 DIAGNOSIS — J309 Allergic rhinitis, unspecified: Secondary | ICD-10-CM | POA: Diagnosis not present

## 2023-07-07 DIAGNOSIS — Z0000 Encounter for general adult medical examination without abnormal findings: Secondary | ICD-10-CM | POA: Diagnosis not present

## 2023-07-07 DIAGNOSIS — J4520 Mild intermittent asthma, uncomplicated: Secondary | ICD-10-CM | POA: Diagnosis not present

## 2023-07-07 DIAGNOSIS — J019 Acute sinusitis, unspecified: Secondary | ICD-10-CM | POA: Diagnosis not present

## 2023-07-24 DIAGNOSIS — G4733 Obstructive sleep apnea (adult) (pediatric): Secondary | ICD-10-CM | POA: Diagnosis not present

## 2023-07-27 IMAGING — MG MM DIGITAL SCREENING BILAT W/ TOMO AND CAD
8 series · 8 of 24 positions shown · non-contrast
Comparison: Previous exam(s).

CLINICAL DATA: Screening.

EXAM:
DIGITAL SCREENING BILATERAL MAMMOGRAM WITH TOMOSYNTHESIS AND CAD
TECHNIQUE: Bilateral screening digital craniocaudal and mediolateral oblique
mammograms were obtained. Bilateral screening digital breast
tomosynthesis was performed. The images were evaluated with
computer-aided detection.

[R CC synth-2D]
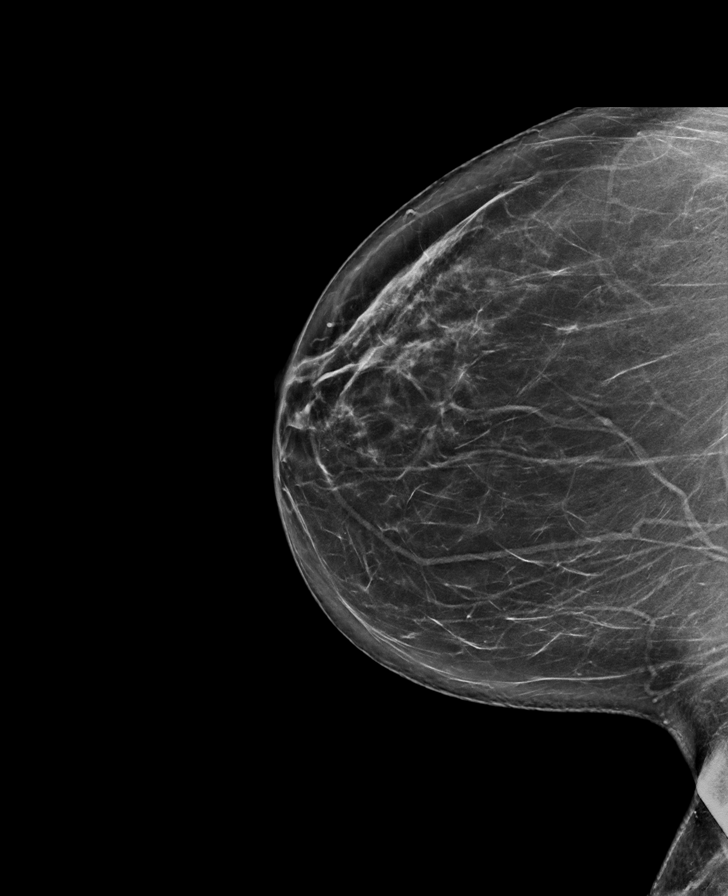

[L CC synth-2D]
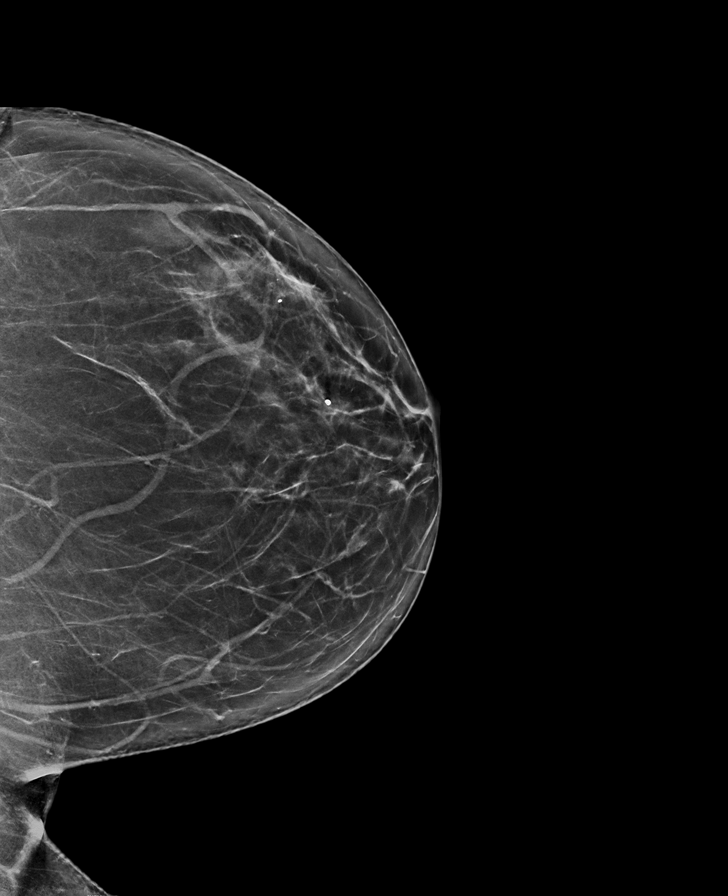

[R MLO synth-2D]
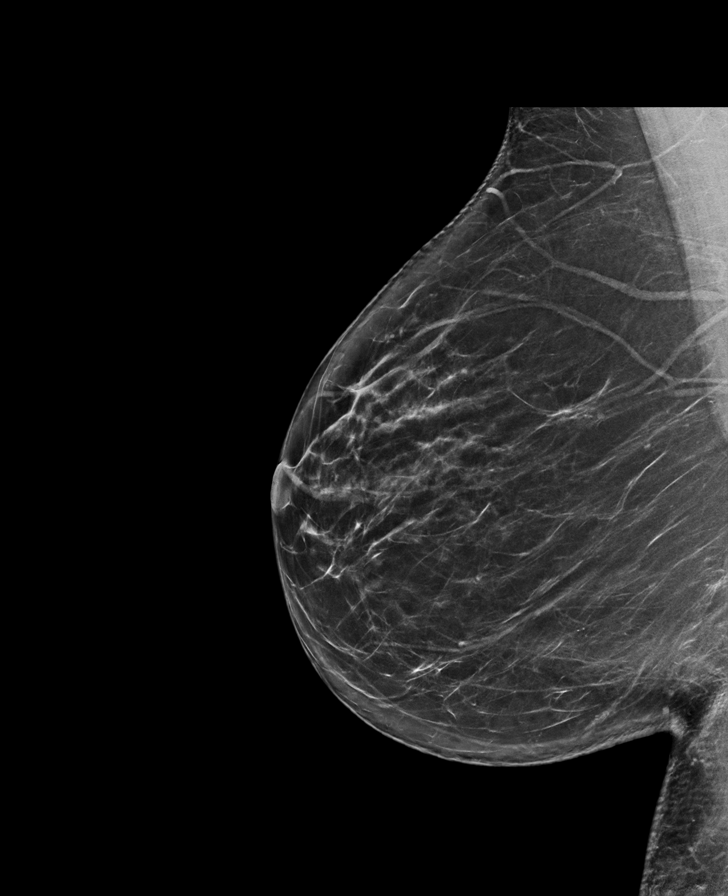

[L MLO synth-2D]
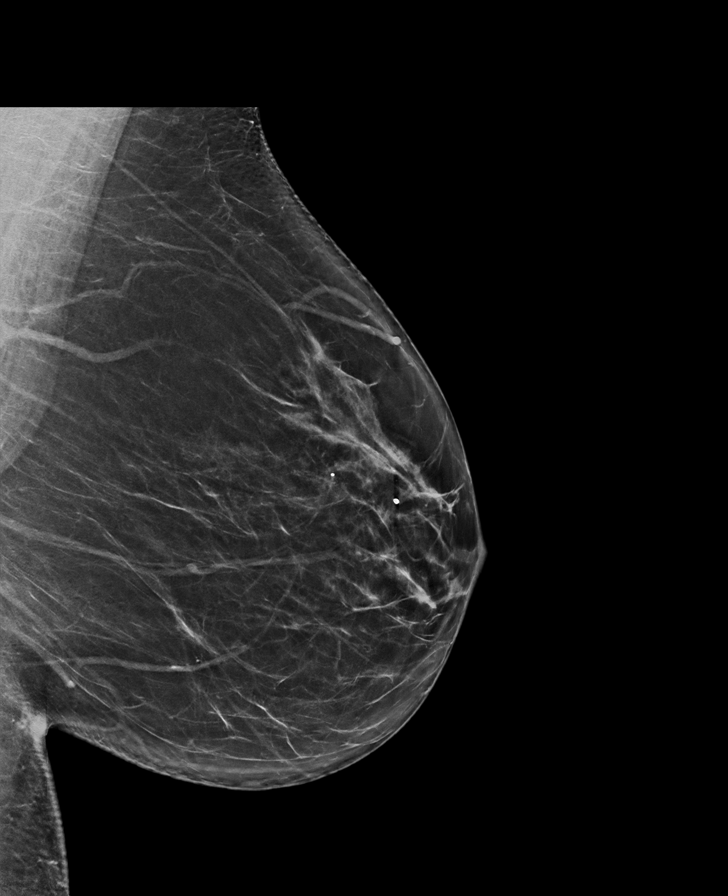

[R CC tomo · tomo slice 38/75.0]
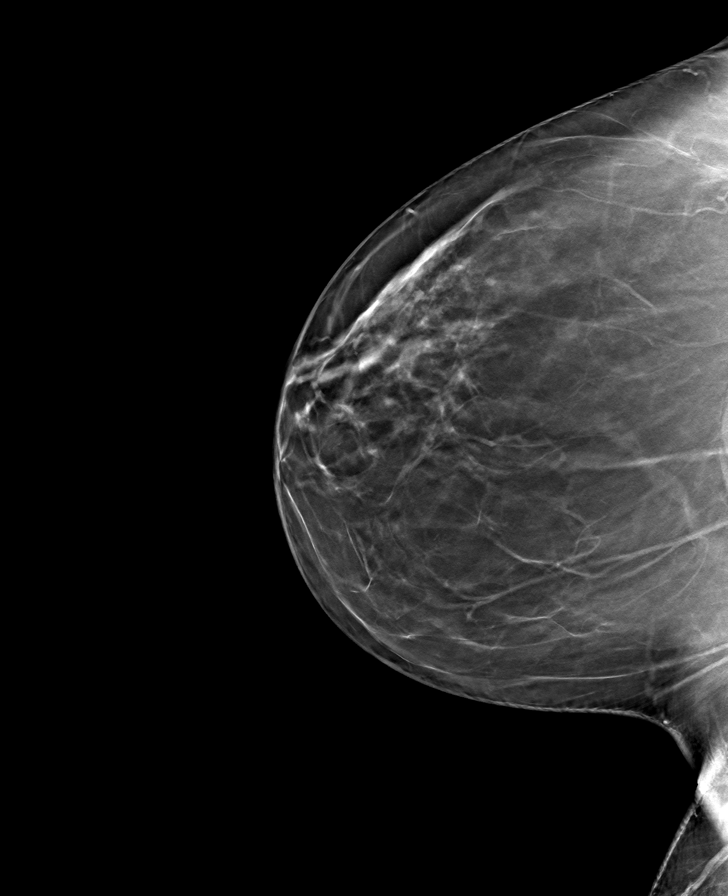

[L MLO tomo · tomo slice 39/77.0]
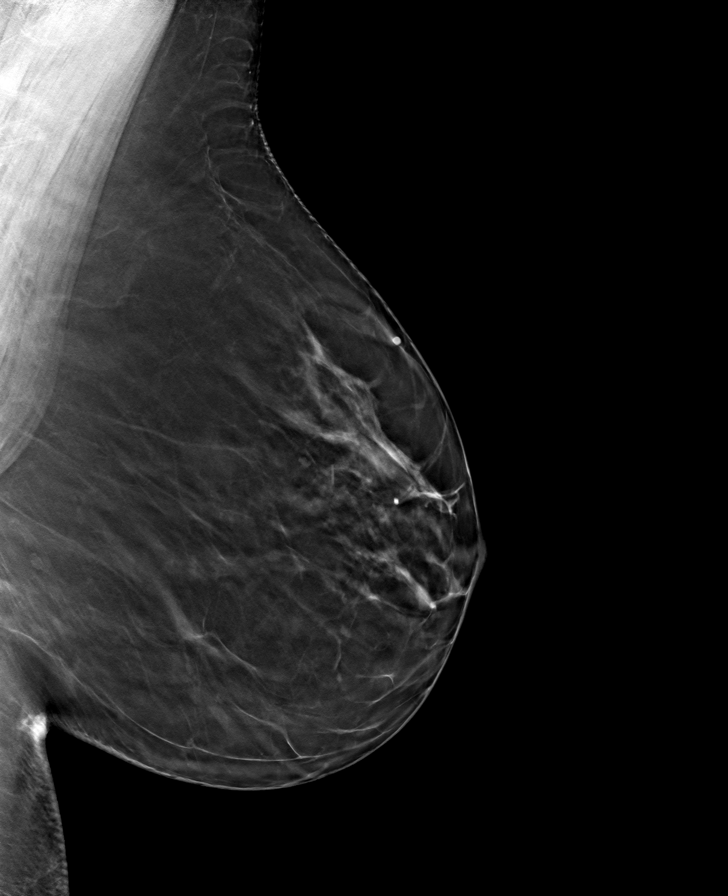

[L CC tomo · tomo slice 37/72.0]
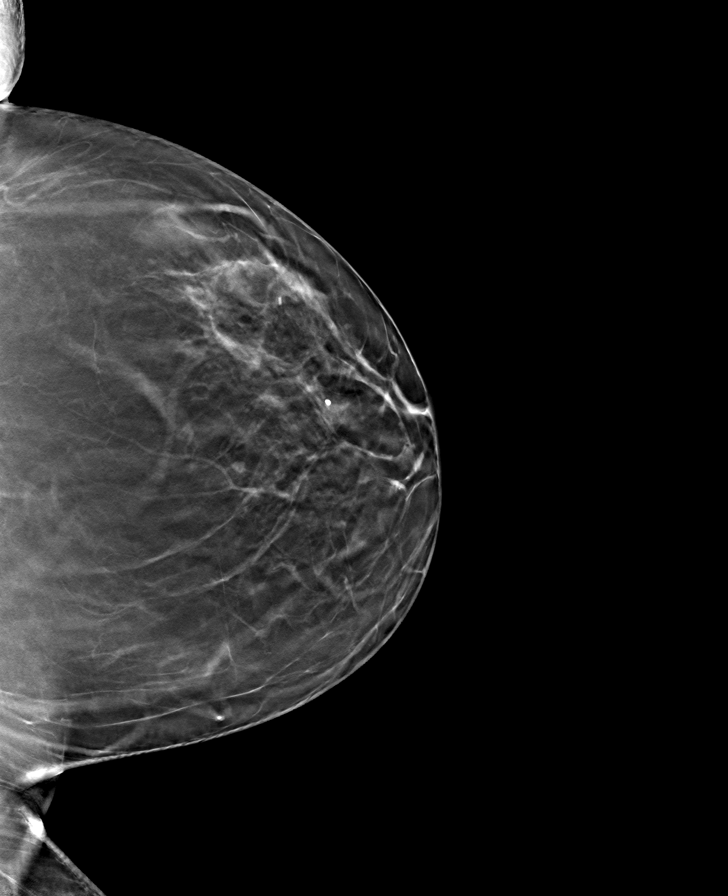

[R MLO tomo · tomo slice 38/75.0]
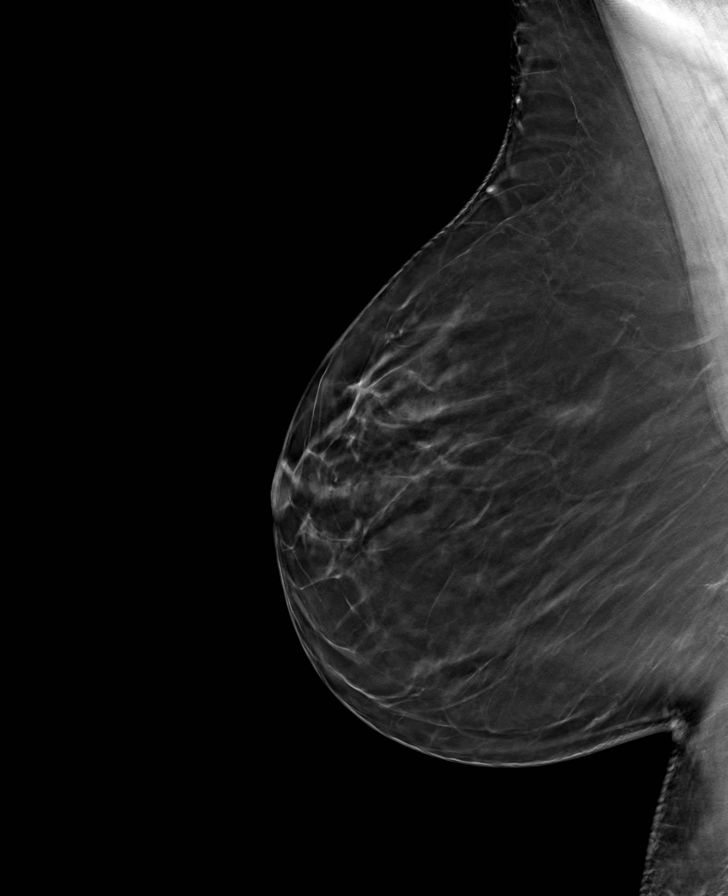

[8 of 24 positions shown; findings below may reference images not displayed]

ACR Breast Density Category b: There are scattered areas of
fibroglandular density.
FINDINGS: There are no findings suspicious for malignancy.
IMPRESSION: No mammographic evidence of malignancy. A result letter of this
screening mammogram will be mailed directly to the patient.

RECOMMENDATION:
Screening mammogram in one year. (Code:51-O-LD2)

BI-RADS CATEGORY  1: Negative.

## 2023-08-03 ENCOUNTER — Other Ambulatory Visit (HOSPITAL_COMMUNITY): Payer: Self-pay

## 2023-08-04 ENCOUNTER — Other Ambulatory Visit (HOSPITAL_COMMUNITY): Payer: Self-pay

## 2023-08-05 ENCOUNTER — Encounter: Payer: Self-pay | Admitting: Gastroenterology

## 2023-08-05 ENCOUNTER — Ambulatory Visit: Admitting: Gastroenterology

## 2023-08-05 VITALS — BP 116/80 | HR 88 | Ht 68.0 in | Wt 207.6 lb

## 2023-08-05 DIAGNOSIS — K2100 Gastro-esophageal reflux disease with esophagitis, without bleeding: Secondary | ICD-10-CM | POA: Diagnosis not present

## 2023-08-05 DIAGNOSIS — R059 Cough, unspecified: Secondary | ICD-10-CM

## 2023-08-05 DIAGNOSIS — R1310 Dysphagia, unspecified: Secondary | ICD-10-CM | POA: Diagnosis not present

## 2023-08-05 DIAGNOSIS — R1319 Other dysphagia: Secondary | ICD-10-CM

## 2023-08-05 DIAGNOSIS — Z8719 Personal history of other diseases of the digestive system: Secondary | ICD-10-CM

## 2023-08-05 MED ORDER — FAMOTIDINE 20 MG PO TABS: 20.0000 mg | ORAL_TABLET | Freq: Every day | ORAL | 0 refills | Status: DC

## 2023-08-05 NOTE — Patient Instructions (Addendum)
 VISIT SUMMARY:  During your visit, we discussed your ongoing symptoms of acid reflux and difficulty swallowing. We reviewed your current treatment plan and made some adjustments to better manage your symptoms. We also discussed potential future interventions if your symptoms do not improve.  YOUR PLAN:  -GASTROESOPHAGEAL REFLUX DISEASE (GERD): GERD is a condition where stomach acid frequently flows back into the tube connecting your mouth and stomach, causing irritation. We will increase your Protonix  dosage to twice daily before breakfast and dinner for 6-8 weeks. You should also take famotidine  20 mg after dinner if you have a late or heavy meal. We scheduled an upper endoscopy on August 14th at 7 AM to check for esophageal irritation and determine if further intervention is needed. Additionally, please avoid caffeine, chocolate, and alcohol in the evening, elevate the head of your bed, avoid late meals, and do not lie flat after eating. If medication management is not enough, we may consider antireflux surgery, but this will require a test called esophageal manometry to ensure your swallowing function is good.  -COUGH DUE TO GERD: Your cough is likely caused by acid reflux, which irritates your throat and airways. By managing your GERD more aggressively with the increased Protonix  dosage and lifestyle changes, we expect your cough to improve. If your cough persists after controlling GERD, we may refer you to a lung specialist for further evaluation.  -DIFFICULTY SWALLOWING DUE TO GERD: Your difficulty swallowing is likely due to irritation in your esophagus from acid reflux. You have had esophageal dilation before, which helped initially. We will perform an upper endoscopy on August 14th to evaluate the irritation and see if you need another dilation.  INSTRUCTIONS:  Please remember to take Protonix  twice daily before breakfast and dinner for the next 6-8 weeks. Take famotidine  20 mg after dinner if  you have a late or heavy meal. Avoid caffeine, chocolate, and alcohol in the evening, elevate the head of your bed, avoid late meals, and do not lie flat after eating. Your upper endoscopy is scheduled for August 14th at 7 AM. If your cough persists after controlling GERD, we may refer you to a lung specialist for further evaluation.  You have been scheduled for an endoscopy. Please follow written instructions given to you at your visit today.  If you use inhalers (even only as needed), please bring them with you on the day of your procedure.  If you take any of the following medications, they will need to be adjusted prior to your procedure:   DO NOT TAKE 7 DAYS PRIOR TO TEST- Trulicity (dulaglutide) Ozempic, Wegovy (semaglutide) Mounjaro (tirzepatide) Bydureon Bcise (exanatide extended release)  DO NOT TAKE 1 DAY PRIOR TO YOUR TEST Rybelsus (semaglutide) Adlyxin (lixisenatide) Victoza (liraglutide) Byetta (exanatide) ___________________________________________________________________________    I appreciate the  opportunity to care for you  Thank You   Kavitha Nandigam , MD

## 2023-08-05 NOTE — Progress Notes (Signed)
 Pamela Little    [id number]    [birth year]  Primary Care Physician:Sun, Vyvyan, MD  Referring Physician: Sun, Vyvyan, MD [phone] [place],  [place] [address]   Chief complaint:  GERD  Discussed the use of AI scribe software for clinical note transcription with the patient, who gave verbal consent to proceed.  History of Present Illness Pamela Little is a 53 year old female with acid reflux who presents with ongoing symptoms of acid reflux and difficulty swallowing.  She has experienced acid reflux symptoms since pregnancy, characterized by coughing and a sensation of losing breath. Symptoms are exacerbated by chocolate, wine, and caffeine, particularly in the evening. She occasionally uses an inhaler during symptom flare-ups.  She underwent esophageal dilation, which initially improved her swallowing difficulties, but she continues to experience throat irritation and tightness. She is prescribed Protonix  once daily but acknowledges she should take it twice daily. She has been on this medication for about two months.  Her lifestyle is busy, often leading to irregular eating due to her work schedule. At home, she tries to eat with her family but sometimes forgets to take her medication despite carrying it with her.  She recalls having the flu approximately six weeks ago, followed by another illness, leading to increased coughing. She reports increased coughing following recent illnesses.  She has a history of sleep apnea and has been trying different masks to improve breathing at night. She experiences breathing issues at night, potentially exacerbated by GERD.  She mentions vertigo issues and is a side sleeper, affecting her comfort and sleep quality.     EGD 06/11/23 - LA Grade B reflux esophagitis with no bleeding. - Benign- appearing esophageal stenosis. Dilated. - 3 cm hiatal hernia. - Normal stomach. - Normal examined duodenum. - Biopsies were taken  with a cold forceps for evaluation of eosinophilic esophagitis.  A. ESOPHAGUS, DISTAL, BIOPSY:  - Chronic esophagitis with patchy mildly increased intraepithelial  eosinophils (up to 25/hpf) without significant degranulation or  superficial layering, see comment   B. ESOPHAGUS, PROXIMAL, BIOPSY:  - Esophageal squamous mucosa with no specific histopathologic changes  - Negative for increased intraepithelial eosinophils    Outpatient Encounter Medications as of 08/05/2023  Medication Sig   albuterol (VENTOLIN HFA) 108 (90 Base) MCG/ACT inhaler    buPROPion  (WELLBUTRIN  XL) 150 MG 24 hr tablet Take 1 tablet (150 mg total) by mouth daily.   ipratropium (ATROVENT) 0.06 % nasal spray Place 2 sprays into both nostrils 3 (three) times daily as needed for rhinitis.   metroNIDAZOLE (METROCREAM) 0.75 % cream Apply 1 Application topically at bedtime.   pantoprazole  (PROTONIX ) 40 MG tablet Take 1 tablet (40 mg total) by mouth 2 (two) times daily before a meal.   progesterone (PROMETRIUM) 100 MG capsule Take 100 mg by mouth daily.   rOPINIRole  (REQUIP  XL) 4 MG 24 hr tablet Take 1 tablet (4 mg total) by mouth at bedtime.   sertraline  (ZOLOFT ) 100 MG tablet Take 1.5 tablets (150 mg total) by mouth daily.   traZODone  (DESYREL ) 50 MG tablet Take 1-2 tablets (50-100 mg total) by mouth at bedtime.   [START ON 08/31/2023] VYVANSE  60 MG capsule Take 1 capsule (60 mg total) by mouth every morning. (08/31/23)   estradiol (ESTRACE) 2 MG tablet Take 2 mg by mouth daily.   lidocaine  (XYLOCAINE ) 2 % solution Use as directed 15 mLs in the mouth or throat every 6 (  six) hours as needed for mouth pain.   lisdexamfetamine (VYVANSE ) 60 MG capsule Take 1 capsule (60 mg total) by mouth daily.   lisdexamfetamine (VYVANSE ) 60 MG capsule Take 1 capsule (60 mg total) by mouth daily.   lisdexamfetamine (VYVANSE ) 60 MG capsule Take 1 capsule (60 mg total) by mouth daily. (08/03/23)   rOPINIRole  (REQUIP ) 1 MG tablet Take 1 tablet (1 mg  total) by mouth at bedtime as needed (restless legs).   No facility-administered encounter medications on file as of 08/05/2023.    Allergies as of 08/05/2023   (No Known Allergies)    Past Medical History:  Diagnosis Date   Asthma    with activity   Depression    Migraine 03/21/2014   Panic attacks    Restless legs syndrome 03/21/2014   Tremor, essential 03/21/2014   Vertigo     Past Surgical History:  Procedure Laterality Date   ABLATION ON ENDOMETRIOSIS     BALLOON DILATION N/A 06/11/2023   Procedure: BALLOON DILATION;  Surgeon: Sergio Dandy, MD;  Location: [facility] ENDOSCOPY;  Service: Gastroenterology;  Laterality: N/A;   BIOPSY OF SKIN SUBCUTANEOUS TISSUE AND/OR MUCOUS MEMBRANE  06/11/2023   Procedure: BIOPSY, SKIN, SUBCUTANEOUS TISSUE, OR MUCOUS MEMBRANE;  Surgeon: Sergio Dandy, MD;  Location: [facility] ENDOSCOPY;  Service: Gastroenterology;;   CESAREAN SECTION WITH BILATERAL TUBAL LIGATION     ESOPHAGOGASTRODUODENOSCOPY N/A 06/11/2023   Procedure: EGD (ESOPHAGOGASTRODUODENOSCOPY);  Surgeon: Linday Rhodes V, MD;  Location[facility] ENDOSCOPY;  Service: Gastroenterology;  Laterality: N/A;   MYRINGOTOMY WITH TUBE PLACEMENT     TONSILLECTOMY      Family History  Problem Relation Age of Onset   Hypertension Mother    Hypertension Father    Tremor Sister    Heart attack Neg Hx    Diabetes Neg Hx    Colon cancer Neg Hx    Colon polyps Neg Hx    Stomach cancer Neg Hx    Esophageal cancer Neg Hx     Social History   Socioeconomic History   Marital status: Married    Spouse name: Not on file   Number of children: 3   Years of education: college   Highest education level: Not on file  Occupational History   Not on file  Tobacco Use   Smoking status: Never   Smokeless tobacco: Never  Vaping Use   Vaping status: Never Used  Substance and Sexual Activity   Alcohol use: Yes    Alcohol/week: 1.0 standard drink of alcohol    Types: 1 Cans of beer per week     Comment: occasionaly   Drug use: Never   Sexual activity: Not on file  Other Topics Concern   Not on file  Social History Narrative   Patient is right handed.   Patient drinks 1 cup caffeine daily.   Social Drivers of Corporate investment banker Strain: Not on file  Food Insecurity: Not on file  Transportation Needs: Not on file  Physical Activity: Not on file  Stress: Not on file  Social Connections: Not on file  Intimate Partner Violence: Unknown (05/23/2021)   Received from [facility]   HITS    Physically Hurt: Not on file    Insult or Talk Down To: Not on file    Threaten Physical Harm: Not on file    Scream or Curse: Not on file      Review of systems: All other review of systems negative except as mentioned in  the HPI.   Physical Exam: Vitals:   08/05/23 1056  BP: 116/80  Pulse: 88   Body mass index is 31.57 kg/m. Gen:      No acute distress HEENT:  sclera anicteric Ext:    No edema Neuro: alert and oriented x 3 Psych: normal mood and affect  Data Reviewed:  Reviewed labs, radiology imaging, old records and pertinent past GI work up     Assessment and Plan Assessment & Plan Gastroesophageal reflux disease (GERD) Chronic GERD with recent exacerbation. Symptoms include acid reflux and potential reactive airway symptoms. Current management with Protonix  once daily is insufficient. Discussed potential for antireflux surgery as a long-term solution if medication management is inadequate. Surgery would involve tightening the valve area to prevent reflux, but requires confirmation of good swallowing function via esophageal manometry before proceeding. - Increase Protonix  to twice daily before breakfast and dinner for 6-8 weeks. - Advise taking famotidine  20 mg after dinner if consuming a late or heavy meal. - Schedule upper endoscopy on August 14th at 7 AM to assess erosive esophagitis, esophageal stricture and need for further intervention. - Educate on  lifestyle modifications: avoid caffeine, chocolate, and alcohol in the evening; elevate head of bed; avoid late meals and lying flat after eating. - Consider esophageal manometry if surgical intervention is pursued to ensure swallowing function is adequate.  Cough due to GERD Cough likely secondary to acid reflux, contributing to a cycle of irritation and exacerbation of GERD symptoms. Addressing GERD is expected to alleviate cough. Persistent cough may indicate the need for further evaluation. - Manage GERD aggressively with increased Protonix  dosage and lifestyle modifications. - Consider referral to a pulmonologist if cough persists after GERD is controlled.  Difficulty swallowing due to GERD Intermittent dysphagia, likely related to esophageal irritation from GERD. Previous esophageal dilation has been performed. Further assessment needed to determine if additional intervention is required. - Perform upper endoscopy on August 14th to evaluate esophagitis, esophageal stricture and assess need for further dilation.    The patient was provided an opportunity to ask questions and all were answered. The patient agreed with the plan and demonstrated an understanding of the instructions.  Lorena Rolling , MD    CC: Sun, Vyvyan, MD

## 2023-09-02 ENCOUNTER — Other Ambulatory Visit: Payer: Self-pay | Admitting: Gastroenterology

## 2023-09-09 ENCOUNTER — Other Ambulatory Visit: Payer: Self-pay

## 2023-09-09 ENCOUNTER — Other Ambulatory Visit (HOSPITAL_COMMUNITY): Payer: Self-pay

## 2023-09-23 ENCOUNTER — Encounter: Payer: Self-pay | Admitting: Gastroenterology

## 2023-09-29 ENCOUNTER — Telehealth: Payer: Self-pay | Admitting: Gastroenterology

## 2023-09-29 NOTE — Telephone Encounter (Signed)
 Inbound call from patient stating she took Zyrtec allergy medicine. Requesting to know if this was okay to take prior to 8/14 endoscopy. Please advise, thank you

## 2023-09-29 NOTE — Telephone Encounter (Signed)
 Spoke with the patient. She took Zyrtec tablet for her allergies. Assured the patient she is still okay for her planned procedure. Thanks me for the return call.

## 2023-10-01 ENCOUNTER — Encounter: Payer: Self-pay | Admitting: Gastroenterology

## 2023-10-01 ENCOUNTER — Ambulatory Visit (AMBULATORY_SURGERY_CENTER): Admitting: Gastroenterology

## 2023-10-01 VITALS — BP 98/64 | HR 54 | Temp 97.6°F | Resp 14 | Ht 68.0 in | Wt 207.0 lb

## 2023-10-01 DIAGNOSIS — K2100 Gastro-esophageal reflux disease with esophagitis, without bleeding: Secondary | ICD-10-CM | POA: Diagnosis not present

## 2023-10-01 DIAGNOSIS — K222 Esophageal obstruction: Secondary | ICD-10-CM | POA: Diagnosis not present

## 2023-10-01 DIAGNOSIS — K449 Diaphragmatic hernia without obstruction or gangrene: Secondary | ICD-10-CM | POA: Diagnosis not present

## 2023-10-01 DIAGNOSIS — R1319 Other dysphagia: Secondary | ICD-10-CM

## 2023-10-01 MED ORDER — SODIUM CHLORIDE 0.9 % IV SOLN: 500.0000 mL | INTRAVENOUS | Status: DC

## 2023-10-01 NOTE — Progress Notes (Signed)
 Called to room to assist during endoscopic procedure.  Patient ID and intended procedure confirmed with present staff. Received instructions for my participation in the procedure from the performing physician.

## 2023-10-01 NOTE — Progress Notes (Signed)
 [facility] History and Physical   Primary Care Physician:  Sun, Vyvyan, MD   Reason for Procedure:  Dysphagia, GERD  Plan:    EGD  with possible interventions as needed     HPI: Pamela Little is a very pleasant 53 y.o. female here for EGD for management of dysphagia.   The risks and benefits as well as alternatives of endoscopic procedure(s) have been discussed and reviewed. All questions answered. The patient agrees to proceed.    Past Medical History:  Diagnosis Date   Asthma    with activity   Depression    Migraine 03/21/2014   Panic attacks    Restless legs syndrome 03/21/2014   Tremor, essential 03/21/2014   Vertigo     Past Surgical History:  Procedure Laterality Date   ABLATION ON ENDOMETRIOSIS     BALLOON DILATION N/A 06/11/2023   Procedure: BALLOON DILATION;  Surgeon: Shila Gustav GAILS, MD;  Location: [facility] ENDOSCOPY;  Service: Gastroenterology;  Laterality: N/A;   BIOPSY OF SKIN SUBCUTANEOUS TISSUE AND/OR MUCOUS MEMBRANE  06/11/2023   Procedure: BIOPSY, SKIN, SUBCUTANEOUS TISSUE, OR MUCOUS MEMBRANE;  Surgeon: Shila Gustav GAILS, MD;  Location: [facility] ENDOSCOPY;  Service: Gastroenterology;;   CESAREAN SECTION WITH BILATERAL TUBAL LIGATION     ESOPHAGOGASTRODUODENOSCOPY N/A 06/11/2023   Procedure: EGD (ESOPHAGOGASTRODUODENOSCOPY);  Surgeon: Tarren Velardi V, MD;  Location: [facility] ENDOSCOPY;  Service: Gastroenterology;  Laterality: N/A;   MYRINGOTOMY WITH TUBE PLACEMENT     TONSILLECTOMY      Prior to Admission medications   Medication Sig Start Date End Date Taking? Authorizing Provider  buPROPion  (WELLBUTRIN  XL) 150 MG 24 hr tablet Take 1 tablet (150 mg total) by mouth daily. 05/20/23  Yes Cottle, Lorene KANDICE Raddle., MD  famotidine  (PEPCID ) 20 MG tablet TAKE 1 TABLET BY MOUTH EVERYDAY AT BEDTIME 09/02/23  Yes Falynn Ailey V, MD  pantoprazole  (PROTONIX ) 40 MG tablet Take 1 tablet (40 mg total) by mouth 2 (two) times daily before a meal. 06/11/23 06/10/24 Yes  Nyan Dufresne V, MD  rOPINIRole  (REQUIP  XL) 4 MG 24 hr tablet Take 1 tablet (4 mg total) by mouth at bedtime. 05/20/23  Yes Cottle, Lorene KANDICE Raddle., MD  sertraline  (ZOLOFT ) 100 MG tablet Take 1.5 tablets (150 mg total) by mouth daily. 05/20/23  Yes Cottle, Lorene KANDICE Raddle., MD  traZODone  (DESYREL ) 50 MG tablet Take 1-2 tablets (50-100 mg total) by mouth at bedtime. 05/20/23  Yes Cottle, Lorene KANDICE Raddle., MD  VYVANSE  60 MG capsule Take 1 capsule (60 mg total) by mouth every morning. (08/31/23) 08/31/23  Yes Cottle, Lorene KANDICE Raddle., MD  albuterol (VENTOLIN HFA) 108 (90 Base) MCG/ACT inhaler  09/30/18   [provider]  ipratropium (ATROVENT) 0.06 % nasal spray Place 2 sprays into both nostrils 3 (three) times daily as needed for rhinitis. 04/13/23   [provider]  metroNIDAZOLE (METROCREAM) 0.75 % cream Apply 1 Application topically at bedtime. 04/28/23   [provider]  montelukast (SINGULAIR) 10 MG tablet Take 10 mg by mouth daily.    [provider]    Current Outpatient Medications  Medication Sig Dispense Refill   buPROPion  (WELLBUTRIN  XL) 150 MG 24 hr tablet Take 1 tablet (150 mg total) by mouth daily. 90 tablet 3   famotidine  (PEPCID ) 20 MG tablet TAKE 1 TABLET BY MOUTH EVERYDAY AT BEDTIME 90 tablet 1   pantoprazole  (PROTONIX ) 40 MG tablet Take 1 tablet (40 mg total) by mouth 2 (two) times daily before a meal. 60  tablet 6   rOPINIRole  (REQUIP  XL) 4 MG 24 hr tablet Take 1 tablet (4 mg total) by mouth at bedtime. 90 tablet 2   sertraline  (ZOLOFT ) 100 MG tablet Take 1.5 tablets (150 mg total) by mouth daily. 135 tablet 3   traZODone  (DESYREL ) 50 MG tablet Take 1-2 tablets (50-100 mg total) by mouth at bedtime. 180 tablet 3   VYVANSE  60 MG capsule Take 1 capsule (60 mg total) by mouth every morning. (08/31/23) 30 capsule 0   albuterol (VENTOLIN HFA) 108 (90 Base) MCG/ACT inhaler      ipratropium (ATROVENT) 0.06 % nasal spray Place 2 sprays into both nostrils 3 (three) times daily  as needed for rhinitis.     metroNIDAZOLE (METROCREAM) 0.75 % cream Apply 1 Application topically at bedtime.     montelukast (SINGULAIR) 10 MG tablet Take 10 mg by mouth daily.     Current Facility-Administered Medications  Medication Dose Route Frequency Provider Last Rate Last Admin   0.9 %  sodium chloride  infusion  500 mL Intravenous Continuous Kyros Salzwedel V, MD        Allergies as of 10/01/2023   (No Known Allergies)    Family History  Problem Relation Age of Onset   Hypertension Mother    Hypertension Father    Tremor Sister    Heart attack Neg Hx    Diabetes Neg Hx    Colon cancer Neg Hx    Colon polyps Neg Hx    Stomach cancer Neg Hx    Esophageal cancer Neg Hx     Social History   Socioeconomic History   Marital status: Married    Spouse name: Not on file   Number of children: 3   Years of education: college   Highest education level: Not on file  Occupational History   Not on file  Tobacco Use   Smoking status: Never   Smokeless tobacco: Never  Vaping Use   Vaping status: Never Used  Substance and Sexual Activity   Alcohol use: Yes    Alcohol/week: 1.0 standard drink of alcohol    Types: 1 Cans of beer per week    Comment: occasionaly   Drug use: Never   Sexual activity: Not on file  Other Topics Concern   Not on file  Social History Narrative   Patient is right handed.   Patient drinks 1 cup caffeine daily.   Social Drivers of Corporate investment banker Strain: Not on file  Food Insecurity: Not on file  Transportation Needs: Not on file  Physical Activity: Not on file  Stress: Not on file  Social Connections: Not on file  Intimate Partner Violence: Unknown (05/23/2021)   Received from [facility]   HITS    Physically Hurt: Not on file    Insult or Talk Down To: Not on file    Threaten Physical Harm: Not on file    Scream or Curse: Not on file    Review of Systems:  All other review of systems negative except as mentioned in  the HPI.  Physical Exam: Vital signs in last 24 hours: BP 117/77   Pulse (!) 58   Temp 97.6 F (36.4 C)   Resp 18   Ht 5' 8 (1.727 m)   Wt 207 lb (93.9 kg)   SpO2 98%   BMI 31.47 kg/m  General:   Alert, NAD Lungs:  Clear .   Heart:  Regular rate and rhythm Abdomen:  Soft, nontender and  nondistended. Neuro/Psych:  Alert and cooperative. Normal mood and affect. A and O x 3  Reviewed labs, radiology imaging, old records and pertinent past GI work up  Patient is appropriate for planned procedure(s) and anesthesia in an ambulatory setting   K. Veena Lilly Gasser , MD [phone]

## 2023-10-01 NOTE — Op Note (Signed)
 [facility] Patient Name: Pamela Little Procedure Date: 10/01/2023 7:58 AM MRN: [id number] Endoscopist: Gustav ALONSO Mcgee , MD, [id number] Age: 53 Referring MD:  Date of Birth: October 09, 1970 Gender: Female Account #: [id number] Procedure:                Upper GI endoscopy Indications:              Dysphagia, Follow-up of reflux esophagitis Medicines:                Monitored Anesthesia Care Procedure:                Pre-Anesthesia Assessment:                           - Prior to the procedure, a History and Physical                            was performed, and patient medications and                            allergies were reviewed. The patient's tolerance of                            previous anesthesia was also reviewed. The risks                            and benefits of the procedure and the sedation                            options and risks were discussed with the patient.                            All questions were answered, and informed consent                            was obtained. Prior Anticoagulants: The patient has                            taken no anticoagulant or antiplatelet agents. ASA                            Grade Assessment: II - A patient with mild systemic                            disease. After reviewing the risks and benefits,                            the patient was deemed in satisfactory condition to                            undergo the procedure.                           After obtaining informed consent, the endoscope was  passed under direct vision. Throughout the                            procedure, the patient's blood pressure, pulse, and                            oxygen saturations were monitored continuously. The                            Olympus Scope [id number] was introduced through the                            mouth, and advanced to the second part of duodenum.                            The  upper GI endoscopy was accomplished without                            difficulty. The patient tolerated the procedure                            well. Scope In: Scope Out: Findings:                 One benign-appearing, intrinsic moderate stenosis                            was found 35 to 36 cm from the incisors. This                            stenosis measured 1.7 cm (inner diameter) x 1 cm                            (in length). The stenosis was traversed. A TTS                            dilator was passed through the scope. Dilation with                            an 18-19-20 mm x 8 cm CRE balloon dilator was                            performed to 20 mm. The dilation site was examined                            following endoscope reinsertion and showed mild                            mucosal disruption and moderate improvement in                            luminal narrowing.  A 3 cm hiatal hernia was present.                           The stomach was normal.                           The cardia and gastric fundus were normal on                            retroflexion.                           The examined duodenum was normal. Complications:            No immediate complications. Estimated Blood Loss:     Estimated blood loss was minimal. Impression:               - Benign-appearing esophageal stenosis. Dilated.                           - 3 cm hiatal hernia.                           - Normal stomach.                           - Normal examined duodenum.                           - No specimens collected. Recommendation:           - Resume previous diet.                           - Continue present medications.                           - Return to GI office in 6 months.                           - Follow an antireflux regimen.                           - Continue Pantaprazole 40mg  BID [name]. Gayathri Futrell, MD 10/01/2023 8:17:58 AM This report has  been signed electronically.

## 2023-10-01 NOTE — Progress Notes (Signed)
 Sedate, gd SR, tolerated procedure well, VSS, report to RN

## 2023-10-01 NOTE — Patient Instructions (Signed)
 Dilation diet:  Clear liquids for 1 hr, soft diet for 24 hr, then resume previous diet.  Continue present medications.  Return to GI office in 6 months. Follow an antireflux regimen. Continue Pantoprazole  40 mg twice daily. Handouts provided on hiatal hernia and stricture.   YOU HAD AN ENDOSCOPIC PROCEDURE TODAY AT THE [facility]:   Refer to the procedure report that was given to you for any specific questions about what was found during the examination.  If the procedure report does not answer your questions, please call your gastroenterologist to clarify.  If you requested that your care partner not be given the details of your procedure findings, then the procedure report has been included in a sealed envelope for you to review at your convenience later.  YOU SHOULD EXPECT: Some feelings of bloating in the abdomen. Passage of more gas than usual.  Walking can help get rid of the air that was put into your GI tract during the procedure and reduce the bloating. If you had a lower endoscopy (such as a colonoscopy or flexible sigmoidoscopy) you may notice spotting of blood in your stool or on the toilet paper. If you underwent a bowel prep for your procedure, you may not have a normal bowel movement for a few days.  Please Note:  You might notice some irritation and congestion in your nose or some drainage.  This is from the oxygen used during your procedure.  There is no need for concern and it should clear up in a day or so.  SYMPTOMS TO REPORT IMMEDIATELY:  Following upper endoscopy (EGD)  Vomiting of blood or coffee ground material  New chest pain or pain under the shoulder blades  Painful or persistently difficult swallowing  New shortness of breath  Fever of 100F or higher  Black, tarry-looking stools  For urgent or emergent issues, a gastroenterologist can be reached at any hour by calling [phone]. Do not use MyChart messaging for urgent concerns.    DIET:  We  do recommend a small meal at first, but then you may proceed to your regular diet.  Drink plenty of fluids but you should avoid alcoholic beverages for 24 hours.  ACTIVITY:  You should plan to take it easy for the rest of today and you should NOT DRIVE or use heavy machinery until tomorrow (because of the sedation medicines used during the test).    FOLLOW UP: Our staff will call the number listed on your records the next business day following your procedure.  We will call around 7:15- 8:00 am to check on you and address any questions or concerns that you may have regarding the information given to you following your procedure. If we do not reach you, we will leave a message.     If any biopsies were taken you will be contacted by phone or by letter within the next 1-3 weeks.  Please call us  at [phone] if you have not heard about the biopsies in 3 weeks.    SIGNATURES/CONFIDENTIALITY: You and/or your care partner have signed paperwork which will be entered into your electronic medical record.  These signatures attest to the fact that that the information above on your After Visit Summary has been reviewed and is understood.  Full responsibility of the confidentiality of this discharge information lies with you and/or your care-partner.

## 2023-10-01 NOTE — Progress Notes (Signed)
 Received dilation diet per Md verbal order. Pt and husband with verbal understanding. Pt provided written discharge instructions.

## 2023-10-02 ENCOUNTER — Telehealth: Payer: Self-pay

## 2023-10-02 NOTE — Telephone Encounter (Signed)
 Attempted to reach patient for follow up phone call. No answer, left voicemail to contact Dr. Allean Aran office with any questions or concerns.

## 2023-10-13 ENCOUNTER — Other Ambulatory Visit: Payer: Self-pay | Admitting: Psychiatry

## 2023-10-13 ENCOUNTER — Other Ambulatory Visit (HOSPITAL_COMMUNITY): Payer: Self-pay

## 2023-10-13 DIAGNOSIS — F902 Attention-deficit hyperactivity disorder, combined type: Secondary | ICD-10-CM

## 2023-10-13 MED ORDER — VYVANSE 60 MG PO CAPS
60.0000 mg | ORAL_CAPSULE | ORAL | 0 refills | Status: DC
  Filled 2023-10-13: qty 30, 30d supply, fill #0

## 2023-11-01 ENCOUNTER — Other Ambulatory Visit: Payer: Self-pay | Admitting: Psychiatry

## 2023-11-01 DIAGNOSIS — F902 Attention-deficit hyperactivity disorder, combined type: Secondary | ICD-10-CM

## 2023-11-02 ENCOUNTER — Other Ambulatory Visit (HOSPITAL_COMMUNITY): Payer: Self-pay

## 2023-11-12 ENCOUNTER — Other Ambulatory Visit: Payer: Self-pay | Admitting: Psychiatry

## 2023-11-12 ENCOUNTER — Other Ambulatory Visit (HOSPITAL_COMMUNITY): Payer: Self-pay

## 2023-11-12 DIAGNOSIS — F902 Attention-deficit hyperactivity disorder, combined type: Secondary | ICD-10-CM

## 2023-11-12 MED ORDER — VYVANSE 60 MG PO CAPS
60.0000 mg | ORAL_CAPSULE | ORAL | 0 refills | Status: DC
  Filled 2023-11-12 – 2023-12-03 (×2): qty 30, 30d supply, fill #0

## 2023-11-19 ENCOUNTER — Ambulatory Visit: Admitting: Psychiatry

## 2023-11-23 ENCOUNTER — Other Ambulatory Visit (HOSPITAL_COMMUNITY): Payer: Self-pay

## 2023-12-03 ENCOUNTER — Other Ambulatory Visit: Payer: Self-pay

## 2023-12-03 ENCOUNTER — Other Ambulatory Visit (HOSPITAL_COMMUNITY): Payer: Self-pay

## 2023-12-09 ENCOUNTER — Ambulatory Visit (INDEPENDENT_AMBULATORY_CARE_PROVIDER_SITE_OTHER): Admitting: Psychiatry

## 2023-12-09 ENCOUNTER — Encounter: Payer: Self-pay | Admitting: Psychiatry

## 2023-12-09 DIAGNOSIS — F902 Attention-deficit hyperactivity disorder, combined type: Secondary | ICD-10-CM | POA: Diagnosis not present

## 2023-12-09 DIAGNOSIS — F411 Generalized anxiety disorder: Secondary | ICD-10-CM | POA: Diagnosis not present

## 2023-12-09 DIAGNOSIS — G2581 Restless legs syndrome: Secondary | ICD-10-CM

## 2023-12-09 DIAGNOSIS — G250 Essential tremor: Secondary | ICD-10-CM

## 2023-12-09 DIAGNOSIS — F4001 Agoraphobia with panic disorder: Secondary | ICD-10-CM

## 2023-12-09 DIAGNOSIS — F5105 Insomnia due to other mental disorder: Secondary | ICD-10-CM

## 2023-12-09 DIAGNOSIS — F50811 Binge eating disorder, moderate: Secondary | ICD-10-CM

## 2023-12-09 DIAGNOSIS — G4733 Obstructive sleep apnea (adult) (pediatric): Secondary | ICD-10-CM

## 2023-12-09 MED ORDER — BUPROPION HCL ER (XL) 150 MG PO TB24: 150.0000 mg | ORAL_TABLET | Freq: Every day | ORAL | 3 refills | Status: AC

## 2023-12-09 MED ORDER — SERTRALINE HCL 100 MG PO TABS: 150.0000 mg | ORAL_TABLET | Freq: Every day | ORAL | 3 refills | Status: AC

## 2023-12-09 MED ORDER — TRAZODONE HCL 50 MG PO TABS: 50.0000 mg | ORAL_TABLET | Freq: Every day | ORAL | 3 refills | Status: AC

## 2023-12-09 MED ORDER — LISDEXAMFETAMINE DIMESYLATE 60 MG PO CAPS: 60.0000 mg | ORAL_CAPSULE | ORAL | 0 refills | Status: AC

## 2023-12-09 MED ORDER — ROPINIROLE HCL ER 4 MG PO TB24: 4.0000 mg | ORAL_TABLET | Freq: Every day | ORAL | 2 refills | Status: AC

## 2023-12-09 NOTE — Progress Notes (Signed)
 Pamela Little [id number] 1970/08/05 53 y.o.    Subjective:   Patient ID:  Pamela Little is a 53 y.o. (DOB Mar 06, 1970) female.  Chief Complaint:  Chief Complaint  Patient presents with   Follow-up   Depression   Anxiety   Sleeping Problem   ADD    Pamela Little presents to the office today for follow-up ofweight concerns and pharmacy problems and FU anxiety.  When seen May 2020.  She was having insomnia as we increased the Xanax  to 0.5 mg twice daily and 1 mg nightly.  She continued Wellbutrin  XL 300 mg every morning, sertraline  150, ropinirole  for restless legs, propranolol  as needed for anxiety, naltrexone  for weight gain. Also taking pramipexole  ER.     seen September.  Following change: DT insomnia increase Xanax  0.5 mg BID and 1mg  HS.  seen February 16, 2019.  The following was noted: Hanging in there.  Quit taking Naltrexone  and pramipexole  ER 3mg  .  After tennis would feel unusually fatigued and like she might pass out. More RLS in the afternoon without the pramipexole   The following medical decisions were made: Re: RLS is still on unusual med combination for the following reason: Pramipexole  only interfered with sleep.  So stopped. Ropinirole  only was too fatiguing. So taking the mixture of the 2 for severe RLS Failed iron trials for RLS Trial gabapentin  300 mg 2 twice daily for RLS.  04/2019 appt noted: She does not feel the gabapentin  does much so still taking the ropinirole . No SE.   Milder RLS.   Since the higher dosage of Wellbutrin  did not help with weight will leave at 300mg  daily.  May retry higherdose once son is placed.  Consider phentermine. Has been told she's in menopause and is having sweating and hot flashes.  Sleep comes and goes and is not consistent.  Overall mentally OK for the most part.  Definitely anxious.  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  Not depressed.   Can be chronically tired.  Always upset with her weight.  Working normal 40  + hours.  Chronically stressed. Has been a little anxious and been stressed with H sick with leukemia.  Son 15 yo still driving them nuts and not doing school.  Still struggling big time with him.  That will make her weepy.     Definitely anxious.  Struggles with weight.  Plans to see GYN  Trouble staying asleep and 5 hours average, not enough.  Some awakening with anxiety and worrying over son being out and whether he's safe.  Typical Xanax  0.5mg  TID Took Wellb 450 am for awhile and did not lose weight or see any benefit or SE.  Emotional eater and stressed by son's mental health and substance abuse problems and refusing school.  Very angry and defiant.  Anticipates placement somewhere.  Drinking and pot.  Chronically anxious.  Hard on the whole family.  Refusing church.  Pt doesn't think changing her med will help.  I keep eating.  Not ususal for her.  Not dep but upset with her son.  No severe panic attacks.  Best very frustrated with her binge eating at this time but feels the stress at home is precipitating it.  Switched Ativan to Xanax  and been OK with that.  Not sure re: differences except sleeps better ususally.    Tremor manageable. Plan:   Change to 1 mg Xanax  DT QL.   Re: RLS is still on unusual med combination for  the following reason: Pramipexole  only interfered with sleep. So stopped. Ropinirole  only was too fatiguing.  Failed iron trials for RLS Trial higher gabapentin  600 mg 2 twice daily for RLS.  Might also help anxiety. Caffeine 1 diet Coke or less.    11/24/19 appt noted: A lot of stress.  2 panic attacks.  Never been so busy and stressed as now.  Menopause.  Mind racing at night and not resting well. Had stopped naltrexone  and ER pramipexole  bc felt it was sedating. RLS is still a problem and reduced ropinirole  and pramipexole  to deal with sedative SE. Wishes she slept better.  CO EMA usually not due to RLS.  Feels RLS more in hands. Sleep 4-5 hours average. Tries to do too  much and trouble finishing things.  Son has ADD and wonders if that's part of her problem. Plan: no med changes  06/05/2020 appointment with the following noted: Doing OK. Covid December 2021.  Can still struggle with RLS in legs and hands. Not taking propranolol  much. Asks to increase pramipexole  for RLS to  0.375 mg in addition to Ropinirole  2 mg PM. Limited to 1 Coke Zero as late as late afternoon. Wonders about ADD. Not binge eating. History palpitations with cardiology appt next week. Some tremor and wonders about reducing Wellbutrin  bc no problems with depression lately. Considering ADD tx.  Defer. Anxiety manageable. Plan: Re: RLS is still on unusual med combination for the following reason: Pramipexole  only interfered with sleep. Ropinirole  only was too fatiguing. OK to use combo if necessary as previously required. Ropinirole  2 mg PM with pramipexole  0.375 mg PM Stop afternoon caffeine. Disc insomnia trazodone  vs Sonata.   Reduce Wellbutrin  to see if tremor is better.  12/12/20 appt noted: Caffeine contributes to tremor.  Not sure problem with less Wellbutrin .  Always mild depression. Still active. Above dosing and still had terrrible RLS and trouble sleeping.  With Ropinirole  alone it helped some.  Spreading out ropinirole  helped.  Pramipexole  didn't help. No SE except some fatigue.  Split ropinirole  helped that and trazodone  helped. Rare anxiety in middle of sports activity.  Cleared by cardiology.  Anxiety managed. Still wonders about ADD bc trouble finishing things.  So many things she doesn't finish.  Frustrated.  H notices.  Wants to try ADD tx. Feels needs AM Xanax  bc gets demands of day stressed. Plan: Wean AM Xanax  and start concerta  for ADD 36 mg AM  02/19/2021 appt noted: Not much benefit noted with Concerta  after the first couple of days.   Took it off and on over the holidays. Only Xanax  at night. MorE  RLS lately and taking 2 ropinirole  1 mg  usually but  increased to 3 in PM. Asks about increasin gConcerta.  No SE Palpitations not worse.   Dx mild - Moderate OSA ordered CPAP but back log. Plan: Increase Concerta  54 mg daily Continue ropinirole  2 to 3 mg in the evening as needed for restless legs Continue sertraline  150 mg daily Continue trazodone  150 to 100 mg nightly as needed Continue Xanax  0.5 mg 3 times daily as needed anxiety or insomnia  05/15/21 appt noted: She thinks it was ok to increase Concerta  54 mg AM.  After a short time doesn't notice much difference.  Initially noticed a change but didn't thereafter.  Skips weekends.  No sig withdrawal on the weekends.  Constantly moving.   Not real impressed with benefit Concerta .  In the beginning was more productive and efficient.   Sleep pretty  good.  Ropinirole  and trazodone  help with 6-7 hours of sleep.  Less awakening.   No SE.  No sig Xanax  during the day.  For the most part anxiety is manageable.  Stress H lost his job and a lot on her plate but is OK with anxiety usually.  No sig panic. Plan: Switch Concerta  54 to Vyvanse  50 mg AM  07/30/2021 appointment the following noted: Busy time at work.  Works for [employer]. Concerta  54 was highest dose and limited response. Can't tell a big difference with Vyvanse  and Concerta . A little anxious lately but has some stress. Overwhelmed at times.  Near panic a couple of times. Not depressed. Hates RLS.  Hard to manage it at times. Sleep 4-7 hours.  Using CPAP trying masks starting in Feb and use off and on. Plan: Wellbutrin  XL 150 mg AM LED bc stimulants. CONTINUE  Sertraline  100 per her request. Increase Vyvanse  to 60 mg AM Continue ropinirole  3 mg PM and trazodone  50-100 mg HS  12/16/2021 appointment noted: Problems with insurance DT H losing his job and change to her insurance.  Insurance made mistakes.   Disc shortage of vyvanse .  Saw more benefit with increase Vyvanse  60 mg with initial jitteriness.  Helps her productivity.    Not  jitery most days. This time of year is busiest with work and activities.   Sleep is OK but worst RLS lately.  Not much caffeine but some Coke Zero.   05/22/22 appt: Change generic ropinirole  and got dizzy and wiped out.  But this mainly seemed to be only after playing tennis. Hard time getting Vyvanse .  Disc this at length.  Has been without it for weeks at a time. On Vyvanse  60 am better mood focus and eating behavior Was in poor shape without it.  Couldn't get started and complete things. Residual depression and anxiety. Gritting teeth.  Plan: Needing increase Ropinirole  3 mg - 4 mg PM   10/28/22 appt noted:  Psych meds: Wellbutrin  XL 150 every morning, Vyvanse  60 every morning, ropinirole  1 mg tablets 4 in the evening, sertraline  150 daily, trazodone  50 to 100 mg nightly. Usually able to get Vyvanse  60 mg AM. Having trouble with RLS again.  Limits caffeine.  RLS often worse after playing tennis.  Tennis varies.    Trazodone  helps sleep.   SE ropinirole  if takes more than 2 mg at once.  Brand Vyvanse  was clearer.  Mood and anxiety are OK Plan: Needing increase Ropinirole  3 mg - 4 mg PM or ER or trial Lyrica as alternative Switch ropinirole  to ER 2 mg at evening meal with 1 mg IR.  05/20/23 appt noted: Psych meds: Wellbutrin  XL 150 every morning, Vyvanse  60 every morning, sertraline  150 daily, trazodone  50 to 100 mg nightly. ropinirole  ER 4 mg tablets  Working all the time.  Short staffed.  Need to say no to more things.   RLS can get bad but not too often.  Sometimes waits too long to take ropinirole .  Otherwise satisfactory response.  Sleep variable good the last couple of  nights.   Wants to try brand Vyvanse  again for better response if affordable. Mood fine.  Anxious with all the work but pretty normal for high volume work.   12/09/23 appt noted: Psych meds: Wellbutrin  XL 150 every morning, Brand Vyvanse  60 every morning, sertraline  100 daily, trazodone  50 to 100 mg nightly.  ropinirole  ER 4 mg tablets  Not 100% sure difference between brand and generic Vyvanse . D  pregnant and trying to change some of her priorities about life.  This should be a good thing re: work /  life balance.  Always worked hard but will get some help.   RLS bad. Has to take ropinirole  earlier.  Even RLS can effect hands.   Mood and anxiety better.     Stress H with Leukemia and recent hospitalization for heart problems.  Past Psychiatric Medication Trials:  Fluoxetine, Wellbutrin  450 no wt loss,  venlafaxine, sertraline  300, Lexapro 30, fluvoxamine, buspirone,  clonazepam, lorazepam, Xanax  0.5, trazodone  50-100mg  HS Abilify,   Concerta  54 limited effect Vyvanse  60  Vistaril, propranolol , Mirapex , naltrexone  nausea  topiramate  200 fatigue, Pramipexole  only interfered with sleep NR.   Pramipexole  ER Ropinirole  only was too fatiguing.   So hx taking the mixture of the 2 for severe RLS Failed iron trials for RLS Gabapentin  600 ? SE Naltrexone  excessive weakness.  Review of Systems:  Review of Systems  HENT:  Negative for congestion.   Cardiovascular:  Positive for palpitations. Negative for chest pain.  Neurological:  Negative for weakness.  Psychiatric/Behavioral:  Positive for decreased concentration and sleep disturbance. Negative for agitation, behavioral problems, confusion, dysphoric mood, hallucinations and self-injury. The patient is not nervous/anxious and is not hyperactive.     Medications: I have reviewed the patient's current medications.  Current Outpatient Medications  Medication Sig Dispense Refill   albuterol (VENTOLIN HFA) 108 (90 Base) MCG/ACT inhaler      famotidine  (PEPCID ) 20 MG tablet TAKE 1 TABLET BY MOUTH EVERYDAY AT BEDTIME 90 tablet 1   ipratropium (ATROVENT) 0.06 % nasal spray Place 2 sprays into both nostrils 3 (three) times daily as needed for rhinitis.     [START ON 01/06/2024] lisdexamfetamine (VYVANSE ) 60 MG capsule Take 1 capsule (60 mg total)  by mouth every morning. 30 capsule 0   [START ON 02/03/2024] lisdexamfetamine (VYVANSE ) 60 MG capsule Take 1 capsule (60 mg total) by mouth every morning. 30 capsule 0   metroNIDAZOLE (METROCREAM) 0.75 % cream Apply 1 Application topically at bedtime.     montelukast (SINGULAIR) 10 MG tablet Take 10 mg by mouth daily.     pantoprazole  (PROTONIX ) 40 MG tablet Take 1 tablet (40 mg total) by mouth 2 (two) times daily before a meal. 60 tablet 6   buPROPion  (WELLBUTRIN  XL) 150 MG 24 hr tablet Take 1 tablet (150 mg total) by mouth daily. 90 tablet 3   lisdexamfetamine (VYVANSE ) 60 MG capsule Take 1 capsule (60 mg total) by mouth every morning. 30 capsule 0   rOPINIRole  (REQUIP  XL) 4 MG 24 hr tablet Take 1 tablet (4 mg total) by mouth at bedtime. 90 tablet 2   sertraline  (ZOLOFT ) 100 MG tablet Take 1.5 tablets (150 mg total) by mouth daily. 135 tablet 3   traZODone  (DESYREL ) 50 MG tablet Take 1-2 tablets (50-100 mg total) by mouth at bedtime. 180 tablet 3   No current facility-administered medications for this visit.    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   Vertigo     Family History  Problem Relation Age of Onset   Hypertension Mother    Hypertension Father    Tremor Sister    Heart attack Neg Hx    Diabetes Neg Hx  Colon cancer Neg Hx    Colon polyps Neg Hx    Stomach cancer Neg Hx    Esophageal cancer Neg Hx     Social History   Socioeconomic History   Marital status: Married    Spouse name: Not on file   Number of children: 3   Years of education: college   Highest education level: Not on file  Occupational History   Not on file  Tobacco Use   Smoking status: Never   Smokeless tobacco: Never  Vaping Use   Vaping status: Never Used  Substance and Sexual  Activity   Alcohol use: Yes    Alcohol/week: 1.0 standard drink of alcohol    Types: 1 Cans of beer per week    Comment: occasionaly   Drug use: Never   Sexual activity: Not on file  Other Topics Concern   Not on file  Social History Narrative   Patient is right handed.   Patient drinks 1 cup caffeine daily.   Social Drivers of Corporate investment banker Strain: Not on file  Food Insecurity: Not on file  Transportation Needs: Not on file  Physical Activity: Not on file  Stress: Not on file  Social Connections: Not on file  Intimate Partner Violence: Unknown (05/23/2021)   Received from [facility]   HITS    Physically Hurt: Not on file    Insult or Talk Down To: Not on file    Threaten Physical Harm: Not on file    Scream or Curse: Not on file    Past Medical History, Surgical history, Social history, and Family history were reviewed and updated as appropriate.   Please see review of systems for further details on the patient's review from today.   Objective:   Physical Exam:  There were no vitals taken for this visit.  Physical Exam Constitutional:      General: She is not in acute distress. Musculoskeletal:        General: No deformity.  Neurological:     Mental Status: She is alert and oriented to person, place, and time.     Cranial Nerves: No dysarthria.     Coordination: Coordination normal.  Psychiatric:        Attention and Perception: Perception normal. She is inattentive. She does not perceive auditory or visual hallucinations.        Mood and Affect: Mood is anxious. Mood is not depressed. Affect is not labile, blunt or inappropriate.        Speech: Speech normal.        Behavior: Behavior normal. Behavior is cooperative.        Thought Content: Thought content normal. Thought content is not paranoid or delusional. Thought content does not include homicidal or suicidal ideation. Thought content does not include suicidal plan.        Cognition and  Memory: Cognition and memory normal.        Judgment: Judgment normal.     Comments: Insight pretty good. She is chronically anxious it is never been completely controlled but is better Residual depression not severe     Lab Review:     Component Value Date/Time   NA 140 06/10/2023 2119   K 4.0 06/10/2023 2119   CL 103 06/10/2023 2119   CO2 24 06/10/2023 2119   GLUCOSE 93 06/10/2023 2119   BUN 15 06/10/2023 2119   CREATININE 0.99 06/10/2023 2119   CALCIUM 10.4 (H) 06/10/2023 2119   PROT  7.7 06/10/2023 2119   ALBUMIN 5.0 06/10/2023 2119   AST 34 06/10/2023 2119   ALT 25 06/10/2023 2119   ALKPHOS 81 06/10/2023 2119   BILITOT 0.8 06/10/2023 2119   GFRNONAA >60 06/10/2023 2119   GFRAA >60 06/18/2018 1901       Component Value Date/Time   WBC 6.5 06/10/2023 2119   RBC 4.66 06/10/2023 2119   HGB 14.7 06/10/2023 2119   HCT 42.4 06/10/2023 2119   PLT 240 06/10/2023 2119   MCV 91.0 06/10/2023 2119   MCH 31.5 06/10/2023 2119   MCHC 34.7 06/10/2023 2119   RDW 12.9 06/10/2023 2119   LYMPHSABS 1.2 06/10/2023 2119   MONOABS 0.6 06/10/2023 2119   EOSABS 0.2 06/10/2023 2119   BASOSABS 0.0 06/10/2023 2119   Ferritin 2016 was 117  Assessment: Plan:    Generalized anxiety disorder - Plan: sertraline  (ZOLOFT ) 100 MG tablet  Panic disorder with agoraphobia - Plan: sertraline  (ZOLOFT ) 100 MG tablet  Attention deficit hyperactivity disorder (ADHD), combined type - Plan: lisdexamfetamine (VYVANSE ) 60 MG capsule, lisdexamfetamine (VYVANSE ) 60 MG capsule, lisdexamfetamine (VYVANSE ) 60 MG capsule  Restless legs syndrome - Plan: rOPINIRole  (REQUIP  XL) 4 MG 24 hr tablet, buPROPion  (WELLBUTRIN  XL) 150 MG 24 hr tablet  Insomnia due to mental condition - Plan: traZODone  (DESYREL ) 50 MG tablet  Tremor, essential  Binge-eating disorder, moderate  Moderate obstructive sleep apnea-hypopnea syndrome  30 min face to face time with patient . We discussed the following:  Disc various dx and  status of current med regimen.  depression and anxiety are managed.   Binge eating managed.  Wellbutrin  XL 150 mg AM LED bc stimulants.  Consisder alternative [place]  CONTINUE  Sertraline  100 per her request.  1 mg Xanax  DT QL.  We discussed the short-term risks associated with benzodiazepines including sedation and increased fall risk among others.  Discussed long-term side effect risk including dependence, potential withdrawal symptoms, and the potential eventual dose-related risk of dementia. Disc new studies refuting the link.  RLS not well controlled and having to split ropinirole  to tolerate Disc timing of meds.  Disc options but have failed multiple ones. Disc dosing and risk of going to high in dose and causing RLS.  Disc this extensively.   Disc new guidelines from American Society of sleep Medicine from July 2025 for RLS involving iron infusions:  Gave patient a handout to take to her PCP.   Ferritin 2016 was 117  ropinirole  ER 4 mg at evening meal prn  1 mg IR.  Stop afternoon caffeine. Reduced Coffee to 1 daily and Coke Zero 1-2 daily.  Needs to stop it DT RLS    Disc insomnia trazodone  helpful with Xanax  0.5  Disc mental health ris of untreated OSA.  Extensive discussion of adjustments and masks.  Disc her questions about ADD.  High inattentive score on questionaire. Push fluids.   continue Vyvanse  60 mg AM.  Discussed potential benefits, risks, and side effects of stimulants with patient to include increased heart rate, palpitations, insomnia, increased anxiety, increased irritability, or decreased appetite.  Instructed patient to contact office if experiencing any significant tolerability issues. Disc difference between brand, generics.  Disc this in depth.  Generic Vyanse ok  Call if worsening sx  FU 6 mos.  Lorene Macintosh, MD, DFAPA   Please see After Visit Summary for patient specific instructions.  No future appointments.    No orders of the defined  types were placed in this encounter.      -------------------------------

## 2024-01-05 ENCOUNTER — Other Ambulatory Visit: Payer: Self-pay | Admitting: Family Medicine

## 2024-01-05 DIAGNOSIS — Z1231 Encounter for screening mammogram for malignant neoplasm of breast: Secondary | ICD-10-CM

## 2024-01-07 ENCOUNTER — Ambulatory Visit

## 2024-02-17 ENCOUNTER — Ambulatory Visit
Admission: RE | Admit: 2024-02-17 | Discharge: 2024-02-17 | Disposition: A | Discharge status: Home / Self Care | Source: Ambulatory Visit | Attending: Family Medicine | Admitting: Family Medicine

## 2024-02-17 DIAGNOSIS — Z1231 Encounter for screening mammogram for malignant neoplasm of breast: Secondary | ICD-10-CM

## 2024-06-08 ENCOUNTER — Ambulatory Visit: Admitting: Psychiatry
